# Patient Record
Sex: Male | Born: 1965 | Race: White | Hispanic: No | Marital: Married | Smoking: Never smoker
Health system: Southern US, Community
[De-identification: ages and names within clinical notes are randomized; demographics above are authoritative.]

## PROBLEM LIST (undated history)

## (undated) DIAGNOSIS — E349 Endocrine disorder, unspecified: Secondary | ICD-10-CM

## (undated) DIAGNOSIS — M542 Cervicalgia: Secondary | ICD-10-CM

## (undated) DIAGNOSIS — M940 Chondrocostal junction syndrome [Tietze]: Secondary | ICD-10-CM

## (undated) DIAGNOSIS — M2550 Pain in unspecified joint: Secondary | ICD-10-CM

## (undated) DIAGNOSIS — M25519 Pain in unspecified shoulder: Principal | ICD-10-CM

## (undated) DIAGNOSIS — E785 Hyperlipidemia, unspecified: Secondary | ICD-10-CM

## (undated) DIAGNOSIS — K760 Fatty (change of) liver, not elsewhere classified: Secondary | ICD-10-CM

## (undated) DIAGNOSIS — G709 Myoneural disorder, unspecified: Secondary | ICD-10-CM

## (undated) DIAGNOSIS — R1013 Epigastric pain: Secondary | ICD-10-CM

## (undated) DIAGNOSIS — N200 Calculus of kidney: Secondary | ICD-10-CM

## (undated) DIAGNOSIS — M5030 Other cervical disc degeneration, unspecified cervical region: Secondary | ICD-10-CM

## (undated) DIAGNOSIS — R945 Abnormal results of liver function studies: Secondary | ICD-10-CM

## (undated) DIAGNOSIS — J310 Chronic rhinitis: Secondary | ICD-10-CM

## (undated) DIAGNOSIS — R0789 Other chest pain: Secondary | ICD-10-CM

## (undated) DIAGNOSIS — F438 Other reactions to severe stress: Secondary | ICD-10-CM

## (undated) DIAGNOSIS — R739 Hyperglycemia, unspecified: Secondary | ICD-10-CM

## (undated) DIAGNOSIS — R5383 Other fatigue: Secondary | ICD-10-CM

## (undated) DIAGNOSIS — T7840XA Allergy, unspecified, initial encounter: Secondary | ICD-10-CM

## (undated) DIAGNOSIS — R030 Elevated blood-pressure reading, without diagnosis of hypertension: Secondary | ICD-10-CM

## (undated) DIAGNOSIS — Z87442 Personal history of urinary calculi: Secondary | ICD-10-CM

## (undated) DIAGNOSIS — K219 Gastro-esophageal reflux disease without esophagitis: Secondary | ICD-10-CM

## (undated) DIAGNOSIS — R209 Unspecified disturbances of skin sensation: Secondary | ICD-10-CM

## (undated) DIAGNOSIS — Z0000 Encounter for general adult medical examination without abnormal findings: Secondary | ICD-10-CM

## (undated) HISTORY — DX: Chronic rhinitis: J31.0

## (undated) HISTORY — DX: Elevated blood-pressure reading, without diagnosis of hypertension: R03.0

## (undated) HISTORY — DX: Cervicalgia: M54.2

## (undated) HISTORY — DX: Pain in unspecified shoulder: M25.519

## (undated) HISTORY — DX: Calculus of kidney: N20.0

## (undated) HISTORY — DX: Endocrine disorder, unspecified: E34.9

## (undated) HISTORY — DX: Other chest pain: R07.89

## (undated) HISTORY — DX: Unspecified disturbances of skin sensation: R20.9

## (undated) HISTORY — DX: Chondrocostal junction syndrome (tietze): M94.0

## (undated) HISTORY — DX: Encounter for general adult medical examination without abnormal findings: Z00.00

## (undated) HISTORY — DX: Abnormal results of liver function studies: R94.5

## (undated) HISTORY — DX: Hyperglycemia, unspecified: R73.9

## (undated) HISTORY — PX: UPPER GASTROINTESTINAL ENDOSCOPY: SHX188

## (undated) HISTORY — DX: Epigastric pain: R10.13

## (undated) HISTORY — DX: Myoneural disorder, unspecified: G70.9

## (undated) HISTORY — DX: Hyperlipidemia, unspecified: E78.5

## (undated) HISTORY — DX: Fatty (change of) liver, not elsewhere classified: K76.0

## (undated) HISTORY — DX: Pain in unspecified joint: M25.50

## (undated) HISTORY — DX: Other cervical disc degeneration, unspecified cervical region: M50.30

## (undated) HISTORY — DX: Other fatigue: R53.83

## (undated) HISTORY — DX: Other reactions to severe stress: F43.8

## (undated) HISTORY — DX: Gastro-esophageal reflux disease without esophagitis: K21.9

## (undated) HISTORY — DX: Allergy, unspecified, initial encounter: T78.40XA

## (undated) HISTORY — PX: CARDIAC CATHETERIZATION: SHX172

---

## 2004-08-29 ENCOUNTER — Ambulatory Visit: Payer: Self-pay | Admitting: Family Medicine

## 2005-10-15 ENCOUNTER — Ambulatory Visit: Payer: Self-pay | Admitting: Family Medicine

## 2008-05-04 ENCOUNTER — Ambulatory Visit: Payer: Self-pay | Admitting: Diagnostic Radiology

## 2008-05-04 ENCOUNTER — Emergency Department (HOSPITAL_BASED_OUTPATIENT_CLINIC_OR_DEPARTMENT_OTHER): Admission: EM | Admit: 2008-05-04 | Discharge: 2008-05-04 | Payer: Self-pay | Admitting: Emergency Medicine

## 2008-05-04 DIAGNOSIS — R0789 Other chest pain: Secondary | ICD-10-CM

## 2008-05-04 HISTORY — DX: Other chest pain: R07.89

## 2008-05-18 ENCOUNTER — Ambulatory Visit: Payer: Self-pay | Admitting: Family Medicine

## 2008-05-22 LAB — CONVERTED CEMR LAB
AST: 31 units/L (ref 0–37)
Alkaline Phosphatase: 55 units/L (ref 39–117)
Total Bilirubin: 1.3 mg/dL — ABNORMAL HIGH (ref 0.3–1.2)
Total CHOL/HDL Ratio: 5
VLDL: 44.6 mg/dL — ABNORMAL HIGH (ref 0.0–40.0)

## 2008-07-12 DIAGNOSIS — M5030 Other cervical disc degeneration, unspecified cervical region: Secondary | ICD-10-CM | POA: Insufficient documentation

## 2008-07-12 HISTORY — DX: Other cervical disc degeneration, unspecified cervical region: M50.30

## 2008-07-17 ENCOUNTER — Ambulatory Visit: Payer: Self-pay | Admitting: Internal Medicine

## 2008-07-17 DIAGNOSIS — M25519 Pain in unspecified shoulder: Secondary | ICD-10-CM

## 2008-07-17 HISTORY — DX: Pain in unspecified shoulder: M25.519

## 2008-07-24 ENCOUNTER — Ambulatory Visit: Payer: Self-pay | Admitting: Family Medicine

## 2008-07-24 ENCOUNTER — Telehealth: Payer: Self-pay | Admitting: Family Medicine

## 2008-07-28 ENCOUNTER — Ambulatory Visit: Payer: Self-pay | Admitting: Family Medicine

## 2008-08-02 ENCOUNTER — Encounter: Payer: Self-pay | Admitting: Family Medicine

## 2008-08-02 ENCOUNTER — Encounter: Admission: RE | Admit: 2008-08-02 | Discharge: 2008-08-28 | Payer: Self-pay | Admitting: Family Medicine

## 2009-02-27 ENCOUNTER — Emergency Department (HOSPITAL_BASED_OUTPATIENT_CLINIC_OR_DEPARTMENT_OTHER): Admission: EM | Admit: 2009-02-27 | Discharge: 2009-02-27 | Payer: Self-pay | Admitting: Emergency Medicine

## 2009-02-27 ENCOUNTER — Ambulatory Visit: Payer: Self-pay | Admitting: Diagnostic Radiology

## 2009-06-19 ENCOUNTER — Telehealth: Payer: Self-pay | Admitting: Family Medicine

## 2009-09-15 ENCOUNTER — Encounter: Payer: Self-pay | Admitting: Emergency Medicine

## 2009-09-15 ENCOUNTER — Ambulatory Visit: Payer: Self-pay | Admitting: Diagnostic Radiology

## 2009-09-15 ENCOUNTER — Observation Stay (HOSPITAL_COMMUNITY): Admission: EM | Admit: 2009-09-15 | Discharge: 2009-09-16 | Payer: Self-pay | Admitting: Internal Medicine

## 2009-09-17 ENCOUNTER — Ambulatory Visit: Payer: Self-pay | Admitting: Family Medicine

## 2009-09-17 DIAGNOSIS — R079 Chest pain, unspecified: Secondary | ICD-10-CM

## 2009-09-21 ENCOUNTER — Telehealth: Payer: Self-pay | Admitting: Family Medicine

## 2009-09-26 ENCOUNTER — Ambulatory Visit: Payer: Self-pay | Admitting: Family Medicine

## 2009-09-26 ENCOUNTER — Telehealth: Payer: Self-pay | Admitting: Family Medicine

## 2009-09-26 DIAGNOSIS — F4389 Other reactions to severe stress: Secondary | ICD-10-CM

## 2009-09-26 DIAGNOSIS — J310 Chronic rhinitis: Secondary | ICD-10-CM | POA: Insufficient documentation

## 2009-09-26 DIAGNOSIS — M542 Cervicalgia: Secondary | ICD-10-CM

## 2009-09-26 DIAGNOSIS — F438 Other reactions to severe stress: Secondary | ICD-10-CM

## 2009-09-26 DIAGNOSIS — R1013 Epigastric pain: Secondary | ICD-10-CM

## 2009-09-26 HISTORY — DX: Other reactions to severe stress: F43.89

## 2009-09-26 HISTORY — DX: Chronic rhinitis: J31.0

## 2009-09-26 HISTORY — DX: Cervicalgia: M54.2

## 2009-09-26 HISTORY — DX: Epigastric pain: R10.13

## 2009-09-26 HISTORY — DX: Other reactions to severe stress: F43.8

## 2009-09-27 ENCOUNTER — Telehealth: Payer: Self-pay | Admitting: Family Medicine

## 2009-10-04 ENCOUNTER — Ambulatory Visit: Payer: Self-pay

## 2009-10-04 ENCOUNTER — Telehealth: Payer: Self-pay | Admitting: Family Medicine

## 2009-10-04 ENCOUNTER — Ambulatory Visit: Payer: Self-pay | Admitting: Cardiovascular Disease

## 2009-10-05 ENCOUNTER — Encounter: Payer: Self-pay | Admitting: Family Medicine

## 2009-10-08 ENCOUNTER — Encounter (INDEPENDENT_AMBULATORY_CARE_PROVIDER_SITE_OTHER): Payer: Self-pay | Admitting: *Deleted

## 2009-10-15 ENCOUNTER — Ambulatory Visit: Payer: Self-pay | Admitting: Family Medicine

## 2009-10-16 ENCOUNTER — Telehealth: Payer: Self-pay | Admitting: Gastroenterology

## 2009-10-25 ENCOUNTER — Telehealth: Payer: Self-pay | Admitting: Family Medicine

## 2009-10-26 ENCOUNTER — Encounter (INDEPENDENT_AMBULATORY_CARE_PROVIDER_SITE_OTHER): Payer: Self-pay | Admitting: *Deleted

## 2009-10-26 ENCOUNTER — Ambulatory Visit: Payer: Self-pay | Admitting: Gastroenterology

## 2009-10-26 ENCOUNTER — Telehealth: Payer: Self-pay | Admitting: Gastroenterology

## 2009-10-31 ENCOUNTER — Ambulatory Visit: Payer: Self-pay | Admitting: Gastroenterology

## 2009-11-01 ENCOUNTER — Ambulatory Visit (HOSPITAL_COMMUNITY): Admission: RE | Admit: 2009-11-01 | Discharge: 2009-11-01 | Payer: Self-pay | Admitting: Gastroenterology

## 2009-11-02 ENCOUNTER — Encounter: Payer: Self-pay | Admitting: Gastroenterology

## 2009-11-03 ENCOUNTER — Emergency Department (HOSPITAL_BASED_OUTPATIENT_CLINIC_OR_DEPARTMENT_OTHER): Admission: EM | Admit: 2009-11-03 | Discharge: 2009-11-03 | Payer: Self-pay | Admitting: Emergency Medicine

## 2009-11-03 ENCOUNTER — Ambulatory Visit: Payer: Self-pay | Admitting: Diagnostic Radiology

## 2009-11-06 ENCOUNTER — Telehealth (INDEPENDENT_AMBULATORY_CARE_PROVIDER_SITE_OTHER): Payer: Self-pay | Admitting: *Deleted

## 2009-11-07 ENCOUNTER — Telehealth: Payer: Self-pay | Admitting: Gastroenterology

## 2009-11-07 ENCOUNTER — Telehealth (INDEPENDENT_AMBULATORY_CARE_PROVIDER_SITE_OTHER): Payer: Self-pay | Admitting: *Deleted

## 2009-11-07 ENCOUNTER — Encounter (INDEPENDENT_AMBULATORY_CARE_PROVIDER_SITE_OTHER): Payer: Self-pay | Admitting: *Deleted

## 2009-11-08 ENCOUNTER — Ambulatory Visit: Payer: Self-pay | Admitting: Family Medicine

## 2009-11-22 ENCOUNTER — Telehealth: Payer: Self-pay | Admitting: Family Medicine

## 2009-11-22 DIAGNOSIS — R209 Unspecified disturbances of skin sensation: Secondary | ICD-10-CM | POA: Insufficient documentation

## 2009-11-22 HISTORY — DX: Unspecified disturbances of skin sensation: R20.9

## 2009-11-26 ENCOUNTER — Ambulatory Visit: Payer: Self-pay | Admitting: Psychology

## 2009-12-13 ENCOUNTER — Ambulatory Visit: Payer: Self-pay | Admitting: Psychology

## 2009-12-17 ENCOUNTER — Telehealth: Payer: Self-pay | Admitting: Cardiovascular Disease

## 2010-01-01 ENCOUNTER — Ambulatory Visit: Payer: Self-pay | Admitting: Cardiovascular Disease

## 2010-01-07 ENCOUNTER — Telehealth: Payer: Self-pay | Admitting: Family Medicine

## 2010-01-28 ENCOUNTER — Ambulatory Visit: Payer: Self-pay | Admitting: Family Medicine

## 2010-01-29 ENCOUNTER — Telehealth: Payer: Self-pay | Admitting: Family Medicine

## 2010-01-29 ENCOUNTER — Encounter: Payer: Self-pay | Admitting: Cardiovascular Disease

## 2010-01-30 ENCOUNTER — Encounter: Payer: Self-pay | Admitting: Family Medicine

## 2010-01-30 ENCOUNTER — Encounter: Payer: Self-pay | Admitting: Cardiovascular Disease

## 2010-01-31 LAB — CONVERTED CEMR LAB
Chloride: 99 meq/L (ref 96–112)
Creatinine, Ser: 1.01 mg/dL (ref 0.40–1.50)
INR: 1.09 (ref <1.50)
MCHC: 33.4 g/dL (ref 30.0–36.0)
Prothrombin Time: 14.3 s (ref 11.6–15.2)
RBC: 5.03 M/uL (ref 4.22–5.81)
WBC: 4.9 10*3/uL (ref 4.0–10.5)

## 2010-02-01 ENCOUNTER — Encounter: Payer: Self-pay | Admitting: Family Medicine

## 2010-02-01 ENCOUNTER — Encounter: Payer: Self-pay | Admitting: Cardiology

## 2010-02-04 ENCOUNTER — Telehealth (INDEPENDENT_AMBULATORY_CARE_PROVIDER_SITE_OTHER): Payer: Self-pay | Admitting: *Deleted

## 2010-02-05 ENCOUNTER — Telehealth: Payer: Self-pay | Admitting: Cardiovascular Disease

## 2010-02-13 ENCOUNTER — Encounter
Admission: RE | Admit: 2010-02-13 | Discharge: 2010-02-13 | Payer: Self-pay | Source: Home / Self Care | Attending: Neurology | Admitting: Neurology

## 2010-03-05 ENCOUNTER — Ambulatory Visit
Admission: RE | Admit: 2010-03-05 | Discharge: 2010-03-05 | Payer: Self-pay | Source: Home / Self Care | Attending: Family Medicine | Admitting: Family Medicine

## 2010-03-08 ENCOUNTER — Telehealth: Payer: Self-pay | Admitting: Cardiovascular Disease

## 2010-03-08 ENCOUNTER — Encounter: Payer: Self-pay | Admitting: Cardiovascular Disease

## 2010-03-12 ENCOUNTER — Encounter: Payer: Self-pay | Admitting: Family Medicine

## 2010-03-12 ENCOUNTER — Ambulatory Visit
Admission: RE | Admit: 2010-03-12 | Payer: Self-pay | Source: Home / Self Care | Attending: Cardiovascular Disease | Admitting: Cardiovascular Disease

## 2010-03-12 LAB — CONVERTED CEMR LAB
CO2: 29 meq/L (ref 19–32)
Chloride: 103 meq/L (ref 96–112)
Creatinine, Ser: 1.3 mg/dL (ref 0.40–1.50)
Hemoglobin: 14.9 g/dL (ref 13.0–17.0)
INR: 0.96 (ref <1.50)
MCHC: 34.9 g/dL (ref 30.0–36.0)
Potassium: 4.5 meq/L (ref 3.5–5.3)
RBC: 4.66 M/uL (ref 4.22–5.81)
Sodium: 141 meq/L (ref 135–145)
WBC: 5.3 10*3/uL (ref 4.0–10.5)

## 2010-03-14 ENCOUNTER — Encounter: Payer: Self-pay | Admitting: Family Medicine

## 2010-03-18 ENCOUNTER — Telehealth: Payer: Self-pay | Admitting: Cardiovascular Disease

## 2010-03-19 ENCOUNTER — Telehealth: Payer: Self-pay | Admitting: Cardiovascular Disease

## 2010-03-31 LAB — CONVERTED CEMR LAB
Basophils Relative: 0.9 % (ref 0.0–3.0)
Eosinophils Absolute: 0 10*3/uL (ref 0.0–0.7)
Eosinophils Relative: 0.8 % (ref 0.0–5.0)
H Pylori IgG: NEGATIVE
Lymphocytes Relative: 23.9 % (ref 12.0–46.0)
Neutrophils Relative %: 69 % (ref 43.0–77.0)
RBC: 4.32 M/uL (ref 4.22–5.81)
Sed Rate: 13 mm/hr (ref 0–22)
WBC: 5.8 10*3/uL (ref 4.5–10.5)

## 2010-04-02 NOTE — Assessment & Plan Note (Signed)
Summary: jaw pain and recent chest pain/dm   Vital Signs:  Patient profile:   45 year old male Height:      68 inches (172.72 cm) Weight:      210.31 pounds (95.60 kg) BMI:     32.09 O2 Sat:      96 % on Room air Temp:     98.8 degrees F (37.11 degrees C) oral Pulse rate:   91 / minute BP sitting:   128 / 90  (left arm) Cuff size:   regular  Vitals Entered By: Josph Macho RMA (September 26, 2009 3:00 PM)  O2 Flow:  Room air CC: chest discomfort radiates to sides then back, tingling in left arm and hand, Jaw pain (on left side) coming and going since Friday/ CF Is Patient Diabetic? No   History of Present Illness: Patient in today for reevaluation of atypical CP with pain at times into left jaw and left arm. Sense of globus in his throat and some mild congestion is noted. Has been having some coughing/sneezing fits. He has been to the hospital and in here to be seen over the past few weeks with other symptoms as well. He is very anxious and aware that he is worrying obsessively about every little symptom. He came in here with c/o upper abdominal/low chest wall pain. He reports the pain in chest wall is central to left sided, started after a sneezing fit and can come and go. He does not have associated symptoms simultaneously. He denies palpitations/SOB (although at times he has trouble drawing a full breath when he is worrying). He had an episode over a week ago where he felt a heaviness in his LUQ with some assoicated nausea and light headedness. It was a hot day and he had not eaten well. He ate and rested and then felt better so he went and played softball like he normally would and did fine. 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 He had cardiac enzymes and EKG studies done at [facility] which were all completely  normal. Patient is now worried about the fact that his Dad died at age 22 of a sudden heart attack and his Paternal uncle died at 28 years of age of an MI bother were smokers.  His other set of symptoms are musculoskeletal and related to some neck pain and bone spurs in his neck that have been giving trouble off and on for the past year. He has needed to be treated several times for spasm and neck pain with radicular symptoms down his left arm and he is having some of those symptoms. No fevers/chills/GI symptoms. He acknowledges he is worrying constantly about his health, his kids and is having trouble sleeping. HE   Current Medications (verified): 1)  Nitrostat 0.4 Mg Subl (Nitroglycerin) .Marland Kitchen.. 1 Tab Sl As Needed Cp If No Resolution After 5 Minutes Repeat, After 3 Doses If Not Resolution To Er  Allergies (verified): No Known Drug Allergies  Past History:  Past medical history reviewed for relevance to current acute and chronic problems. Social history (including risk factors) reviewed for relevance to current acute and chronic problems.  Social History: Reviewed history from 09/24/2006 and no changes required. Never Smoked Alcohol use-yes Drug use-no Married Regular exercise-no  Review of Systems  See HPI  Physical Exam  General:  Well-developed,well-nourished,in no acute distress; alert,appropriate and cooperative throughout examination Head:  Normocephalic and atraumatic without obvious abnormalities. Eyes:  No corneal or conjunctival inflammation noted. EOMI.  Ears:  External ear exam shows no significant lesions or deformities.  Otoscopic examination reveals clear canals, tympanic membranes are intact bilaterally without bulging, retraction, inflammation or discharge. Hearing is grossly normal bilaterally. Nose:  External nasal examination shows no deformity or inflammation. Nasal mucosa are pink and moist without lesions or exudates. Mouth:  Oral mucosa and oropharynx without  lesions or exudates.  Teeth in good repair. Neck:  No deformities, masses, or tenderness noted. Lungs:  Normal respiratory effort, chest expands symmetrically. Lungs are clear to auscultation, no crackles or wheezes. Heart:  Normal rate and regular rhythm. S1 and S2 normal without gallop, murmur, click, rub or other extra sounds. Abdomen:  Bowel sounds positive,abdomen soft and non-tender without masses, organomegaly or hernias noted. Msk:  No deformity or scoliosis noted of thoracic or lumbar spine.  Muscle spasm noted over b/l SCM muscles in neck L>R, but FROM maintained in neck and arms Pulses:  R and L carotid,radial,femoral,dorsalis pedis and posterior tibial pulses are full and equal bilaterally Extremities:  No clubbing, cyanosis, edema, or deformity noted with normal full range of motion of all joints.   Neurologic:  No cranial nerve deficits noted. Station and gait are normal. Plantar reflexes are down-going bilaterally. DTRs are symmetrical throughout. Sensory, motor and coordinative functions appear intact. Skin:  Intact without suspicious lesions or rashes Cervical Nodes:  No lymphadenopathy noted Psych:  Oriented X3, memory intact for recent and remote, normally interactive, and good eye contact.  Anxious   Impression & Recommendations:  Problem # 1:  CHEST PAIN (ICD-786.50)  Orders: TLB-CBC Platelet - w/Differential (85025-CBCD) TLB-TSH (Thyroid Stimulating Hormone) (84443-TSH) Atypical CP likely multifactorial and related to costochondritis and possibly GI. With that strong FH is encouraged to proceed with cardiac stress testing and referred to a Cardiologist for further consultation. He is asked to take a ECASA 81mg  daily until his work up is complete. If he has chest pain/pressure that is unremitting and especially if it has any associated symptoms such as SOB/palp/nausea/diaphoresis he is given SL NTG to use as directed. If the pain persists after 3 doses he is to go to the ER  for further evaluation. He is asked to avoid any heavy lifting or strenuous activity until his work up is complete.  Problem # 2:  NECK PAIN (ICD-723.1)  The following medications were removed from the medication list:    Aspirin 81 Mg Tabs (Aspirin) ..... Once daily He has long history may use some NSAIDs as needed infrequently alternate ice and heat and is given some Diazepam as a muscle relaxant to try if the neck and arm pain worsens.  Problem # 3:  CHRONIC RHINITIS (ICD-472.0) mild symptoms but if they worsen may want to try some Claritin OTC for a few days  Problem # 4:  ABDOMINAL PAIN, EPIGASTRIC (ICD-789.06)  Orders: TLB-Sedimentation Rate (ESR) (85652-ESR) TLB-H. Pylori Abs(Helicobacter Pylori) (86677-HELICO) Venipuncture (25427) Specimen Handling (06237) Start Ranitidine 150mg  once daily, eat a bland lowfat diet and use Maalox as needed if symptoms worsen  Problem # 5:  OTHER ACUTE REACTIONS TO STRESS (ICD-308.3) Patient very anxious secondary to his vague and varied symptoms and his family history. He is offered some reassurance today that his symptoms do not present as classically cardiac but also warned not to dismiss symptoms  or to stubbornly avoid the ER because he is worried about the money, time or hassle. He is to try and use the Diazepam as needed for anxiety, insomnia or pain to see if this helps. He expresses understanding and chooses to go home and proceed with out patient work up unless his symptoms change or worsen. If this occurs he will proceed to the ER, Time spent with the patient is greater than 1 hour. Time counselling is greater than 45 minutes.  Complete Medication List: 1)  Nitrostat 0.4 Mg Subl (Nitroglycerin) .Marland Kitchen.. 1 tab sl as needed cp if no resolution after 5 minutes repeat, after 3 doses if not resolution to er 2)  Diazepam 5 Mg Tabs (Diazepam) .Marland Kitchen.. 1 tab by mouth two times a day as needed anxiety/pain/insomnia  Other Orders: Cardiology Referral  (Cardiology)  Patient Instructions: 1)  Please schedule a follow-up appointment as needed .  2)  Avoid any heavy lifting or strenuous activity until stress testing complete. 3)  Use SL NTG for CP that is unremitting especially if it is associated with SOB/nausea/sweatiness or palpitations. chew 4 baby aspirin 4)  Avoid caffeine. 5)  Use Diazepam for stress/pain/insomnia. 6)  Take Ranitidine 150mg  daily until lab results and stress test available. 7)  Take Aspirin 81mg  once daily 8)  Keep Maalox in house take a dose if burning in the chest or dyspepsia develops Prescriptions: DIAZEPAM 5 MG TABS (DIAZEPAM) 1 tab by mouth two times a day as needed anxiety/pain/insomnia  #30 x 1   Entered and Authorized by:   Danise Edge MD   Signed by:   Danise Edge MD on 09/26/2009   Method used:   Print then Give to Patient   RxID:   [id number] NITROSTAT 0.4 MG SUBL (NITROGLYCERIN) 1 tab sl as needed CP if no resolution after 5 minutes repeat, after 3 doses if not resolution to ER  #25 x 1   Entered and Authorized by:   Danise Edge MD   Signed by:   Danise Edge MD on 09/26/2009   Method used:   Electronically to        [facility]* (retail)             [place], [place]         Ph: [phone]       Fax: [phone]   RxID:   [id number]

## 2010-04-02 NOTE — Progress Notes (Signed)
Summary: PT HAS QUESTIONS ABOUT ED VISIT AND POSSIBLE CATH  Phone Note Call from Patient Call back at Work Phone [phone]   Caller: PT Reason for Call: Talk to Nurse Summary of Call: PT WAS SEEN IN ED OVER WEEKEND FOR CHEST PAIN AND HAD A CT WITH DYE DONE. WHEN HE WAS DISCHARGED HE WAS TOLD THAT HE NEEDED TO HAVE A CARDIAC CATH. Initial call taken by: Faythe Ghee,  November 06, 2009 3:54 PM  Follow-up for Phone Call        Pt was recently in ED Sept.3,11 and was ruled out for MI.  He had played softball earlier and was hit in the ankle with a line drive then his 17yr old son fell off the top of the bleachers.  His pectoral muscles per patients were aching and bp up.  He went to the ED after midnight.  He is seeing Dr. Tawanna Cooler tomorrow but is not sure if the  next step to the now "6 week  muscle discomfort" might be to have a cath.  He has had a stress test 10/04/09 with Dr. Clifton Delani Kohli reading.  He wants to know what Dr. Clifton Janavia Rottman thoughts are.  I will send this to Dr. Clifton Micheil Klaus & Leotis Shames. Mylo Red RN     Appended Document: PT HAS QUESTIONS ABOUT ED VISIT AND POSSIBLE CATH He is seeing Dr. Tawanna Cooler tomorrow. CT angiogram in ER was negative for PE. He has had chest pain. I have never seen him as a patient. He was sent in for a treadmill stress test which I supervised and it was normal. I will be glad to see him if he needs to be seen. Can we call him and let him to know to call us if he needs to be seen as a new patient or if Dr. Tawanna Cooler wants [place] to see him. thanks, chris  Appended Document: PT HAS QUESTIONS ABOUT ED VISIT AND POSSIBLE CATH Spoke with pt's wife and she will call us after his appt. with Dr. Tawanna Cooler to schedule an appt. with Dr. Clifton Ashani Pumphrey if necessary

## 2010-04-02 NOTE — Assessment & Plan Note (Signed)
Summary: 3 month fu/dt   Vital Signs:  Patient profile:   45 year old male Height:      68 inches (172.72 cm) Weight:      199.50 pounds (90.68 kg) O2 Sat:      100 % on Room air Temp:     97.8 degrees F (36.56 degrees C) oral Pulse rate:   72 / minute BP sitting:   136 / 85  (left arm) Cuff size:   regular  Vitals Entered By: Josph Macho RMA (January 28, 2010 3:52 PM)  O2 Flow:  Room air CC: 3 month follow up/ CF Is Patient Diabetic? No   History of Present Illness: Patient is 45 yo caucasian male in today for evaluation of multiple concerns. He he struggled with atypical chest pain. The pain is largely substernal and just to the left of the sternum and then will at times radiate to the left shoulder around the left can be improved and worsened by changes of position. He has tried multiple modalities to evaluate this pain is gone through gastroenterology and his upper endoscopy was on the Feldene had a normal stress test and offered a cardiac catheter for has not proceeded. Does not have the associated symptoms of palpitations, shortness of breath, nausea or diaphoresis with this chest pain. Says at times with substernal chest discomfort can radiate around bilateral lower meds he is going to massage the year he is in deep tissue massage in the anterior chest wall has been uncomfortable but also helpful. He has been some inflammation and swelling at times at the left sternal border for the patient. His acupuncture, chiropractic care and none of these have helped tremendously except for the massage therapy. A new development has been some paresthesias that are intermittent and diffuse. He says he'll get left base greater than right face tingling then at other times he'll get some arm tingling right greater than left with some leg tingling left greater than right he is in here to have it randomly they're not altered by changes in position. Is on some low-grade headaches but nothing  debilitating. He's had some episodes of his vision being blurry only temporarily may try 3. An episode of viral gastroenteritis earlier in the month but he had fever loose stool and vomiting for 24 hours which resolved. The rest of his symptoms have been ongoing for roughly 4 months. He is seeing Dr. Lesia Sago of [facility] neurology ordered an MRI to evaluate for MS another neurologic concerns. Previous neck x-ray did show some degenerative disc disease with potential serious disease on the left. His stress level is tolerable and not worse.   Current Medications (verified): 1)  Nitrostat 0.4 Mg Subl (Nitroglycerin) .Marland Kitchen.. 1 Tab Sl As Needed Cp If No Resolution After 5 Minutes Repeat, After 3 Doses If Not Resolution To Er 2)  Diazepam 5 Mg Tabs (Diazepam) .Marland Kitchen.. 1 Tab By Mouth Two Times A Day As Needed Anxiety/pain/insomnia 3)  Ultram 50 Mg Tabs (Tramadol Hcl) .... One By Mouth Two Times A Day As Needed For Pain 4)  Maalox Plus 225-200-25 Mg/58ml Susp (Alum & Mag Hydroxide-Simeth) .... 2 Teaspoons As Needed 5)  Clidinium-Chlordiazepoxide 2.5-5 Mg Caps (Clidinium-Chlordiazepoxide) .Marland Kitchen.. 1 By Mouth Three Times A Day As Needed 6)  Fish Oil   Oil (Fish Oil) .... 1000mg  2 Two Times A Day (When Remembers)  Allergies (verified): No Known Drug Allergies  Past History:  Past medical history reviewed for relevance to current acute and chronic  problems. Social history (including risk factors) reviewed for relevance to current acute and chronic problems.  Past Medical History: Reviewed history from 01/01/2010 and no changes required. Kidney Stones ? GERD Chest Wall Pain  Social History: Reviewed history from 01/01/2010 and no changes required. Never Smoked Alcohol use-yes-1-2 x per wek Drug use-no Married, 2 children Regular exercise-no Daily Caffeine Use-1  cup daily  Review of Systems      See HPI  Physical Exam  General:  Well-developed,well-nourished,in no acute distress; alert,appropriate  and cooperative throughout examination Head:  Normocephalic and atraumatic without obvious abnormalities. No apparent alopecia or balding. Eyes:  No corneal or conjunctival inflammation noted. EOMI. Perrla. Funduscopic exam benign, without hemorrhages, exudates or papilledema. Vision grossly normal. Ears:  External ear exam shows no significant lesions or deformities.  Otoscopic examination reveals clear canals, tympanic membranes are intact bilaterally without bulging, retraction, inflammation or discharge. Hearing is grossly normal bilaterally. Nose:  External nasal examination shows no deformity or inflammation. Nasal mucosa are pink and moist without lesions or exudates. Mouth:  Oral mucosa and oropharynx without lesions or exudates.  Teeth in good repair, pharyngeal erythema.   Neck:  No deformities, masses, or tenderness noted. Chest Wall:  No deformities, masses, tenderness or gynecomastia noted. Lungs:  Normal respiratory effort, chest expands symmetrically. Lungs are clear to auscultation, no crackles or wheezes. Heart:  Normal rate and regular rhythm. S1 and S2 normal without gallop, murmur, click, rub or other extra sounds. Abdomen:  Bowel sounds positive,abdomen soft and non-tender without masses, organomegaly or hernias noted. Msk:  No deformity or scoliosis noted of thoracic or lumbar spine.   Extremities:  No clubbing, cyanosis, edema, or deformity noted  Cervical Nodes:  No lymphadenopathy noted Psych:  Cognition and judgment appear intact. Alert and cooperative with normal attention span and concentration. No apparent delusions, illusions, hallucinations   Impression & Recommendations:  Problem # 1:  CHEST PAIN, ATYPICAL (ICD-786.59) So far cardiac and GI work up is unremarkable, due to the persistent substernal to left sided CP he has been offered a cardiac cath for complete evaluation but thus far has not chosen to proceed with this option.   Problem # 2:  DEGENERATIVE DISC  DISEASE, CERVICAL SPINE (ICD-722.4) Is now seeing Neurology and is dealing with some left sided CP which radiates to left shoulder and back there is some concern that with this as well as some newly developed diffuse paresthesias. They are in the process of ordering a MRI to rule out MS and other neurologic concerns and then will likely proceed with some lab work if need be we can facilitate the ordering of some labs as well  Problem # 3:  OTHER ACUTE REACTIONS TO STRESS (ICD-308.3) Has Diazepam to use as needed and is using very infrequently, he continues to struggle with work stress but feels he has good insight into this and good coping skills.  Problem # 4:  NECK PAIN (ICD-723.1)  His updated medication list for this problem includes:    Ultram 50 Mg Tabs (Tramadol hcl) ..... One by mouth two times a day as needed for pain    Cyclobenzaprine Hcl 10 Mg Tabs (Cyclobenzaprine hcl) .Marland Kitchen... 1/2 to 1 tab by mouth at bedtime May try the Cyclobenzaprine at bedtime to help with muscle spasm  Complete Medication List: 1)  Nitrostat 0.4 Mg Subl (Nitroglycerin) .Marland Kitchen.. 1 tab sl as needed cp if no resolution after 5 minutes repeat, after 3 doses if not resolution to er 2)  Diazepam 5 Mg Tabs (Diazepam) .Marland Kitchen.. 1 tab by mouth two times a day as needed anxiety/pain/insomnia 3)  Ultram 50 Mg Tabs (Tramadol hcl) .... One by mouth two times a day as needed for pain 4)  Maalox Plus 225-200-25 Mg/71ml Susp (Alum & mag hydroxide-simeth) .... 2 teaspoons as needed 5)  Clidinium-chlordiazepoxide 2.5-5 Mg Caps (Clidinium-chlordiazepoxide) .Marland Kitchen.. 1 by mouth three times a day as needed 6)  Fish Oil Oil (Fish oil) .... 1000mg  2 two times a day (when remembers) 7)  Cyclobenzaprine Hcl 10 Mg Tabs (Cyclobenzaprine hcl) .... 1/2 to 1 tab by mouth at bedtime 8)  Aspercreme/aloe 10 % Crea (Trolamine salicylate) .... Apply to chest wall as needed for pain  Patient Instructions: 1)  The main problem with gastroentereritis is  dehydration. Drink plenty of fluids and take solids as you feel better. If you are unable to keep anything down and/or you show signs of dehydration( dry cracked lips, lack of tears, not urinating, very sleepy) , call our office.  2)  Watch spicy, acidic, fatty and large meals especially in evening 3)  Try Pepcid/Famotidine in eve if need be 4)  Please schedule a follow-up appointment in 1 month.  Prescriptions: CYCLOBENZAPRINE HCL 10 MG TABS (CYCLOBENZAPRINE HCL) 1/2 to 1 tab by mouth at bedtime  #30 x 1   Entered and Authorized by:   Danise Edge MD   Signed by:   Danise Edge MD on 01/28/2010   Method used:   Electronically to        [facility] # [phone]* (retail)       [address]       [place], [place]  [address]       Ph: [phone]       Fax: [phone]   RxID:   [id number]    Orders Added: 1)  Est. Patient Level IV [60109]

## 2010-04-02 NOTE — Progress Notes (Signed)
Summary: discuss heart cath  Phone Note Call from Patient Call back at Home Phone [phone] Call back at Work Phone [phone]   Caller: Patient Reason for Call: Talk to Nurse Details for Reason: Discuss heart cath. Initial call taken by: Lorne Skeens,  December 17, 2009 11:53 AM  Follow-up for Phone Call        Pt. still having CP with activity & with rest. He is rating it around 3-4 but sometimes 7/10. He is wondering if he should have a cardiac cath. Seems very stressed right now. He has been having CP on & off since July, 2011. Had gxt in September which was negative. He was recently in the ER in September for CP & ER doctors had suggested a cath to determine source of CP. His primary does not feel that this is necessary.?? Pt. wants to know if he should have cardiac cath since he is still having CP. Will forward to [facility] to see if he wants to see him in the office. Whitney Maeola Sarah RN  December 17, 2009 12:46 PM  Appt made for next week with [facility]. Whitney Maeola Sarah RN  December 18, 2009 11:19 AM   Follow-up by: Whitney Maeola Sarah RN,  December 17, 2009 12:42 PM  Additional Follow-up for Phone Call Additional follow up Details #1::        I have not seen him as a paitent. He was seen in our office for his exercise treadmill several months ago. If he still has chest pain, I would be glad to see him as a new patient. Can we call him and see if he needs to be seen? thanks, cdm Additional Follow-up by: Verne Carrow, MD,  December 18, 2009 9:48 AM

## 2010-04-02 NOTE — Assessment & Plan Note (Signed)
Summary: Epigastric Pain/dfs   History of Present Illness Visit Type: Initial Visit Primary Provider: Kelle Darting, MD Chief Complaint: Patient c/o LUQ abdominal pain  as well as midsternal chest discomfort and increased bloating. He denies any nausaea or vomiting. He denies any severe diarrhea but does have occasional loose stools (which he attributes to prilosec) History of Present Illness:   45 year old Caucasian male who is referred by Dr. Tawanna Cooler for evaluation of atypical chest pain. 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 In early July he developed a dull discomfort in his subxiphoid area radiating into the left upper quadrant and back which has been rather persistent despite a ten-day course of Prilosec 20 mg b.i.d. His pain was so bad that he had emergency room evaluation and underwent 24-hour observation, subsequent cardiac stress testing, and a chest x-ray and labs all of which were normal.  He continues with atypical chest pain without true reflux symptoms. He specifically denies regurgitation, dysphagia. Runny hepatobiliary or lower gastrointestinal symptoms. He eats very irregularly and eats a high fat diet. He has not had previous hepatitis or pancreatitis. He does use a large amount of NSAIDs for his neck pain, but denies ethanol or cigarette abuse. He takes currently p.r.n. diazepam and Ultram. If under stress he has abdominal cramping, bloating, and watery stools but no melena or hematochezia. He has not had anemia or abnormal liver enzymes.   GI Review of Systems    Reports abdominal pain, acid reflux, belching, bloating, and  chest pain.     Location of  Abdominal pain: LUQ.    Denies dysphagia with liquids, dysphagia with solids, heartburn, loss of appetite, nausea, vomiting, vomiting blood, weight loss, and  weight gain.        Denies  anal fissure, black tarry stools, change in bowel habit, constipation, diarrhea, diverticulosis, fecal incontinence, heme positive stool, hemorrhoids, irritable bowel syndrome, jaundice, light color stool, liver problems, rectal bleeding, and  rectal pain.    Current Medications (verified): 1)  Nitrostat 0.4 Mg Subl (Nitroglycerin) .Marland Kitchen.. 1 Tab Sl As Needed Cp If No Resolution After 5 Minutes Repeat, After 3 Doses If Not Resolution To Er 2)  Diazepam 5 Mg Tabs (Diazepam) .Marland Kitchen.. 1 Tab By Mouth Two Times A Day As Needed Anxiety/pain/insomnia 3)  Ultram 50 Mg Tabs (Tramadol Hcl) .... One By Mouth Two Times A Day As Needed For Pain 4)  Maalox Plus 225-200-25 Mg/1ml Susp (Alum & Mag Hydroxide-Simeth) .... 2 Teaspoons As Needed 5)  Zantac 150 Mg Tabs (Ranitidine Hcl) .... As Needed 6)  Prilosec Otc 20 Mg Tbec (Omeprazole Magnesium) .... Take 1 Tablet By Mouth As Needed  Allergies (verified): No Known Drug Allergies  Past History:  Past medical, surgical, family and social histories (including risk factors) reviewed for relevance to current acute and chronic problems.  Past Medical History: Kidney Stones GERD Chest Wall Pain  Past Surgical History: unremarkable  Family History: Reviewed history from 09/24/2006 and no changes required. Family History Diabetes 1st degree relative: Mother, Maternal Grandmother, Paternal Grandmother, Grandfather Family History Hypertension FAm hx MI Fam hx IBS Family History of Cardiovascular disorder Family History of Esophageal Cancer: Mudlogger Family History of Stomach Cancer: Uncle No FH of Colon Cancer: Family History of Colon Polyps: Grandmother Family History of Colitis: Maternal Grandmother, Maternal Grandfather Family History of Heart Disease: Maternal  Grandmother, Maternal Grandfather  Social History: Reviewed history from 09/24/2006 and no changes required. Never Smoked Alcohol use-yes-1-2 x per wek Drug use-no Married Regular  exercise-no Daily Caffeine Use-1` cup daily  Review of Systems       The patient complains of anxiety-new, back pain, cough, fatigue, headaches-new, muscle pains/cramps, and sore throat.  The patient denies allergy/sinus, anemia, arthritis/joint pain, blood in urine, breast changes/lumps, change in vision, confusion, coughing up blood, depression-new, fainting, fever, hearing problems, heart murmur, heart rhythm changes, itching, menstrual pain, night sweats, nosebleeds, pregnancy symptoms, shortness of breath, skin rash, sleeping problems, swelling of feet/legs, swollen lymph glands, thirst - excessive , urination - excessive , urination changes/pain, urine leakage, vision changes, and voice change.    Vital Signs:  Patient profile:   45 year old male Height:      68 inches Weight:      198.13 pounds BMI:     30.23 BSA:     2.04 Pulse rate:   68 / minute Pulse rhythm:   regular BP sitting:   120 / 78  (left arm) Cuff size:   large  Vitals Entered By: Lamona Curl CMA [name]) (October 26, 2009 11:06 AM)  Physical Exam  General:  Well developed, well nourished, no acute distress.healthy appearing.   Head:  Normocephalic and atraumatic. Eyes:  PERRLA, no icterus.exam deferred to patient's ophthalmologist.   Neck:  Supple; no masses or thyromegaly. Lungs:  Clear throughout to auscultation. Heart:  Regular rate and rhythm; no murmurs, rubs,  or bruits. Abdomen:  Soft, nontender and nondistended. No masses, hepatosplenomegaly or hernias noted. Normal bowel sounds. Msk:  Symmetrical with no gross deformities. Normal posture. Extremities:  No clubbing, cyanosis, edema or deformities noted. Neurologic:  Alert and  oriented x4;  grossly normal neurologically. Cervical Nodes:  No significant cervical adenopathy. Axillary Nodes:  No significant axillary adenopathy. Inguinal Nodes:  No significant inguinal adenopathy. Psych:  Alert and cooperative. Normal mood and affect.   Impression  & Recommendations:  Problem # 1:  CHEST PAIN (ICD-786.50) Assessment Unchanged Probable atypical chest pain related to acid reflux and hiatal hernia. I have scheduled him for ultrasonography and endoscopy. I have changed him to AcipHex 20 mg twice a day with standard anti-reflex precautions and dietary restrictions. I also placed him on Librax 3 times a day before meals for his IBS complaints. He may need esophageal manometry and 24-hour pH probe testing. Our patient education video which showed to the patient and his wife today. His wife is present throughout the interview and exam.At the Time of endoscopy we'll check him for H. pylori.  Problem # 2:  NECK PAIN (ICD-723.1) Assessment: Unchanged Advised to avoid NSAIDs and use p.r.n. tramadol till workup completed.  Patient Instructions: 1)  Copy sent to : Dr. Alonza Smoker 2)  Please continue current medications.  3)  Avoid foods high in acid content ( tomatoes, citrus juices, spicy foods) . Avoid eating within 3 to 4 hours of lying down or before exercising. Do not over eat; try smaller more frequent meals. Elevate head of bed four inches when sleeping.  4)  Conscious Sedation brochure given.  5)  Upper Endoscopy brochure given.   6)  ultrasound scheduled. 7)  Change to AcipHex 20 mg twice a day and p.r.n. Librax t.i.d. before meals 8)  GI Reflux and Hiatal Hernia brochure given.   Appended Document: Epigastric Pain/dfs    Clinical Lists Changes  Medications: Added new medication of CLIDINIUM-CHLORDIAZEPOXIDE 2.5-5 MG  CAPS (CLIDINIUM-CHLORDIAZEPOXIDE) 1 by mouth three times a day as needed - Signed Added new medication of ACIPHEX 20 MG  TBEC (RABEPRAZOLE SODIUM) 1 by mouth two times a day - Signed Rx of CLIDINIUM-CHLORDIAZEPOXIDE 2.5-5 MG CAPS (CLIDINIUM-CHLORDIAZEPOXIDE) 1 by mouth three times a day as needed;  #90 x 3;  Signed;  Entered by: Ashok Cordia RN;  Authorized by: Mardella Layman MD [employer];  Method used: Electronically to  [facility] # [address] [place], [place]  [address], Ph: [phone], Fax: [phone] Rx of ACIPHEX 20 MG  TBEC (RABEPRAZOLE SODIUM) 1 by mouth two times a day;  #60 x 6;  Signed;  Entered by: Ashok Cordia RN;  Authorized by: Mardella Layman MD [facility];  Method used: Electronically to [facility] # [address] [place], [place]  [address], Ph: [phone], Fax: [phone] Orders: Added new Test order of EGD (EGD) - Signed Added new Test order of Ultrasound Abdomen (UAS) - Signed    Prescriptions: ACIPHEX 20 MG  TBEC (RABEPRAZOLE SODIUM) 1 by mouth two times a day  #60 x 6   Entered by:   Ashok Cordia RN   Authorized by:   Mardella Layman MD [facility]   Signed by:   Ashok Cordia RN on 10/26/2009   Method used:   Electronically to        [facility] [id number]* (retail)       [address]       [place], [place]  [address]       Ph: [phone]       Fax: [phone]   RxID:   [id number] CLIDINIUM-CHLORDIAZEPOXIDE 2.5-5 MG CAPS (CLIDINIUM-CHLORDIAZEPOXIDE) 1 by mouth three times a day as needed  #90 x 3   Entered by:   Ashok Cordia RN   Authorized by:   Mardella Layman MD [facility]   Signed by:   Ashok Cordia RN on 10/26/2009   Method used:   Electronically to        [facility] # [id number]* (retail)       [address]       [place], [place]  [address]       Ph: [phone]       Fax: [phone]   RxID:   [id number]

## 2010-04-02 NOTE — Miscellaneous (Signed)
  Clinical Lists Changes  Orders: Added new Test order of Mano/24 hour pH Probe (Mano/24 pH) - Signed

## 2010-04-02 NOTE — Letter (Signed)
Summary: Cardiac Catheterization Instructions- [facility]  [facility], Main Office  [address]   [place], [place] [address]   Phone: [phone]  Fax: [phone]     01/29/2010 MRN: [id number]  Casey Miles [address] [place], [place]  [address]  Dear [name],   You are scheduled for a Cardiac Catheterization on _12/8/11_ with Dr.McAlhany.  Please arrive to the 1st floor of the Heart and Vascular Center at [facility] at 6:30 am  on the day of your procedure. Please do not arrive before 6:30 a.m. Call the Heart and Vascular Center at [phone] if you are unable to make your appointmnet. The Code to get into the parking garage under the building is 0003. Take the elevators to the 1st floor. You must have someone to drive you home. Someone must be with you for the first 24 hours after you arrive home. Please wear clothes that are easy to get on and off and wear slip-on shoes. Do not eat or drink after midnight except water with your medications that morning. Bring all your medications and current insurance cards with you.  __x_ You may take ALL of your medications with water that morning.  The usual length of stay after your procedure is 2 to 3 hours. This can vary.  If you have any questions, please call the office at the number listed above.   Whitney Maeola Sarah RN

## 2010-04-02 NOTE — Progress Notes (Signed)
Summary: neurology referral  Phone Note Call from Patient Call back at Home Phone [phone] Call back at Work Phone [phone]   Caller: Patient Call For: Roderick Pee MD Summary of Call: Pt has developed a "burning externally on chest"  Feels like it is under the skin. Is requesting a referral to neurology as he is having some isolated numbness, burning, and pain thoughout his entire body.  Initial call taken by: Lynann Beaver CMA,  November 22, 2009 9:14 AM  Follow-up for Phone Call        ok  Follow-up by: Roderick Pee MD,  November 22, 2009 11:25 AM  Additional Follow-up for Phone Call Additional follow up Details #1::        Pt. notified. Additional Follow-up by: Lynann Beaver CMA,  November 22, 2009 1:17 PM  New Problems: PARESTHESIA (ICD-782.0)   New Problems: PARESTHESIA (ICD-782.0)

## 2010-04-02 NOTE — Procedures (Signed)
Summary: Upper Endoscopy  Patient: Casey Miles Note: All result statuses are Final unless otherwise noted.  Tests: (1) Upper Endoscopy (EGD)   EGD Upper Endoscopy       DONE     [facility]     [address]     [place], [place]  [address]           ENDOSCOPY PROCEDURE REPORT           PATIENT:  Casey, Miles  MR#:  [id number]     BIRTHDATE:  [birth year], 44 yrs. old  GENDER:  male           ENDOSCOPIST:  Vania Rea. Jarold Motto, MD, [facility]     Referred by:           PROCEDURE DATE:  10/31/2009     PROCEDURE:  EGD with biopsy     ASA CLASS:  Class I     INDICATIONS:  ATYPICAL CHEST PAIN.           MEDICATIONS:   Fentanyl 50 mcg IV, Versed 5 mg IV     TOPICAL ANESTHETIC:  Exactacain Spray           DESCRIPTION OF PROCEDURE:   After the risks benefits and     alternatives of the procedure were thoroughly explained, informed     consent was obtained.  The [facility] [id number] endoscope was     introduced through the mouth and advanced to the second portion of     the duodenum, without limitations.  The instrument was slowly     withdrawn as the mucosa was fully examined.     <<PROCEDUREIMAGES>>           The upper, middle, and distal third of the esophagus were     carefully inspected and no abnormalities were noted. The z-line     was well seen at the GEJ. The endoscope was pushed into the fundus     which was normal including a retroflexed view. The antrum,gastric     body, first and second part of the duodenum were unremarkable.     ESOPHAGEAL BIOPSIES DONE,CLO BX. DONE.  Abnormal appearing mucosa.     SMALL INLET POUCH OF ECTOPIC GASTRIC MUCOSA BIOPSIED #2 IN     PROXIMAL ESOPHAGITIS.    Retroflexed views revealed no     abnormalities.    The scope was then withdrawn from the patient     and the procedure completed.           COMPLICATIONS:  None           ENDOSCOPIC IMPRESSION:     1) Normal EGD     1.NO EROSIVE ESOPHAGITIS OR HIATIAL HERNIA NOTED.BIOPSIES DONE  TO R/O EOSINOPHILIC ESOPHAGITIS.     2.SMALL PROXIMAL ESOPHAGEAL FOCUS OF ECTOPIC GASTRIC MUCOSA.??     RELEVANCE.     3.CLO BX. DONE.R/O H.PYLORI.     RECOMMENDATIONS:     1) Await biopsy results     2) Rx CLO if positive     3) continue PPI     ULTRASOUND EXAM PENDING.DEPENDING ON WORKUP RESULTS,HE MAY NEED     MANOMETRY AND 24H PH PROBE TESTING.           REPEAT EXAM:  No           ______________________________     Vania Rea. Jarold Motto, MD, [facility]           CC:  Eugenio Hoes  Tawanna Cooler, MD           n.     Rosalie DoctorVania Rea. Patterson at 10/31/2009 10:05 AM           [place], [phone]  Note: An exclamation Jhonny (!) indicates a result that was not dispersed into the flowsheet. Document Creation Date: 10/31/2009 10:06 AM _______________________________________________________________________  (1) Order result status: Final Collection or observation date-time: 10/31/2009 09:53 Requested date-time:  Receipt date-time:  Reported date-time:  Referring Physician:   Ordering Physician: Sheryn Bison [id number]) Specimen Source:  Source: Launa Grill Order Number: [id number] Lab site:

## 2010-04-02 NOTE — Miscellaneous (Signed)
Summary: Orders Update   Clinical Lists Changes  Orders: Added new Referral order of Gastroenterology Referral (GI) - Signed 

## 2010-04-02 NOTE — Progress Notes (Signed)
Summary: Epigastric Pain -sooner appt  Phone Note From Other Clinic   Caller: [facility] @ Dr Tawanna Cooler [phone] Call For: Dr Jarold Motto Reason for Call: Schedule Patient Appt Summary of Call: Would like pt seen sooner than appt scheduled on 11-15-09. Dr Tawanna Cooler requesting next wk or withing 10 days for epigastric abd pain. Initial call taken by: Leanor Kail [facility],  October 16, 2009 8:57 AM  Follow-up for Phone Call        Appt sch for pt to see Dr. Jarold Motto on 10/26/09.  Terry  notified.  She will contact pt. Follow-up by: Ashok Cordia RN,  October 16, 2009 2:20 PM

## 2010-04-02 NOTE — Miscellaneous (Signed)
Summary: Orders Update  Clinical Lists Changes  Orders: Added new Test order of TLB-H Pylori Screen Gastric Biopsy (83013-CLOTEST) - Signed 

## 2010-04-02 NOTE — Assessment & Plan Note (Signed)
Summary: follow up from er/cjr   Vital Signs:  Patient profile:   45 year old male Weight:      213 pounds Temp:     98.5 degrees F oral BP sitting:   130 / 90  (left arm) Cuff size:   regular  Vitals Entered By: Kern Reap CMA [name]) (September 17, 2009 3:33 PM)  History of Present Illness: Casey Miles is a 45 year old male, nonsmoker, who comes in today following a hospital stay overnight from 716 two 717 for evaluation of atypical chest pain.  He presented to emerge room with a 3-day history of discomfort in the left side of his chest.  He points to the left side of his chest.  Low the nipple in the midclavicular line.  He states it comes and does sometimes dull, sometimes it's radiating to his back.  He describes no cardiac no pulmonary symptoms.  It typically occurs when he is sitting and is relieved by lying on his stomach.  Again, no cardiac no pulmonary symptoms, and complete cardiac evaluation.  The hospital, was negative  Allergies: No Known Drug Allergies  Past History:  Past medical, surgical, family and social histories (including risk factors) reviewed, and no changes noted (except as noted below).  Family History: Reviewed history from 09/24/2006 and no changes required. Family History Diabetes 1st degree relative Family History Hypertension FAm hx MI Fam hx IBS Family History of Cardiovascular disorder  Social History: Reviewed history from 09/24/2006 and no changes required. Never Smoked Alcohol use-yes Drug use-no Married Regular exercise-no  Review of Systems      See HPI  Physical Exam  General:  Well-developed,well-nourished,in no acute distress; alert,appropriate and cooperative throughout examination Head:  Normocephalic and atraumatic without obvious abnormalities. No apparent alopecia or balding. Eyes:  No corneal or conjunctival inflammation noted. EOMI. Perrla. Funduscopic exam benign, without hemorrhages, exudates or papilledema. Vision grossly  normal. Ears:  External ear exam shows no significant lesions or deformities.  Otoscopic examination reveals clear canals, tympanic membranes are intact bilaterally without bulging, retraction, inflammation or discharge. Hearing is grossly normal bilaterally. Nose:  External nasal examination shows no deformity or inflammation. Nasal mucosa are pink and moist without lesions or exudates. Mouth:  Oral mucosa and oropharynx without lesions or exudates.  Teeth in good repair. Neck:  No deformities, masses, or tenderness noted. Chest Wall:  No deformities, masses, tenderness or gynecomastia noted. Lungs:  Normal respiratory effort, chest expands symmetrically. Lungs are clear to auscultation, no crackles or wheezes. Heart:  Normal rate and regular rhythm. S1 and S2 normal without gallop, murmur, click, rub or other extra sounds.   Problems:  Medical Problems Added: 1)  Dx of Chest Pain  (ICD-786.50)  Impression & Recommendations:  Problem # 1:  CHEST PAIN (ICD-786.50) Assessment New  Orders: Cardiology Referral (Cardiology)  Complete Medication List: 1)  Protonix 40 Mg Tbec (Pantoprazole sodium) .... Take one tab two times a day 2)  Aspirin 81 Mg Tabs (Aspirin) .... Once daily  Patient Instructions: 1)  your symptoms are consistent with what we call "CHEST WALL PAIN".......... this is actually the most common cause for chest pain. 2)  However, because of your family history we will get she set up for an outpatient stress test

## 2010-04-02 NOTE — Progress Notes (Signed)
Summary: mucsle discomfort  Phone Note Call from Patient   Caller: chest wal pain Summary of Call: took call from pt r/t chest wall pain - still has , how long to expect discomfort?  I told him week to 10 days - ok to do aleve two times a day, warm soaks to area 2-3 x daily if neededBut any increase in pain or sob call or to er.  info to rachel for FYI. KIK Initial call taken by: Duard Brady LPN,  September 21, 2009 11:49 AM

## 2010-04-02 NOTE — Progress Notes (Signed)
  Phone Note Call from Patient   Caller: Patient Call For: Roderick Pee MD Summary of Call: Pt has Prednisone 20 mg. #30 and a refill.  Pt is having the same cervical and arm pain as last year and is asking if he can take the prednisone again and if so.......Marland Kitchenneeds new directions.  [phone] Initial call taken by: Lynann Beaver CMA,  June 19, 2009 8:28 AM  Follow-up for Phone Call        two tabs x 3 days, one x 3 days, a half x 3 days, then half a tablet Monday, Wednesday, Friday, for a two week taper Follow-up by: Roderick Pee MD,  June 19, 2009 9:56 AM  Additional Follow-up for Phone Call Additional follow up Details #1::        Left patient detailed message with Prednisone dosage. Additional Follow-up by: Lynann Beaver CMA,  June 19, 2009 11:01 AM

## 2010-04-02 NOTE — Progress Notes (Signed)
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  He is going to cancel his cath scheduled for 12/8 in the [facility]. He will call us back if his pain persists and he wants to proceed with having a cardiac catheterization. Whitney Maeola Sarah RN  February 05, 2010 10:08 AM  Follow-up by: Whitney Maeola Sarah RN,  February 05, 2010 10:08 AM

## 2010-04-02 NOTE — Progress Notes (Signed)
  Phone Note Call from Patient Call back at Work Phone [phone]   Summary of Call: Wants to speak to Dr. Tawanna Cooler about his chest pain, and if if could be a bone "issue" Initial call taken by: [facility] CMA AAMA,  January 07, 2010 4:35 PM  Follow-up for Phone Call        Pt is calling back to talk to Dr. Tawanna Cooler. Concerned about the rib and muscle related issue.   Seems to be sore in that area at times. Follow-up by: Lynann Beaver CMA AAMA,  January 08, 2010 11:20 AM  Additional Follow-up for Phone Call Additional follow up Details #1::        Casey Miles a history of chest wall pain.  They can be sharp and can be dull.  It can last for a few seconds.  They can last for a few hours, sometimes it occurs in the morning than it goes away with exercise.  He's had a cardiac evaluation.  Numerous studies.  No underlying etiology of significance have been discovered, now.  He's going to see a neurologist.  I again reinforced this, is what's called chest wall pain.  We do not find any evidence of any underlying pathology Additional Follow-up by: Roderick Pee MD,  January 09, 2010 11:59 AM

## 2010-04-02 NOTE — Progress Notes (Signed)
Summary: CX Mano/ph probe  Phone Note Outgoing Call   Summary of Call: Message left for pt. to call back  re: mano/ph probe appt. Initial call taken by: Teryl Lucy RN,  November 07, 2009 3:07 PM  Follow-up for Phone Call        wife states that patient and Dr. Tawanna Cooler decided to wait on the mano due to recent cardiac issuse. called endo at [facility] and cxed procedure.  Follow-up by: Harlow Mares CMA [name]),  November 08, 2009 11:32 AM

## 2010-04-02 NOTE — Progress Notes (Signed)
  Phone Note Outgoing Call   Summary of Call: Pt.scheduled for manometry and PH probe at [facility] on 11/13/09 at 8 a.m. Initial call taken by: Teryl Lucy RN,  November 07, 2009 2:50 PM

## 2010-04-02 NOTE — Letter (Signed)
Summary: New Patient letter  [facility]  [address] [place], [place] [address]   Phone: [phone]  Fax: [phone]       10/08/2009 MRN: [id number]  Casey Miles [address] [place], [place]  [address]  Dear Mr. FAN,  Welcome to the Gastroenterology Division at [facility].    You are scheduled to see Dr.  Jarold Motto on 11-15-09 at 8:30am on the 3rd floor at [facility], [address]  We ask that you try to arrive at our office 15 minutes prior to your appointment time to allow for check-in.  We would like you to complete the enclosed self-administered evaluation form prior to your visit and bring it with you on the day of your appointment.  We will review it with you.  Also, please bring a complete list of all your medications or, if you prefer, bring the medication bottles and we will list them.  Please bring your insurance card so that we may make a copy of it.  If your insurance requires a referral to see a specialist, please bring your referral form from your primary care physician.  Co-payments are due at the time of your visit and may be paid by cash, check or credit card.     Your office visit will consist of a consult with your physician (includes a physical exam), any laboratory testing he/she may order, scheduling of any necessary diagnostic testing (e.g. x-ray, ultrasound, CT-scan), and scheduling of a procedure (e.g. Endoscopy, Colonoscopy) if required.  Please allow enough time on your schedule to allow for any/all of these possibilities.    If you cannot keep your appointment, please call [phone] to cancel or reschedule prior to your appointment date.  This allows [place] the opportunity to schedule an appointment for another patient in need of care.  If you do not cancel or reschedule by 5 p.m. the business day prior to your appointment date, you will be charged a $50.00 late cancellation/no-show fee.    Thank you for choosing [facility] for your medical needs.  We appreciate the opportunity to care for you.  Please visit [place] at our website  to learn more about our practice.                     Sincerely,                                                             The Gastroenterology Division

## 2010-04-02 NOTE — Assessment & Plan Note (Signed)
Summary: PINCHED NERVE? - SHOULDER PAIN // RS   Vital Signs:  Patient profile:   45 year old male Weight:      206 pounds Temp:     98.6 degrees F oral BP sitting:   140 / 90  (left arm) Cuff size:   regular  Vitals Entered By: Kern Reap CMA [name]) (October 15, 2009 12:52 PM) CC: chest pain   CC:  chest pain.  History of Present Illness: Casey Miles is a 45 year old male, nonsmoker, who comes in today for evaluation of multiple symptoms.  He came in a month or so ago with atypical chest pain.  His symptoms were most likely consistent with chest wall pain.  However, he was concerned about his heart.  Therefore, we did a cardiac workup, which is negative.  He states he can and exercise.  Indeed, he played softball on Friday night, with no chest discomfort.  He then had spells of chest pain, which he describes as sharp, sometimes his right-sided sometimes left-sided sometimes it radiates into his back.  He also has symptoms of reflux esophagitis with burning and belching and indigestion particularly greasy and fatty foods.  He's taken occasional over-the-counter Zantac.  We requested a GI consult, however, they did not make an appointment until 6 to 8 weeks from now.  He would like that appointment moved up.  Is also having some discomfort in his left jaw.  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  Allergies (verified): No Known Drug Allergies  Past History:  Past medical, surgical, family and social histories (including risk factors) reviewed for relevance to current acute and chronic problems.  Family History: Reviewed history from 09/24/2006 and no changes required. Family History Diabetes 1st degree relative Family History Hypertension FAm hx MI Fam hx IBS Family History of Cardiovascular disorder  Social History: Reviewed history from 09/24/2006 and no changes required. Never Smoked Alcohol use-yes Drug use-no Married Regular exercise-no  Review of Systems      See  HPI  Physical Exam  General:  Well-developed,well-nourished,in no acute distress; alert,appropriate and cooperative throughout examination Head:  Normocephalic and atraumatic without obvious abnormalities. No apparent alopecia or balding. Eyes:  No corneal or conjunctival inflammation noted. EOMI. Perrla. Funduscopic exam benign, without hemorrhages, exudates or papilledema. Vision grossly normal. Ears:  External ear exam shows no significant lesions or deformities.  Otoscopic examination reveals clear canals, tympanic membranes are intact bilaterally without bulging, retraction, inflammation or discharge. Hearing is grossly normal bilaterally. Nose:  External nasal examination shows no deformity or inflammation. Nasal mucosa are pink and moist without lesions or exudates. Mouth:  Oral mucosa and oropharynx without lesions or exudates.  Teeth in good repair. Neck:  No deformities, masses, or tenderness noted. Chest Wall:  No deformities, masses, tenderness or gynecomastia noted. Lungs:  Normal respiratory effort, chest expands symmetrically. Lungs are clear to auscultation, no crackles or wheezes. Heart:  Normal rate and regular rhythm. S1 and S2 normal without gallop, murmur, click, rub or other extra sounds. Abdomen:  Bowel sounds positive,abdomen soft and non-tender without masses, organomegaly or hernias noted.   Impression & Recommendations:  Problem # 1:  CHEST PAIN (ICD-786.50) Assessment Unchanged  Problem # 2:  CHEST PAIN, ATYPICAL (ICD-786.59) Assessment: Unchanged  Orders: Gastroenterology Referral (GI)  Complete Medication List: 1)  Nitrostat 0.4 Mg Subl (Nitroglycerin) .Marland Kitchen.. 1 tab sl as needed cp if no resolution after 5 minutes repeat, after 3 doses if not resolution to er 2)  Diazepam 5  Mg Tabs (Diazepam) .Marland Kitchen.. 1 tab by mouth two times a day as needed anxiety/pain/insomnia 3)  Ultram 50 Mg Tabs (Tramadol hcl) .... One by mouth two times a day as needed for pain 4)  Maalox  Plus 225-200-25 Mg/71ml Susp (Alum & mag hydroxide-simeth) .... As needed 5)  Zantac 150 Mg Tabs (Ranitidine hcl) .... As needed  Patient Instructions: 1)  dietary-wise, .........Marland Kitchen begin a complete fat free diet and take 20 mg of Prilosec twice daily. 2)  We will call GI to get your appointment moved up sooner. 3)  also for the TMJ...wear  a mouth guard nightly

## 2010-04-02 NOTE — Progress Notes (Signed)
Summary: stress test and talk to Dr. Tawanna Cooler  Phone Note Call from Patient   Caller: Patient Call For: Casey Pee MD Summary of Call: Pt is calling for stress test results, and what the next steps would be re: his chest pain, and jaw pain. [phone] Initial call taken by: Lynann Beaver CMA,  October 04, 2009 4:37 PM  Follow-up for Phone Call        next step  GI evaluation, he could be having a reflux with chest and jaw pain.......... please set up GI consult Follow-up by: Casey Pee MD,  October 04, 2009 5:05 PM  Additional Follow-up for Phone Call Additional follow up Details #1::        Pt would like to give it a few days, and see how it feels, will call if he wants the GI consult. Additional Follow-up by: Lynann Beaver CMA,  October 05, 2009 8:20 AM     Appended Document: stress test and talk to Dr. Tawanna Cooler Pt wants to know what he can take for pain?  He usually takes Aleve, but will this be a prob with the questionable reflux???  Should he remain on ASA?  Appended Document: stress test and talk to Dr. Rhona Raider........ please call [name].  Care he should be getting him set up for a GI consult ASAP in the meantime tell him no aspirin or aspirin products......... which includes Aleve, Motrin, all those things........ and Ultram 50 mg b.i.d., p.r.n. for pain dispense 60 tabs no refills  Appended Document: stress test and talk to Dr. Tawanna Cooler Notified p regarding Ultram, an NO ASA or NON STEROIDALS .  He will await a call from GI.  [facility]

## 2010-04-02 NOTE — Assessment & Plan Note (Signed)
Summary: rov/wpa   Visit Type:  Follow-up Referring Provider:  n/a Primary Provider:  Kelle Darting, MD  CC:  same discomfort in chest.  History of Present Illness: 45 yo WM with no significant past medical history referred today for evaluation of chest pain. Four months ago, he had a severe coughing fit and had onset of chest pain. The chest pain at that time was localized to the left chest wall and radiated to the left shoulder and arm. He went to the ED and was admitted. He ruled out for an MI with serial enzymes. He saw Dr. Tawanna Cooler and was sent for an  exercise stress test in August which showed no ischemia. In September, he had recurrence of chest pain and went back to the ED. CTA of chest negative for PE. He has continued to have daily chest pain since then. The chest pain is described as a burning substernally with radiation to his neck and jaw. Occurs mostly at rest. There is rarely any associated SOB or palpitations. No near syncope or syncope. He has plans to see a neurologist soon for possible neuropathic etiology of the pain per plans of Dr. Tawanna Cooler.   Current Medications (verified): 1)  Nitrostat 0.4 Mg Subl (Nitroglycerin) .Marland Kitchen.. 1 Tab Sl As Needed Cp If No Resolution After 5 Minutes Repeat, After 3 Doses If Not Resolution To Er 2)  Diazepam 5 Mg Tabs (Diazepam) .Marland Kitchen.. 1 Tab By Mouth Two Times A Day As Needed Anxiety/pain/insomnia 3)  Ultram 50 Mg Tabs (Tramadol Hcl) .... One By Mouth Two Times A Day As Needed For Pain 4)  Maalox Plus 225-200-25 Mg/104ml Susp (Alum & Mag Hydroxide-Simeth) .... 2 Teaspoons As Needed 5)  Prilosec Otc 20 Mg Tbec (Omeprazole Magnesium) .... Take 1 Tablet By Mouth As Needed 6)  Clidinium-Chlordiazepoxide 2.5-5 Mg Caps (Clidinium-Chlordiazepoxide) .Marland Kitchen.. 1 By Mouth Three Times A Day As Needed 7)  Fish Oil   Oil (Fish Oil) .... 1000mg  2 Two Times A Day (When Remembers)  Allergies (verified): No Known Drug Allergies  Past History:  Past Medical History: Kidney  Stones ? GERD Chest Wall Pain  Past Surgical History: None  Family History: Reviewed history from 10/26/2009 and no changes required. Family History Diabetes 1st degree relative: Mother, Maternal Grandmother, Paternal Grandmother, Grandfather Family History Hypertension FAm hx MI Fam hx IBS Family History of Cardiovascular disorder Family History of Esophageal Cancer: Mudlogger Family History of Stomach Cancer: Uncle No FH of Colon Cancer: Family History of Colon Polyps: Grandmother Family History of Colitis: Maternal Grandmother, Maternal Grandfather Family History of Heart Disease: Maternal Grandmother, Maternal Grandfather  Mother-alive, HTN, HL, DM Father-deceased, age 55 MI, smoker Brother has chest pains, ? diagnosis  Social History: Reviewed history from 10/26/2009 and no changes required. Never Smoked Alcohol use-yes-1-2 x per wek Drug use-no Married, 2 children Regular exercise-no Daily Caffeine Use-1  cup daily  Review of Systems       The patient complains of chest pain.  The patient denies fatigue, malaise, fever, weight gain/loss, vision loss, decreased hearing, hoarseness, palpitations, shortness of breath, prolonged cough, wheezing, sleep apnea, coughing up blood, abdominal pain, blood in stool, nausea, vomiting, diarrhea, heartburn, incontinence, blood in urine, muscle weakness, joint pain, leg swelling, rash, skin lesions, headache, fainting, dizziness, depression, anxiety, enlarged lymph nodes, easy bruising or bleeding, and environmental allergies.    Vital Signs:  Patient profile:   45 year old male Height:      68 inches Weight:  202 pounds BMI:     30.83 Pulse rate:   70 / minute BP sitting:   132 / 88  (left arm) Cuff size:   regular  Vitals Entered By: Hardin Negus, RMA (January 01, 2010 3:52 PM)  Physical Exam  General:  General: Well developed, well nourished, NAD HEENT: OP clear, mucus membranes moist SKIN: warm,  dry Neuro: No focal deficits Musculoskeletal: Muscle strength 5/5 all ext Psychiatric: Mood and affect normal Neck: No JVD, no carotid bruits, no thyromegaly, no lymphadenopathy. Lungs:Clear bilaterally, no wheezes, rhonci, crackles CV: RRR no murmurs, gallops rubs Abdomen: soft, NT, ND, BS present Extremities: No edema, pulses 2+.    Exercise Stress Test  Procedure date:  10/04/2009  Findings:      Total Exercise Time       (min:sec):       11:50    Workload in METS:     13.4    ST Segment analysis:       At Rest:       normal ST segments-no evidence of significant ST depression       With Exercise:     no evidence of significant ST depression    Arrhythmia:             no    Angina during ETT:     absent (0)  Cardiovascular Risk Assessment/Plan:       The patient's hypertensive risk group is category B: At least one risk factor (excluding diabetes) with no target organ damage.  Today's blood pressure is 124/77.  His blood pressure goal is < 140/90.  Exercise Tolerance Test Assessment:    Quality of ETT:   non-diagnostic    ETT Interpretation:   normal-no evidence of ischemia by ST analysis    Comments:     Pt had good exercise tolerance with no angina. No EKG changes suggestive of ischemia.     Recommendations:   No further cardiac workup at this time.   Impression & Recommendations:  Problem # 1:  CHEST PAIN (ICD-786.50) Atypical pain in an otherwise healthy young male with a strong family history of CAD. He is very anxious and comes in today to discuss a possible cardiac cath. I met him during the treadmill stress test in August but had not formally seen him. I have discussed the possiblity of obstructive CAD. We could proceed to a stress test but he is not excited about this option as he would like a definitive diagnosis. I would offer a cardiac cath for defintiive diagnosis. He will call and let us know if he wishes to schedule this. If so, we would plan the cath in the  outpatient cath lab. Risks and benefits are reviewed. He will need pre-cath labs if scheduled for the procedure.    His updated medication list for this problem includes:    Nitrostat 0.4 Mg Subl (Nitroglycerin) .Marland Kitchen... 1 tab sl as needed cp if no resolution after 5 minutes repeat, after 3 doses if not resolution to er  Patient Instructions: 1)  Call us to schedule the catheterization if you decide you would like to have it done.

## 2010-04-02 NOTE — Progress Notes (Signed)
  Phone Note Call from Patient   Caller: Patient Call For: Roderick Pee MD Summary of Call: Pt has questions about chest wall pain.  Is asking about a bone spur in C-Spine and if it could be causing the chest wall pain?  He is having jaw and ear pain x one week.  Scheduled with Dr. Abner Greenspan to be seen today.  Has stress test scheduled.   Initial call taken by: Lynann Beaver CMA,  September 26, 2009 12:33 PM  Follow-up for Phone Call        If he is having chest and jaw pain I am not sure he shouldn't just go to the ER. If he goes there with CP he will get his stress test quickly. He should especially go if sob/diaphoretic/nausea. If he does not go he can still come in but I may just have to send him anyway Follow-up by: Danise Edge MD,  September 26, 2009 12:58 PM     Appended Document:  He does not have SOB/sweating or any pain on exertion.  Pain is just nagging and is only when he is at rest.  No chest pain while having the jaw pain.  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  He feels he is  being over sensitive about his symptoms, and is calling to try to avoid another ER visit.??  Will do whatever you feel is best.  Appended Document:  Got it I will be happy to see him to further evaluate but if he gets worse between now and 3:30 he should just go  Appended Document:  Already discussed with pt regarding ER if symptoms increase or change.

## 2010-04-02 NOTE — Assessment & Plan Note (Signed)
Summary: discuss cardiac cath/dm   Vital Signs:  Patient profile:   45 year old male Weight:      204 pounds Temp:     98.0 degrees F oral BP sitting:   110 / 90  (left arm) Cuff size:   regular  Vitals Entered By: Kathrynn Speed CMA (November 08, 2009 10:55 AM) CC: discuss cardiac cath, went to er on Friday, src Is Patient Diabetic? No   Primary Care Provider:  Kelle Darting, MD  CC:  discuss cardiac cath, went to er on Friday, and src.  History of Present Illness: Casey Miles is a 45 year old male, who comes in today for reevaluation of chest pain.  He's had a complete cardiac workup, including a stress test, most recently went to the emergency room and had a CT scan and other diagnostic tests and has had a complete GI workup, which was negative.  He has stated in the past that he did not think any of this was related to stress.  However, over the weekend, he developed some symptoms of anxiety and worry.  He called his brother, who stated that his brother has had the same problems.  He has for the past 2 1/2 years and has had a complete diagnostic workup.  All of which was negative.  The physician in the emergency room recommended a cardiac catheterization.  However, he continues to exercise and run and play softball at chest pain, and having a normal cardiac stress test.  I think at this juncture, we need to send him to [facility] to evaluate the stress-related issues.  Preventive Screening-Counseling & Management  Alcohol-Tobacco     Smoking Status: never  Current Medications (verified): 1)  Nitrostat 0.4 Mg Subl (Nitroglycerin) .Marland Kitchen.. 1 Tab Sl As Needed Cp If No Resolution After 5 Minutes Repeat, After 3 Doses If Not Resolution To Er 2)  Diazepam 5 Mg Tabs (Diazepam) .Marland Kitchen.. 1 Tab By Mouth Two Times A Day As Needed Anxiety/pain/insomnia 3)  Ultram 50 Mg Tabs (Tramadol Hcl) .... One By Mouth Two Times A Day As Needed For Pain 4)  Maalox Plus 225-200-25 Mg/53ml Susp (Alum & Mag  Hydroxide-Simeth) .... 2 Teaspoons As Needed 5)  Prilosec Otc 20 Mg Tbec (Omeprazole Magnesium) .... Take 1 Tablet By Mouth As Needed 6)  Clidinium-Chlordiazepoxide 2.5-5 Mg Caps (Clidinium-Chlordiazepoxide) .Marland Kitchen.. 1 By Mouth Three Times A Day As Needed  Allergies (verified): No Known Drug Allergies  Past History:  Past medical, surgical, family and social histories (including risk factors) reviewed, and no changes noted (except as noted below).  Past Medical History: Reviewed history from 10/26/2009 and no changes required. Kidney Stones GERD Chest Wall Pain  Past Surgical History: Reviewed history from 10/26/2009 and no changes required. unremarkable  Family History: Reviewed history from 10/26/2009 and no changes required. Family History Diabetes 1st degree relative: Mother, Maternal Grandmother, Paternal Grandmother, Grandfather Family History Hypertension FAm hx MI Fam hx IBS Family History of Cardiovascular disorder Family History of Esophageal Cancer: Mudlogger Family History of Stomach Cancer: Uncle No FH of Colon Cancer: Family History of Colon Polyps: Grandmother Family History of Colitis: Maternal Grandmother, Maternal Grandfather Family History of Heart Disease: Maternal Grandmother, Maternal Grandfather  Social History: Reviewed history from 10/26/2009 and no changes required. Never Smoked Alcohol use-yes-1-2 x per wek Drug use-no Married Regular exercise-no Daily Caffeine Use-1` cup daily  Review of Systems      See HPI  Physical Exam  General:  Well-developed,well-nourished,in no acute distress; alert,appropriate  and cooperative throughout examination   Impression & Recommendations:  Problem # 1:  CHEST PAIN (ICD-786.50) Assessment Unchanged  Complete Medication List: 1)  Nitrostat 0.4 Mg Subl (Nitroglycerin) .Marland Kitchen.. 1 tab sl as needed cp if no resolution after 5 minutes repeat, after 3 doses if not resolution to er 2)  Diazepam 5 Mg Tabs  (Diazepam) .Marland Kitchen.. 1 tab by mouth two times a day as needed anxiety/pain/insomnia 3)  Ultram 50 Mg Tabs (Tramadol hcl) .... One by mouth two times a day as needed for pain 4)  Maalox Plus 225-200-25 Mg/53ml Susp (Alum & mag hydroxide-simeth) .... 2 teaspoons as needed 5)  Prilosec Otc 20 Mg Tbec (Omeprazole magnesium) .... Take 1 tablet by mouth as needed 6)  Clidinium-chlordiazepoxide 2.5-5 Mg Caps (Clidinium-chlordiazepoxide) .Marland Kitchen.. 1 by mouth three times a day as needed  Patient Instructions: 1)  call Dr. [facility] make an appointment

## 2010-04-02 NOTE — Progress Notes (Signed)
Summary: Heart Cath  Phone Note Call from Patient Call back at Work Phone [phone] Call back at [phone]   Summary of Call: The patient's neurologist called late friday and informed him that he has a slip disc . The patient wants to know if he should have the heart cath.  Initial call taken by: Georga Bora,  February 04, 2010 9:21 AM  Follow-up for Phone Call        So the slipped disc in his neck might be causing his pain, would discuss that in detail with the neurologist. Would not be inappropriate to wait on the heart cath if the patient's pain is not any worse or different. To a degree it is up to him though. If he is uncomfortable with the uncertainty while they work on his neck then he should just proceed with the heart cath. Follow-up by: Danise Edge MD,  February 04, 2010 10:32 AM  Additional Follow-up for Phone Call Additional follow up Details #1::        Patient informed. Pt states he would like to discuss this with his wife. Additional Follow-up by: Josph Macho RMA,  February 04, 2010 11:06 AM

## 2010-04-02 NOTE — Letter (Signed)
Summary: Patient Notice-Endo Biopsy Results  [facility]  [address] [place], [place] [address]   Phone: [phone]  Fax: [phone]        November 02, 2009 MRN: [id number]    [facility] [address] [place], [place]  [address]    Dear Casey Miles,  I am pleased to inform you that the biopsies taken during your recent endoscopic examination did not show any evidence of cancer upon pathologic examination.  Additional information/recommendations:  __No further action is needed at this time.  Please follow-up with      your primary care physician for your other healthcare needs.  __ Please call [phone] to schedule a return visit to review      your condition.  x__ Continue with the treatment plan as outlined on the day of your      exam.My nurse will call you per scheduling esophageal manometry and 24-hour pH probe testing. All esophageal biopsies were normal. 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  __ You should have a repeat endoscopic examination for this problem              in _ months/years.   Please call us if you are having persistent problems or have questions about your condition that have not been fully answered at this time.  Sincerely,  Mardella Layman MD [facility]  This letter has been electronically signed by your physician.  Appended Document: Patient Notice-Endo Biopsy Results letter mailed

## 2010-04-02 NOTE — Progress Notes (Signed)
Summary: prescriptions and lab results.  Phone Note Call from Patient   Caller: Patient Call For: Dr. Rogelia Rohrer Summary of Call: Requesting lab results. [facility] does not have his prescrptions. [id number] Initial call taken by: Lynann Beaver CMA,  September 27, 2009 12:15 PM  Follow-up for Phone Call        Patient was given an rx for the Diazepam and the computer says the NTG did go to [facility], but I did resend. Labs still in process, have lab checking on them will notify him of results in am when they are available Follow-up by: Danise Edge MD,  September 27, 2009 2:40 PM    Prescriptions: NITROSTAT 0.4 MG SUBL (NITROGLYCERIN) 1 tab sl as needed CP if no resolution after 5 minutes repeat, after 3 doses if not resolution to ER  #25 x 1   Entered and Authorized by:   Danise Edge MD   Signed by:   Danise Edge MD on 09/27/2009   Method used:   Electronically to        [id number] (retail)       [address]       [place], [place]  [address]       Ph: [phone]       Fax: [phone]   RxID:   [id number]  Pt. notified. Lynann Beaver CMA  September 27, 2009 2:50 PM

## 2010-04-02 NOTE — Progress Notes (Signed)
Summary: chest pain  Phone Note Call from Patient Call back at Work Phone [phone]   Caller: Patient Call For: Roderick Pee MD Summary of Call: Pt is having chest pain again this week everyday, and feels like sore muscles.......radiates to low back.  Not taking Tramadol....Marland Kitchenonly Prilosec. Did yard work Sunday, and pain started again.  Also has some left arm pain. Initial call taken by: Lynann Beaver CMA,  October 25, 2009 11:35 AM  Follow-up for Phone Call        okay to take Tylenol or tramadol as needed for pain.  Keep your appointment tomorrow to see GI Follow-up by: Roderick Pee MD,  October 25, 2009 11:50 AM  Additional Follow-up for Phone Call Additional follow up Details #1::        Phone Call Completed Additional Follow-up by: Kern Reap CMA [name]),  October 25, 2009 12:18 PM

## 2010-04-02 NOTE — Progress Notes (Signed)
Summary: Pt was advised to call today 01/29/10  Phone Note Call from Patient Call back at Work Phone [phone]   Caller: Patient Call For: Danise Edge MD Summary of Call: Pt will be avail to talk "freely" around 3pm Initial call taken by: Lannette Donath,  January 29, 2010 12:01 PM  Follow-up for Phone Call        returned patient call to work number, he spoke with neurology today and are going to do his MRI of C spine with and without contrast, this Thursday. He is prepared to proceed with labs as we discussed at his visit and will stop by to pick up the order on his way home from work today. He did continue to struggle with intermittent CP and epigastric discomfort last night so he is agreeing to proceed with cardiac cath to r/o cardiac causes of his discomfort and he will call and arrange this. Follow-up by: Danise Edge MD,  January 29, 2010 4:06 PM

## 2010-04-02 NOTE — Progress Notes (Signed)
Summary: Triage  Phone Note Call from Patient   Caller: Patient Summary of Call: Pt calling.  Insurance will not cover Aciphex.  Pt was given pantoprazole when he was seen in the ER.  Asks if he can take this instead.  Pt instructed to use the panroprazole two times a day until he comes in for EGD next week.  Initial call taken by: Ashok Cordia RN,  October 26, 2009 3:32 PM

## 2010-04-02 NOTE — Letter (Signed)
Summary: EGD Instructions  [facility]  [address] [place], [place] [address]   Phone: [phone]  Fax: [phone]       Casey Miles    [birth year]    MRN: [id number]       Procedure Day Dorna Bloom: Wednesday, 10/31/09     Arrival Time:  8:30     Procedure Time: 9:30     Location of Procedure:                    _ X _ [facility] (4th Floor)    PREPARATION FOR ENDOSCOPY   On 10/31/09 THE DAY OF THE PROCEDURE:  1.   No solid foods, milk or milk products are allowed after midnight the night before your procedure.  2.   Do not drink anything colored red or purple.  Avoid juices with pulp.  No orange juice.  3.  You may drink clear liquids until 7:30 am, which is 2 hours before your procedure.                                                                                                CLEAR LIQUIDS INCLUDE: Water Jello Ice Popsicles Tea (sugar ok, no milk/cream) Powdered fruit flavored drinks Coffee (sugar ok, no milk/cream) Gatorade Juice: apple, white grape, white cranberry  Lemonade Clear bullion, consomm, broth Carbonated beverages (any kind) Strained chicken noodle soup Hard Candy   MEDICATION INSTRUCTIONS  Unless otherwise instructed, you should take regular prescription medications with a small sip of water as early as possible the morning of your procedure.                   OTHER INSTRUCTIONS  You will need a responsible adult at least 43 years of age to accompany you and drive you home.   This person must remain in the waiting room during your procedure.  Wear loose fitting clothing that is easily removed.  Leave jewelry and other valuables at home.  However, you may wish to bring a book to read or an iPod/MP3 player to listen to music as you wait for your procedure to start.  Remove all body piercing jewelry and leave at home.  Total time from sign-in until discharge is approximately 2-3 hours.  You should go  home directly after your procedure and rest.  You can resume normal activities the day after your procedure.  The day of your procedure you should not:   Drive   Make legal decisions   Operate machinery   Drink alcohol   Return to work  You will receive specific instructions about eating, activities and medications before you leave.    The above instructions have been reviewed and explained to me by   _______________________    I fully understand and can verbalize these instructions _____________________________ Date _________

## 2010-04-03 ENCOUNTER — Encounter: Payer: Self-pay | Admitting: Family Medicine

## 2010-04-04 NOTE — Letter (Signed)
Summary: Cardiac Catheterization Instructions- [facility]  [facility], Main Office  [address]   [place], [place] [address]   Phone: [phone]  Fax: [phone]     03/08/2010 MRN: [id number]  Casey Miles [address] [place], [place]  [address]  Dear Mr. BUR,   You are scheduled for a Cardiac Catheterization on 03/12/10 with Dr.Erendira Crabtree.  Please arrive to the 1st floor of the Heart and Vascular Center at [facility] at 12:30 pm on the day of your procedure. Please do not arrive before 6:30 a.m. Call the Heart and Vascular Center at [phone] if you are unable to make your appointmnet. The Code to get into the parking garage under the building is 0030. Take the elevators to the 1st floor. You must have someone to drive you home. Someone must be with you for the first 24 hours after you arrive home. Please wear clothes that are easy to get on and off and wear slip-on shoes. You may have a clear liquid breakfast before 7:00am and then nothing to eat or drink. Bring all your medications and current insurance cards with you.  _x__ Make sure you take your aspirin.  __x_ You may take ALL of your medications with water that morning.   The usual length of stay after your procedure is 2 to 3 hours. This can vary.  If you have any questions, please call the office at the number listed above.   Whitney Maeola Sarah RN

## 2010-04-04 NOTE — Progress Notes (Signed)
Summary: lump on the right side ***pt rtn ur call**  Phone Note Call from Patient Call back at Work Phone [phone]   Caller: Patient Reason for Call: Talk to Nurse Summary of Call: pt states he has a lump at the right side by the Catheterization site. Initial call taken by: Roe Coombs,  March 19, 2010 8:04 AM  Follow-up for Phone Call        Patient states that his groin site has a hematoma about the size of a silver dollar. It has gotten a little worse since yesterday. It isn't extremely painful but a little sore. It isn't bleeding or oozing. Since the hematoma according to Mr. Lowenthal has gotten worse, I will discuss with Dr. Clifton James and see if he wants him to come by the office tomorrow. He will call me if the hematoma gets worse or if he notices active bleeding or swelling at the site. Whitney Maeola Sarah RN  March 19, 2010 8:50 AM  Follow-up by: Whitney Maeola Sarah RN,  March 19, 2010 8:51 AM  Additional Follow-up for Phone Call Additional follow up Details #1::        Can we have him added on for tomorrow? thanks, chris  LVMTCB Whitney Maeola Sarah RN  March 20, 2010 10:21 AM  patient will come into the office today @ 3:30pm so Dr. Clifton James can check his groin site. Whitney Maeola Sarah RN  March 20, 2010 1:12 PM  Additional Follow-up by: Verne Carrow, MD,  March 19, 2010 10:11 AM    Additional Follow-up for Phone Call Additional follow up Details #2::    pt rtn your call you can reach him at [phone] Omer Jack  March 20, 2010 12:22 PM

## 2010-04-04 NOTE — Progress Notes (Signed)
Summary: ready to set up heart cath  Phone Note Call from Patient Call back at Home Phone [phone]   Caller: Spouse-laura Reason for Call: Talk to Nurse Summary of Call: ready to set up heart cath .  Initial call taken by: Lorne Skeens,  March 08, 2010 1:41 PM  Follow-up for Phone Call        Patient has been c/o chest pain on & off for at least a few weeks. He would like to proceed with having his cardiac catheterization. He has not tried taking the Nitroglycerine which I advised his wife to tell him to do. I also advised him to go to the ER if the CP persists over the weekend without relief from the Nitro. I will set him up for a cath with Dr. Clifton James.  Sch. for 03/12/10 with [place] @ 1:30pm. Ellender Hose RN  March 08, 2010 2:58 PM  Follow-up by: Whitney Maeola Sarah RN,  March 08, 2010 2:58 PM

## 2010-04-04 NOTE — Cardiovascular Report (Signed)
Summary: Pre-Cath Orders  Pre-Cath Orders   Imported By: Marylou Mccoy 03/14/2010 08:50:07  _____________________________________________________________________  External Attachment:    Type:   Image     Comment:   External Document

## 2010-04-04 NOTE — Letter (Signed)
Summary: [facility] Neuroimaging Report   [facility] Neuroimaging Report   Imported By: Roderic Ovens 03/06/2010 15:50:33  _____________________________________________________________________  External Attachment:    Type:   Image     Comment:   External Document

## 2010-04-04 NOTE — Assessment & Plan Note (Signed)
Summary: 1 month fu/dt rsch from bmp/dt   Vital Signs:  Patient profile:   45 year old male Height:      68 inches (172.72 cm) Weight:      199.25 pounds (90.57 kg) O2 Sat:      100 % on Room air Temp:     97.9 degrees F (36.61 degrees C) oral Pulse rate:   70 / minute BP sitting:   140 / 84  (right arm) Cuff size:   regular  Vitals Entered By: Josph Macho RMA (March 05, 2010 1:04 PM)  O2 Flow:  Room air CC: 1 month follow up/ CF Is Patient Diabetic? No   History of Present Illness: he is a 45 year old male in today for reevaluation of chronic pain. He continues to pursue further diagnosis and treatment of his chronic neck left shoulder left arm, chest pain. He has been seen by cardiology who has been reassuring that his symptoms are unlikely related to his heart. He has been offered a cardiac catheter for complete reassurance but thus far has not chosen to proceed. He did have an MRI of the cervical spine which did show significant pathology with a large disc protrusion to the left noted and multiple other smaller lesions as well. They did do an epidural injection in his cervical spine in brief her imaging which did resolve some of his left shoulder and left arm but did not resolve his chest pain. He does note his chest pain is worse with certain positions and worse with palpation. He he does continue to worry about his chest pain and 5 he is exercising less due to his worries. Denies shortness of breath but does sometimes note he takes an incomplete breath secondary to pain with deep inspiration. He denies palpitations. He has been monitoring his blood pressure in the morning they tend to run 110-120 over 80s pulse ranging from 6377 in the afternoons and evenings his bulk and increased to a high number and a 91 systolic blood pressures have ranged from 125 and 145 his diastolics have remained in the 80s. He denied recent illness, fevers, chills, headache, abdominal pain, GI complaints,  epigastric pain, diaphoresis, nausea  Current Medications (verified): 1)  Nitrostat 0.4 Mg Subl (Nitroglycerin) .Marland Kitchen.. 1 Tab Sl As Needed Cp If No Resolution After 5 Minutes Repeat, After 3 Doses If Not Resolution To Er 2)  Diazepam 5 Mg Tabs (Diazepam) .Marland Kitchen.. 1 Tab By Mouth Two Times A Day As Needed Anxiety/pain/insomnia 3)  Ultram 50 Mg Tabs (Tramadol Hcl) .... One By Mouth Two Times A Day As Needed For Pain 4)  Maalox Plus 225-200-25 Mg/110ml Susp (Alum & Mag Hydroxide-Simeth) .... 2 Teaspoons As Needed 5)  Clidinium-Chlordiazepoxide 2.5-5 Mg Caps (Clidinium-Chlordiazepoxide) .Marland Kitchen.. 1 By Mouth Three Times A Day As Needed 6)  Fish Oil   Oil (Fish Oil) .... 1000mg  2 Two Times A Day (When Remembers) 7)  Cyclobenzaprine Hcl 10 Mg Tabs (Cyclobenzaprine Hcl) .... 1/2 To 1 Tab By Mouth At Bedtime 8)  Aspercreme/aloe 10 % Crea (Trolamine Salicylate) .... Apply To Chest Wall As Needed For Pain  Allergies (verified): No Known Drug Allergies  Past History:  Past medical history reviewed for relevance to current acute and chronic problems. Social history (including risk factors) reviewed for relevance to current acute and chronic problems.  Past Medical History: Reviewed history from 01/01/2010 and no changes required. Kidney Stones ? GERD Chest Wall Pain  Social History: Reviewed history from 01/01/2010 and no changes  required. Never Smoked Alcohol use-yes-1-2 x per wek Drug use-no Married, 2 children Regular exercise-no Daily Caffeine Use-1  cup daily  Review of Systems      See HPI  Physical Exam  General:  Well-developed,well-nourished,in no acute distress; alert,appropriate and cooperative throughout examination Head:  Normocephalic and atraumatic without obvious abnormalities. No apparent alopecia or balding. Mouth:  Oral mucosa and oropharynx without lesions or exudates.  Teeth in good repair. Neck:  No deformities, masses, or tenderness noted. Lungs:  Normal respiratory effort,  chest expands symmetrically. Lungs are clear to auscultation, no crackles or wheezes. Heart:  Normal rate and regular rhythm. S1 and S2 normal without gallop, murmur, click, rub or other extra sounds. Abdomen:  Bowel sounds positive,abdomen soft and non-tender without masses, organomegaly or hernias noted. Extremities:  No clubbing, cyanosis, edema, or deformity noted with normal full range of motion of all joints.   Cervical Nodes:  No lymphadenopathy noted Psych:  Cognition and judgment appear intact. Alert and cooperative with normal attention span and concentration. No apparent delusions, illusions, hallucinations   Impression & Recommendations:  Problem # 1:  CHEST PAIN (ICD-786.50) Multifactorial and most likely related to pathology in cervical spine and back. Describes symptoms c/w costochondritis in his anterior CW. Hurts worse with palpation. He is encouraged to continue with chiropractic care and continue soft tissue massage as well. Due to his persistent discomfort and worries is encouraged to proceed with heart cath to r/o heart disease so he would feel more comfortable increasing his activity level again. Seek immediate care if his symptoms worsen and do not abate. Spent 30 minutes of visit in counselling of patient regarding his risks and options regarding his CP  Problem # 2:  ABDOMINAL PAIN, EPIGASTRIC (ICD-789.06) This has resolved with some dietary changes  Problem # 3:  SHOULDER PAIN, LEFT (ICD-719.41)  His updated medication list for this problem includes:    Ultram 50 Mg Tabs (Tramadol hcl) ..... One by mouth two times a day as needed for pain    Cyclobenzaprine Hcl 10 Mg Tabs (Cyclobenzaprine hcl) .Marland Kitchen... 1/2 to 1 tab by mouth at bedtime This symptom did improve somewhat with an epidural injection in his cervical spine, will consider a new referral back to [facility], ortho or neurosurg if he wants to pursue further injections.  Complete Medication List: 1)   Nitrostat 0.4 Mg Subl (Nitroglycerin) .Marland Kitchen.. 1 tab sl as needed cp if no resolution after 5 minutes repeat, after 3 doses if not resolution to er 2)  Diazepam 5 Mg Tabs (Diazepam) .Marland Kitchen.. 1 tab by mouth two times a day as needed anxiety/pain/insomnia 3)  Ultram 50 Mg Tabs (Tramadol hcl) .... One by mouth two times a day as needed for pain 4)  Maalox Plus 225-200-25 Mg/73ml Susp (Alum & mag hydroxide-simeth) .... 2 teaspoons as needed 5)  Clidinium-chlordiazepoxide 2.5-5 Mg Caps (Clidinium-chlordiazepoxide) .Marland Kitchen.. 1 by mouth three times a day as needed 6)  Fish Oil Oil (Fish oil) .... 1000mg  2 two times a day (when remembers) 7)  Cyclobenzaprine Hcl 10 Mg Tabs (Cyclobenzaprine hcl) .... 1/2 to 1 tab by mouth at bedtime 8)  Aspercreme/aloe 10 % Crea (Trolamine salicylate) .... Apply to chest wall as needed for pain  Patient Instructions: 1)  Please schedule a follow-up appointment in 1 to 2 month.  2)  Aleve 220mg  1-2 tabs by mouth two times a day with food for next month 3)  Monitor BP closely call if numbers remain over 140/90 consistently 4)  Consider proceeding withheart cath 5)  Moist heat and stretching to back and ice to anterior chest wall then Aspercreme as needed. Continue with Neurology, chiropractic and consider massage therapy. May need referral to orth if symptoms do not improve   Orders Added: 1)  Est. Patient Level IV [[address]]

## 2010-04-04 NOTE — Progress Notes (Signed)
Summary: Groin site  Phone Note Call from Patient Call back at [phone]   Caller: Patient Reason for Call: Talk to Nurse Initial call taken by: Judie Grieve,  March 18, 2010 8:58 AM  Follow-up for Phone Call        I spoke with the pt and he does have bruising at his cath site. This is getting better.  The pt said he has a small area the size of a quarter at cath site. The pt said he did rebleed and pressure had to be applied to his groin again.  The pt otherwise is doing okay and just wanted to know if he should be concerned.  I made the pt aware that he probably had some swelling due to blood under the skin. This area has not increased in size.  I instructed the pt to keep an eye on the area and contact our office if he develops any bleeding, pain or increased swelling at cath site.  Pt agreed with plan.  Follow-up by: Julieta Gutting, RN, BSN,  March 18, 2010 9:21 AM

## 2010-04-25 ENCOUNTER — Encounter: Payer: Self-pay | Admitting: Family Medicine

## 2010-04-30 ENCOUNTER — Other Ambulatory Visit: Payer: Self-pay | Admitting: Neurology

## 2010-04-30 DIAGNOSIS — M542 Cervicalgia: Secondary | ICD-10-CM

## 2010-04-30 DIAGNOSIS — M5090 Cervical disc disorder, unspecified, unspecified cervical region: Secondary | ICD-10-CM

## 2010-05-03 ENCOUNTER — Ambulatory Visit
Admission: RE | Admit: 2010-05-03 | Discharge: 2010-05-03 | Disposition: A | Discharge status: Home / Self Care | Payer: Self-pay | Source: Ambulatory Visit | Attending: Neurology | Admitting: Neurology

## 2010-05-03 DIAGNOSIS — M5090 Cervical disc disorder, unspecified, unspecified cervical region: Secondary | ICD-10-CM

## 2010-05-03 DIAGNOSIS — M542 Cervicalgia: Secondary | ICD-10-CM

## 2010-05-06 ENCOUNTER — Ambulatory Visit (INDEPENDENT_AMBULATORY_CARE_PROVIDER_SITE_OTHER): Payer: BC Managed Care – PPO | Admitting: Family Medicine

## 2010-05-06 ENCOUNTER — Encounter: Payer: Self-pay | Admitting: Family Medicine

## 2010-05-06 DIAGNOSIS — M542 Cervicalgia: Secondary | ICD-10-CM

## 2010-05-06 DIAGNOSIS — M5030 Other cervical disc degeneration, unspecified cervical region: Secondary | ICD-10-CM

## 2010-05-06 DIAGNOSIS — M940 Chondrocostal junction syndrome [Tietze]: Secondary | ICD-10-CM

## 2010-05-06 DIAGNOSIS — R209 Unspecified disturbances of skin sensation: Secondary | ICD-10-CM

## 2010-05-06 DIAGNOSIS — R1013 Epigastric pain: Secondary | ICD-10-CM

## 2010-05-06 HISTORY — DX: Chondrocostal junction syndrome (tietze): M94.0

## 2010-05-08 ENCOUNTER — Telehealth: Payer: Self-pay | Admitting: Family Medicine

## 2010-05-09 NOTE — Letter (Signed)
Summary: York Spaniel MD  York Spaniel MD   Imported By: Lester Sunray 05/03/2010 08:04:22  _____________________________________________________________________  External Attachment:    Type:   Image     Comment:   External Document

## 2010-05-10 ENCOUNTER — Encounter: Payer: Self-pay | Admitting: Family Medicine

## 2010-05-14 NOTE — Assessment & Plan Note (Signed)
Summary: 2 month follow up/ vfw   Vital Signs:  Patient profile:   45 year old male Height:      68 inches (172.72 cm) Weight:      202 pounds (91.82 kg) O2 Sat:      100 % on Room air Temp:     97.8 degrees F (36.56 degrees C) oral Pulse rate:   67 / minute BP sitting:   146 / 87  (right arm) Cuff size:   large  Vitals Entered By: Josph Macho RMA (May 06, 2010 11:15 AM)  O2 Flow:  Room air CC: 2 month follow up/ CF Is Patient Diabetic? No   History of Present Illness: Patient is a 45 yo Caucasian male in today for reevaluation of persistent, atypical CP. He completed his cardiac work up which fortunately resulted in a clean cardiac cath and an EF of 65 % was noted. Unfortunately his CP continues to recur. He is currently receiving care from a chiropractor, a massage therapist and a neurosurgeon. she has undergone 2 epidural injections into the cervical spine he believes they've helped marginally. Unfortunately he still has chronic pain in his neck or shoulder or left upper chest wall and even down into his epigastrium and reading around his lower ribs. The intensity of the pain varies tremendously at times it's a nagging pain times debilitating causing him to have difficulty with deep breathing the pain no recent traumas or injuries he acknowledges ongoing issues with poor diet he did have frequently. Denies any obvious dyspepsia. Denies palpitations, shortness of breath at rest, GI or GU concerns at today's visit. Allergies and congestion well-controlled at this time.  Current Medications (verified): 1)  Nitrostat 0.4 Mg Subl (Nitroglycerin) .Marland Kitchen.. 1 Tab Sl As Needed Cp If No Resolution After 5 Minutes Repeat, After 3 Doses If Not Resolution To Er 2)  Diazepam 5 Mg Tabs (Diazepam) .Marland Kitchen.. 1 Tab By Mouth Two Times A Day As Needed Anxiety/pain/insomnia 3)  Ultram 50 Mg Tabs (Tramadol Hcl) .... One By Mouth Two Times A Day As Needed For Pain 4)  Maalox Plus 225-200-25 Mg/60ml Susp (Alum &  Mag Hydroxide-Simeth) .... 2 Teaspoons As Needed 5)  Clidinium-Chlordiazepoxide 2.5-5 Mg Caps (Clidinium-Chlordiazepoxide) .Marland Kitchen.. 1 By Mouth Three Times A Day As Needed 6)  Fish Oil   Oil (Fish Oil) .... 1000mg  2 Two Times A Day (When Remembers) 7)  Aspercreme/aloe 10 % Crea (Trolamine Salicylate) .... Apply To Chest Wall As Needed For Pain  Allergies (verified): No Known Drug Allergies  Past History:  Past medical history reviewed for relevance to current acute and chronic problems. Social history (including risk factors) reviewed for relevance to current acute and chronic problems.  Past Medical History: Reviewed history from 01/01/2010 and no changes required. Kidney Stones ? GERD Chest Wall Pain  Social History: Reviewed history from 01/01/2010 and no changes required. Never Smoked Alcohol use-yes-1-2 x per wek Drug use-no Married, 2 children Regular exercise-no Daily Caffeine Use-1  cup daily  Review of Systems      See HPI  Physical Exam  General:  Well-developed,well-nourished,in no acute distress; alert,appropriate and cooperative throughout examination Head:  Normocephalic and atraumatic without obvious abnormalities. No apparent alopecia or balding. Mouth:  Oral mucosa and oropharynx without lesions or exudates.  Teeth in good repair. Neck:  No deformities, masses, or tenderness noted. Lungs:  Normal respiratory effort, chest expands symmetrically. Lungs are clear to auscultation, no crackles or wheezes. Heart:  Normal rate and regular rhythm.  S1 and S2 normal without gallop, murmur, click, rub or other extra sounds. Abdomen:  Bowel sounds positive,abdomen soft and non-tender without masses, organomegaly or hernias noted. Extremities:  No clubbing, cyanosis, edema, or deformity noted with normal full range of motion of all joints.   Cervical Nodes:  No lymphadenopathy noted Psych:  Cognition and judgment appear intact. Alert and cooperative with normal attention span  and concentration. 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 Referral (Misc. Ref) He is presently following with Neurosurgery and has undergone a couple of epidural injections in his cervical spine and he believes they may have helped his CP slightly. He continues to have pain that fluctuates from nagging to frightening in its intensity. THe pain can be in the neck, left shoulder, left anterior chest and even start in the epigastrium at the Xiphoid process and wrap around to his back. The other day he noted a sharp pain that radiated through from his chest to his back and lasted several hours before resolving. He recently underwent a Thoracic MRI which he reports was normal. He will continue his chiropractic and massage therapy and is considering some Acupuncture as well. Will refer to Physiatry for a consultation and he has not tried the Cyclobenzaprine yet or used the Diazepam in quite some time. He is encouraged to try these when the pain gets intense. Encouraged ongoing attempts at good posture and weight loss as well. May try heat and ice as well as Aspercreme for relief as indicated  Problem # 2:  ABDOMINAL PAIN, EPIGASTRIC (ICD-789.06) Will offer the patient an abdominal US to further evaluate and he is encouraged to try and maintain a diet low in fatty, spicy and acidic foods and to try and minimize fast and junk foods.  Problem # 3:  DEGENERATIVE DISC DISEASE, CERVICAL SPINE (ICD-722.4) Will continue his care with neurosurgery and proceed with his next epidural injection when the time comes to try and maximize his response  Complete Medication List: 1)  Nitrostat 0.4 Mg Subl (Nitroglycerin) .Marland Kitchen.. 1 tab sl as needed cp if no resolution after 5 minutes repeat, after 3 doses if not resolution to er 2)  Diazepam 5 Mg Tabs (Diazepam) .Marland Kitchen.. 1 tab by mouth two times a day as needed  anxiety/pain/insomnia 3)  Ultram 50 Mg Tabs (Tramadol hcl) .... One by mouth two times a day as needed for pain 4)  Maalox Plus 225-200-25 Mg/14ml Susp (Alum & mag hydroxide-simeth) .... 2 teaspoons as needed 5)  Clidinium-chlordiazepoxide 2.5-5 Mg Caps (Clidinium-chlordiazepoxide) .Marland Kitchen.. 1 by mouth three times a day as needed 6)  Fish Oil Oil (Fish oil) .... 1000mg  2 two times a day (when remembers) 7)  Aspercreme/aloe 10 % Crea (Trolamine salicylate) .... Apply to chest wall as needed for pain  Patient Instructions: 1)  Please schedule a follow-up appointment in 2 months or as needed 2)  Try Aspercreme as needed for point tenderness 3)  Try the Cyclobenzaprin 1/2 to 1 tab by mouth as needed muscle pain. 4)  Try moist heat and daily stretching 5)  Use Aleve or Advil but not both on the same day may use Tylenol as needed pain 6)  Take  1000 mg of tylenol every 8  hours as needed for relief of pain or comfort of fever. Avoid taking more than 3000 mg in a 24 hour period( can cause liver damage in higher doses).  7)  Take  400-800 mg of Ibuprofen (Advil, Motrin) with food every 2400mg /24 hours as needed  for relief of pain or comfort of fever.    Orders Added: 1)  Misc. Referral [Misc. Ref] 2)  Est. Patient Level IV [16109]

## 2010-05-14 NOTE — Progress Notes (Signed)
----   Converted from flag ---- ---- 05/08/2010 8:37 AM, Casey Miles RMA wrote: Kiam states he did the [place] with Dr Jarold Motto in Sept or Oct 2011? Pt states he would like to have the referral for the abdominal aortic US if this is what md suggests to do next.  ---- 05/07/2010 11:59 AM, Danise Edge MD wrote: As I was doing his note I realized his epigstric pain could be further worked up with [place]. It looks like Dr Jarold Motto ordered LUQ [place] but I cannot see where it has beeen completed. So we should do that if that is the case. Also an Abdominal Aortic US could also be done to further evaluate. Please check with him and see if he is interested in me ordering them. No radiation is involved ------------------------------

## 2010-05-16 ENCOUNTER — Encounter: Payer: Self-pay | Admitting: Family Medicine

## 2010-05-16 LAB — COMPREHENSIVE METABOLIC PANEL
CO2: 28 mEq/L (ref 19–32)
Calcium: 9.8 mg/dL (ref 8.4–10.5)
Chloride: 102 mEq/L (ref 96–112)
Creatinine, Ser: 1.3 mg/dL (ref 0.4–1.5)
GFR calc non Af Amer: >60 mL/min (ref >60)
Glucose, Bld: 122 mg/dL — ABNORMAL HIGH (ref 70–99)
Total Bilirubin: 0.8 mg/dL (ref 0.3–1.2)

## 2010-05-16 LAB — POCT CARDIAC MARKERS
CKMB, poc: <1 ng/mL — ABNORMAL LOW (ref 1.0–8.0)
Troponin i, poc: <0.05 ng/mL (ref 0.00–0.09)

## 2010-05-16 LAB — LIPASE, BLOOD: Lipase: 170 U/L (ref 23–300)

## 2010-05-16 LAB — CBC
HCT: 42.2 % (ref 39.0–52.0)
Hemoglobin: 14.6 g/dL (ref 13.0–17.0)
MCH: 32.4 pg (ref 26.0–34.0)
MCHC: 34.6 g/dL (ref 30.0–36.0)
MCV: 93.6 fL (ref 78.0–100.0)

## 2010-05-16 LAB — DIFFERENTIAL
Basophils Absolute: 0.1 10*3/uL (ref 0.0–0.1)
Eosinophils Absolute: 0.1 10*3/uL (ref 0.0–0.7)
Lymphocytes Relative: 19 % (ref 12–46)
Lymphs Abs: 1.9 10*3/uL (ref 0.7–4.0)
Neutrophils Relative %: 76 % (ref 43–77)

## 2010-05-17 ENCOUNTER — Encounter (INDEPENDENT_AMBULATORY_CARE_PROVIDER_SITE_OTHER): Payer: BC Managed Care – PPO

## 2010-05-17 DIAGNOSIS — R109 Unspecified abdominal pain: Secondary | ICD-10-CM

## 2010-05-18 LAB — CARDIAC PANEL(CRET KIN+CKTOT+MB+TROPI)
CK, MB: 1.4 ng/mL (ref 0.3–4.0)
CK, MB: 1.6 ng/mL (ref 0.3–4.0)
Relative Index: 0.9 (ref 0.0–2.5)
Relative Index: 0.9 (ref 0.0–2.5)
Total CK: 174 U/L (ref 7–232)
Troponin I: <0.01 ng/mL (ref 0.00–0.06)

## 2010-05-18 LAB — COMPREHENSIVE METABOLIC PANEL
ALT: 16 U/L (ref 0–53)
CO2: 29 mEq/L (ref 19–32)
Calcium: 9.7 mg/dL (ref 8.4–10.5)
Creatinine, Ser: 1.1 mg/dL (ref 0.4–1.5)
GFR calc non Af Amer: >60 mL/min (ref >60)
Glucose, Bld: 133 mg/dL — ABNORMAL HIGH (ref 70–99)
Sodium: 143 mEq/L (ref 135–145)
Total Protein: 8.4 g/dL — ABNORMAL HIGH (ref 6.0–8.3)

## 2010-05-18 LAB — CBC
HCT: 44.5 % (ref 39.0–52.0)
Hemoglobin: 15.3 g/dL (ref 13.0–17.0)
Hemoglobin: 14 g/dL (ref 13.0–17.0)
MCH: 31.5 pg (ref 26.0–34.0)
MCH: 32.5 pg (ref 26.0–34.0)
MCHC: 34.3 g/dL (ref 30.0–36.0)
MCV: 91.8 fL (ref 78.0–100.0)
Platelets: 194 10*3/uL (ref 150–400)
RBC: 4.31 MIL/uL (ref 4.22–5.81)
RDW: 12.3 % (ref 11.5–15.5)
WBC: 8.1 10*3/uL (ref 4.0–10.5)

## 2010-05-18 LAB — DIFFERENTIAL
Lymphocytes Relative: 13 % (ref 12–46)
Lymphs Abs: 1.6 10*3/uL (ref 0.7–4.0)
Monocytes Relative: 3 % (ref 3–12)
Neutro Abs: 9.7 10*3/uL — ABNORMAL HIGH (ref 1.7–7.7)
Neutrophils Relative %: 84 % — ABNORMAL HIGH (ref 43–77)

## 2010-05-18 LAB — PROTIME-INR
INR: 1.05 (ref 0.00–1.49)
Prothrombin Time: 13.6 seconds (ref 11.6–15.2)

## 2010-05-18 LAB — POCT CARDIAC MARKERS: Myoglobin, poc: 104 ng/mL (ref 12–200)

## 2010-05-18 LAB — APTT: aPTT: 30 seconds (ref 24–37)

## 2010-05-18 LAB — BASIC METABOLIC PANEL
CO2: 29 mEq/L (ref 19–32)
Chloride: 104 mEq/L (ref 96–112)
Creatinine, Ser: 1.12 mg/dL (ref 0.4–1.5)
GFR calc Af Amer: >60 mL/min (ref >60)
Potassium: 4.1 mEq/L (ref 3.5–5.1)
Sodium: 139 mEq/L (ref 135–145)

## 2010-05-18 LAB — CK TOTAL AND CKMB (NOT AT ARMC): Total CK: 193 U/L (ref 7–232)

## 2010-05-18 LAB — TSH: TSH: 1.27 u[IU]/mL (ref 0.350–4.500)

## 2010-05-18 LAB — D-DIMER, QUANTITATIVE: D-Dimer, Quant: <0.22 ug/mL-FEU (ref 0.00–0.48)

## 2010-05-18 LAB — LIPID PANEL
Triglycerides: 216 mg/dL — ABNORMAL HIGH (ref <150)
VLDL: 43 mg/dL — ABNORMAL HIGH (ref 0–40)

## 2010-05-18 LAB — TROPONIN I: Troponin I: <0.01 ng/mL (ref 0.00–0.06)

## 2010-05-21 NOTE — Miscellaneous (Signed)
Summary: Orders Update  Clinical Lists Changes  Orders: Added new Test order of Abdominal Aorta Duplex (Abd Aorta Duplex) - Signed 

## 2010-06-03 LAB — URINALYSIS, ROUTINE W REFLEX MICROSCOPIC
Bilirubin Urine: NEGATIVE
Glucose, UA: NEGATIVE mg/dL
Ketones, ur: 15 mg/dL — AB
Leukocytes, UA: NEGATIVE
Nitrite: NEGATIVE
Protein, ur: 30 mg/dL — AB
Specific Gravity, Urine: 1.029 (ref 1.005–1.030)
Urobilinogen, UA: 1 mg/dL (ref 0.0–1.0)
pH: 7 (ref 5.0–8.0)

## 2010-06-03 LAB — URINE MICROSCOPIC-ADD ON

## 2010-06-13 LAB — BASIC METABOLIC PANEL WITH GFR
CO2: 27 meq/L (ref 19–32)
Calcium: 9.4 mg/dL (ref 8.4–10.5)
GFR calc Af Amer: >60 mL/min (ref >60)
GFR calc non Af Amer: >60 mL/min (ref >60)
Sodium: 146 meq/L — ABNORMAL HIGH (ref 135–145)

## 2010-06-13 LAB — POCT CARDIAC MARKERS
CKMB, poc: 1.2 ng/mL (ref 1.0–8.0)
CKMB, poc: 1.2 ng/mL (ref 1.0–8.0)
CKMB, poc: 1.6 ng/mL (ref 1.0–8.0)
Myoglobin, poc: 101 ng/mL (ref 12–200)
Myoglobin, poc: 125 ng/mL (ref 12–200)
Myoglobin, poc: 74.5 ng/mL (ref 12–200)
Troponin i, poc: <0.05 ng/mL (ref 0.00–0.09)
Troponin i, poc: <0.05 ng/mL (ref 0.00–0.09)
Troponin i, poc: <0.05 ng/mL (ref 0.00–0.09)

## 2010-06-13 LAB — CBC
HCT: 48.7 % (ref 39.0–52.0)
Hemoglobin: 16.7 g/dL (ref 13.0–17.0)
MCHC: 34.3 g/dL (ref 30.0–36.0)
MCV: 92.5 fL (ref 78.0–100.0)
Platelets: 240 10*3/uL (ref 150–400)
RBC: 5.26 MIL/uL (ref 4.22–5.81)
RDW: 11.9 % (ref 11.5–15.5)
WBC: 6.7 K/uL (ref 4.0–10.5)

## 2010-06-13 LAB — DIFFERENTIAL
Basophils Absolute: 0 10*3/uL (ref 0.0–0.1)
Basophils Relative: 1 % (ref 0–1)
Eosinophils Absolute: 0.1 10*3/uL (ref 0.0–0.7)
Eosinophils Relative: 2 % (ref 0–5)
Lymphocytes Relative: 35 % (ref 12–46)
Lymphs Abs: 2.3 K/uL (ref 0.7–4.0)
Monocytes Absolute: 0.5 K/uL (ref 0.1–1.0)
Monocytes Relative: 7 % (ref 3–12)
Neutro Abs: 3.8 K/uL (ref 1.7–7.7)
Neutrophils Relative %: 56 % (ref 43–77)

## 2010-06-13 LAB — BASIC METABOLIC PANEL
BUN: 21 mg/dL (ref 6–23)
Chloride: 107 mEq/L (ref 96–112)
Creatinine, Ser: 1.1 mg/dL (ref 0.4–1.5)
Glucose, Bld: 102 mg/dL — ABNORMAL HIGH (ref 70–99)
Potassium: 4.1 mEq/L (ref 3.5–5.1)

## 2010-06-13 LAB — D-DIMER, QUANTITATIVE: D-Dimer, Quant: <0.22 ug/mL-FEU (ref 0.00–0.48)

## 2010-07-08 ENCOUNTER — Encounter: Payer: Self-pay | Admitting: Family Medicine

## 2010-07-08 ENCOUNTER — Ambulatory Visit (INDEPENDENT_AMBULATORY_CARE_PROVIDER_SITE_OTHER): Payer: BC Managed Care – PPO | Admitting: Family Medicine

## 2010-07-08 DIAGNOSIS — F438 Other reactions to severe stress: Secondary | ICD-10-CM

## 2010-07-08 DIAGNOSIS — M940 Chondrocostal junction syndrome [Tietze]: Secondary | ICD-10-CM

## 2010-07-08 DIAGNOSIS — M25519 Pain in unspecified shoulder: Secondary | ICD-10-CM

## 2010-07-08 DIAGNOSIS — R0789 Other chest pain: Secondary | ICD-10-CM

## 2010-07-08 DIAGNOSIS — R209 Unspecified disturbances of skin sensation: Secondary | ICD-10-CM

## 2010-07-08 NOTE — Patient Instructions (Signed)
Back Pain (Lumbosacral Strain) Back pain is one of the most common causes of pain. There are many causes of back pain. Most are not serious conditions.  CAUSES Your backbone (spinal column) is made up of 24 main vertebral bodies, the sacrum, and the coccyx. These are held together by muscles and tough, fibrous tissue (ligaments). Nerve roots pass through the openings between the vertebrae. A sudden move or injury to the back may cause injury to, or pressure on, these nerves. This may result in localized back pain or pain movement (radiation) into the buttocks, down the leg, and into the foot. Sharp, shooting pain from the buttock down the back of the leg (sciatica) is frequently associated with a ruptured (herniated) disc. Pain may be caused by muscle spasm alone. Your caregiver can often find the cause of your pain by the details of your symptoms and an exam. In some cases, you may need tests (such as X-rays). Your caregiver will work with you to decide if any tests are needed based on your specific exam. HOME CARE INSTRUCTIONS  Avoid an underactive lifestyle. Active exercise, as directed by your caregiver, is your greatest weapon against back pain.   Avoid hard physical activities (tennis, racquetball, water-skiing) if you are not in proper physical condition for it. This may aggravate and/or create problems.   If you have a back problem, avoid sports requiring sudden body movements. Swimming and walking are generally safer activities.   Maintain good posture.   Avoid becoming overweight (obese).   Use bed rest for only the most extreme, sudden (acute) episode. Your caregiver will help you determine how much bed rest is necessary.   For acute conditions, you may put ice on the injured area.   Put ice in a plastic bag.   Place a towel between your skin and the bag.     After you are improved and more active, it may help to apply heat for 30 minutes before activities.  See your caregiver  if you are having pain that lasts longer than expected. Your caregiver can advise appropriate exercises and/or therapy if needed. With conditioning, most back problems can be avoided. SEEK IMMEDIATE MEDICAL CARE IF:  You have numbness, tingling, weakness, or problems with the use of your arms or legs.   You experience severe back pain not relieved with medicines.   There is a change in bowel or bladder control.   You have increasing pain in any area of the body, including your belly (abdomen).   You notice shortness of breath, dizziness, or feel faint.   You feel sick to your stomach (nauseous), are throwing up (vomiting), or become sweaty.   You notice discoloration of your toes or legs, or your feet get very cold.   Your back pain is getting worse.   You have an oral temperature above 104, not controlled by medicine.  MAKE SURE YOU:   Understand these instructions.   Will watch your condition.   Will get help right away if you are not doing well or get worse.  Document Released: 11/27/2004 Document Re-Released: 05/14/2009 [facility] Patient Information 2011 [place], [place].

## 2010-07-09 ENCOUNTER — Encounter: Payer: Self-pay | Admitting: Family Medicine

## 2010-07-09 NOTE — Assessment & Plan Note (Signed)
We will monitor closely with patient, if the nature, intensity or quality of discomfort changes he will need to call or seek immediate help. For now, moist heat, gentle stretching, meds and weight maintenance with good activity level

## 2010-07-09 NOTE — Assessment & Plan Note (Signed)
Patient very tearful at this visit about his 43 yo mother's declining health but not interested in any treatments at this time

## 2010-07-09 NOTE — Assessment & Plan Note (Signed)
Patient is in today for follow up continues to have frequent trouble with left should pain with associated back, neck and cp. Worse after prolonged standing and certain activities, especially pitching at his son's little league games. He does acknowledge recently he had a steroid injection and he simultaneously took 1/2 a Tramadol and 1/2 a Cyclobenzaprine daily for a few days and he had 2-3 days pain free then he stopped the meds, got more active again and the pain returned again. He is encouraged to try a regimen of Yoga, weight maintenance, regular heat/ice, stretching and meds prn, can pursue other modalities such as Chiropractic, PT, massage or Acupuncture to try and control pain. Call if any changes or concerns

## 2010-07-09 NOTE — Assessment & Plan Note (Signed)
Cardiac work up was negative recently. Patient will manage musculoskeletal concerns and he will need to seek further care if nature or intensity of pain changes

## 2010-07-09 NOTE — Progress Notes (Signed)
Casey Miles [id number] 08/23/1965 07/09/2010      Progress Note-Follow Up  Subjective  Chief Complaint  Chief Complaint  Patient presents with  . Follow-up    HPI  Patient is a 45 consultation heation he is a full of multiple concerns. He continues to struggle with chronic left shoulder, chest pain, back pain. He does continue to struggle with paresthesias into the left hand at times. He did recently undergo a steroid injection and cervical spine followed by 2 days where he took a half a dose of tramadol and half dose of Flexeril daily and actually had 2-3 days pain free. Other than that he's had pain daily in his neck and shoulder. He has chest pain very specific around his sternum which is persistent as well his cardiac workup has been negative and he is not struggle with palpitations or shortness of breath. He continues to be very stressed about the nature of his discomfort and frustrated over his lack of exercise. He did see physiatry and was not offered any further guidance. Continues to seek chiropractic intermittently and tries to stay as active as he can tolerate. Otherwise he denies any difficulties with congestion, recent illness, chills, headache, shortness of breath, GI or GU complaints at this time is not offering any complaints of abdominal or epigastric pain at present. He does get tearful in discussing his mother in her poor state of health  Past Medical History  Diagnosis Date  . Kidney stone   . GERD (gastroesophageal reflux disease)   . Chest wall pain   . SHOULDER PAIN, LEFT 07/17/2008  . PARESTHESIA 11/22/2009  . Other acute reactions to stress 09/26/2009  . NECK PAIN 09/26/2009  . 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 COSTOCHONDRITIS, RECURRENT 05/06/2010  . Chronic rhinitis 09/26/2009  . CHEST PAIN, ATYPICAL 05/04/2008  . Abdominal pain, epigastric 09/26/2009    History reviewed. No pertinent past surgical history.  Family History  Problem Relation Age of  Onset  . Diabetes Mother   . Hypertension Mother   . Hyperlipidemia Mother   . Diabetes Maternal Grandmother   . Heart disease Maternal Grandmother   . Colitis Maternal Grandmother   . Diabetes Paternal Grandmother   . Irritable bowel syndrome    . Cancer      Esophageal- [employer]  . Stomach cancer      Uncle  . Colon polyps      Grandmother  . Colitis Maternal Grandfather   . Heart disease Maternal Grandfather   . Heart attack Father 47  . Diabetes Other     Family history of  . Hypertension Other     Family history of  . Heart attack Other     Family history of  . Irritable bowel syndrome Other     Family history of    History   Social History  . Marital Status: Married    Spouse Name: N/A    Number of Children: N/A  . Years of Education: N/A   Occupational History  . Not on file.   Social History Main Topics  . Smoking status: Never Smoker   . Smokeless tobacco: Never Used  . Alcohol Use: 0.0 oz/week    1-2 drink(s) per week     1-2 per week  . Drug Use: No  . Sexually Active: Not on file   Other Topics Concern  . Not on file   Social History Narrative  . No narrative on file  Current Outpatient Prescriptions on File Prior to Visit  Medication Sig Dispense Refill  . Alum & Mag Hydroxide-Simeth (MAALOX PLUS) 225-200-25 MG/5ML SUSP Take 10 mLs by mouth as needed.        . clidinium-chlordiazepoxide (LIBRAX) 2.5-5 MG per capsule Take 1 capsule by mouth 3 (three) times daily as needed.        . cyclobenzaprine (FLEXERIL) 10 MG tablet Take 10 mg by mouth at bedtime.        . diazepam (VALIUM) 5 MG tablet Take 5 mg by mouth 2 (two) times daily as needed.        . fish oil-omega-3 fatty acids 1000 MG capsule Take 1,000 mg by mouth 2 (two) times daily.        . nitroGLYCERIN (NITROSTAT) 0.4 MG SL tablet Place 0.4 mg under the tongue every 5 (five) minutes as needed. After 3 doses if not resolution to ER       . traMADol (ULTRAM) 50 MG tablet Take  50 mg by mouth 2 (two) times daily as needed. For pain       . trolamine salicylate (ASPERCREME) 10 % cream Apply topically as needed.          No Known Allergies  Review of Systems  Review of Systems  Constitutional: Positive for malaise/fatigue. Negative for fever.  HENT: Positive for neck pain. Negative for congestion.   Eyes: Negative for discharge.  Respiratory: Negative for shortness of breath.   Cardiovascular: Positive for chest pain. Negative for palpitations and leg swelling.  Gastrointestinal: Negative for nausea, abdominal pain and diarrhea.  Genitourinary: Negative for dysuria.  Musculoskeletal: Positive for back pain and joint pain. Negative for falls.       [Left shoulder Skin: Negative for rash.  Neurological: Negative for loss of consciousness and headaches.  Endo/Heme/Allergies: Negative for polydipsia.  Psychiatric/Behavioral: Negative for depression, suicidal ideas and hallucinations. The patient is nervous/anxious. The patient does not have insomnia.     Objective  BP 134/84  Pulse 68  Temp(Src) 97.9 F (36.6 C) (Oral)  Ht 5\' 8"  (1.727 m)  Wt 203 lb 12.8 oz (92.443 kg)  BMI 30.99 kg/m2  SpO2 100%  Physical Exam  Physical Exam  Constitutional: He is oriented to person, place, and time and well-developed, well-nourished, and in no distress. No distress.  HENT:  Head: Normocephalic and atraumatic.  Eyes: Conjunctivae are normal.  Neck: Neck supple. No thyromegaly present.  Cardiovascular: Normal rate, regular rhythm and normal heart sounds.   No murmur heard. Pulmonary/Chest: Effort normal and breath sounds normal. No respiratory distress.  Abdominal: He exhibits no distension and no mass. There is no tenderness.  Musculoskeletal: He exhibits no edema.  Neurological: He is alert and oriented to person, place, and time.  Skin: Skin is warm.  Psychiatric: Memory, affect and judgment normal.    Lab Results  Component Value Date   TSH 0.40  09/26/2009   Lab Results  Component Value Date   WBC 5.3 03/12/2010   HGB 14.9 03/12/2010   HCT 42.7 03/12/2010   MCV 91.6 03/12/2010   PLT 187 03/12/2010   Lab Results  Component Value Date   CREATININE 1.30 03/12/2010   BUN 21 03/12/2010   NA 141 03/12/2010   K 4.5 03/12/2010   CL 103 03/12/2010   CO2 29 03/12/2010   Lab Results  Component Value Date   ALT 18 11/03/2009   AST 26 11/03/2009   ALKPHOS 78 11/03/2009   BILITOT 0.8 11/03/2009  Lab Results  Component Value Date   CHOL  Value: 185        ATP III CLASSIFICATION:  <200     mg/dL   Desirable  604-540  mg/dL   Borderline High  >=981    mg/dL   High        1/91/4782   Lab Results  Component Value Date   HDL 37* 09/16/2009   Lab Results  Component Value Date   LDLCALC  Value: 105        Total Cholesterol/HDL:CHD Risk Coronary Heart Disease Risk Table                     Men   Women  1/2 Average Risk   3.4   3.3  Average Risk       5.0   4.4  2 X Average Risk   9.6   7.1  3 X Average Risk  23.4   11.0        Use the calculated Patient Ratio above and the CHD Risk Table to determine the patient's CHD Risk.        ATP III CLASSIFICATION (LDL):  <100     mg/dL   Optimal  956-213  mg/dL   Near or Above                    Optimal  130-159  mg/dL   Borderline  086-578  mg/dL   High  >469     mg/dL   Very High* 08/30/5282   Lab Results  Component Value Date   TRIG 216* 09/16/2009   Lab Results  Component Value Date   CHOLHDL 5.0 09/16/2009     Assessment & Plan  SHOULDER PAIN, LEFT Patient is in today for follow up continues to have frequent trouble with left should pain with associated back, neck and cp. Worse after prolonged standing and certain activities, especially pitching at his son's little league games. He does acknowledge recently he had a steroid injection and he simultaneously took 1/2 a Tramadol and 1/2 a Cyclobenzaprine daily for a few days and he had 2-3 days pain free then he stopped the meds, got more active again and the  pain returned again. He is encouraged to try a regimen of Yoga, weight maintenance, regular heat/ice, stretching and meds prn, can pursue other modalities such as Chiropractic, PT, massage or Acupuncture to try and control pain. Call if any changes or concerns  OTHER ACUTE REACTIONS TO STRESS  Patient very tearful at this visit about his 21 yo mother's declining health but not interested in any treatments at this time  COSTOCHONDRITIS, RECURRENT We will monitor closely with patient, if the nature, intensity or quality of discomfort changes he will need to call or seek immediate help. For now, moist heat, gentle stretching, meds and weight maintenance with good activity level  CHEST PAIN, ATYPICAL Cardiac work up was negative recently. Patient will manage musculoskeletal concerns and he will need to seek further care if nature or intensity of pain changes

## 2010-08-13 ENCOUNTER — Encounter: Payer: Self-pay | Admitting: Family Medicine

## 2010-08-13 ENCOUNTER — Ambulatory Visit (INDEPENDENT_AMBULATORY_CARE_PROVIDER_SITE_OTHER): Payer: BC Managed Care – PPO | Admitting: Family Medicine

## 2010-08-13 VITALS — BP 144/84 | HR 74 | Temp 98.1°F | Ht 68.0 in | Wt 208.4 lb

## 2010-08-13 DIAGNOSIS — M5030 Other cervical disc degeneration, unspecified cervical region: Secondary | ICD-10-CM

## 2010-08-13 DIAGNOSIS — R030 Elevated blood-pressure reading, without diagnosis of hypertension: Secondary | ICD-10-CM

## 2010-08-13 DIAGNOSIS — J029 Acute pharyngitis, unspecified: Secondary | ICD-10-CM

## 2010-08-13 DIAGNOSIS — M549 Dorsalgia, unspecified: Secondary | ICD-10-CM

## 2010-08-13 DIAGNOSIS — M2550 Pain in unspecified joint: Secondary | ICD-10-CM | POA: Insufficient documentation

## 2010-08-13 DIAGNOSIS — R079 Chest pain, unspecified: Secondary | ICD-10-CM

## 2010-08-13 DIAGNOSIS — R1013 Epigastric pain: Secondary | ICD-10-CM

## 2010-08-13 HISTORY — DX: Pain in unspecified joint: M25.50

## 2010-08-13 LAB — CBC WITH DIFFERENTIAL/PLATELET
Eosinophils Relative: 0.7 % (ref 0.0–5.0)
HCT: 41.7 % (ref 39.0–52.0)
Hemoglobin: 14.6 g/dL (ref 13.0–17.0)
Lymphs Abs: 1.8 10*3/uL (ref 0.7–4.0)
Monocytes Relative: 6.7 % (ref 3.0–12.0)
Neutro Abs: 3.1 10*3/uL (ref 1.4–7.7)
RDW: 12.7 % (ref 11.5–14.6)
WBC: 5.3 10*3/uL (ref 4.5–10.5)

## 2010-08-13 LAB — LIPID PANEL
LDL Cholesterol: 114 mg/dL — ABNORMAL HIGH (ref 0–99)
Total CHOL/HDL Ratio: 5
Triglycerides: 188 mg/dL — ABNORMAL HIGH (ref 0.0–149.0)

## 2010-08-13 LAB — RENAL FUNCTION PANEL
Albumin: 4.6 g/dL (ref 3.5–5.2)
BUN: 15 mg/dL (ref 6–23)
Calcium: 9.1 mg/dL (ref 8.4–10.5)
Glucose, Bld: 96 mg/dL (ref 70–99)
Potassium: 4.6 mEq/L (ref 3.5–5.1)

## 2010-08-13 LAB — HEPATIC FUNCTION PANEL
Albumin: 4.6 g/dL (ref 3.5–5.2)
Alkaline Phosphatase: 54 U/L (ref 39–117)

## 2010-08-13 NOTE — Assessment & Plan Note (Signed)
Symptoms began 3 days ago and are not worsening lesion on oropharynx is on left side and viral in appearance, Encouraged increased fluids, OTC pain meds and gargling with mouthwash bid, monitor lesion for resolution if the lesion does not resolve, may need further evaluation, patient instructed to call.

## 2010-08-13 NOTE — Patient Instructions (Signed)
Pharyngitis (Viral and Bacterial) Pharyngitis is soreness (inflammation) or infection of the pharynx. It is also called a sore throat. CAUSES Most sore throats are caused by viruses and are part of a cold. However, some sore throats are caused by strep and other bacteria. Sore throats can also be caused by post nasal drip from draining sinuses, allergies and sometimes from sleeping with an open mouth. Infectious sore throats can be spread from person to person by coughing, sneezing and sharing cups or eating utensils. TREATMENT Sore throats that are viral usually last 3-4 days. Viral illness will get better without medications (antibiotics). Strep throat and other bacterial infections will usually begin to get better about 24-48 hours after you begin to take antibiotics. HOME CARE INSTRUCTIONS  If the caregiver feels there is a bacterial infection or if there is a positive strep test, they will prescribe an antibiotic. The full course of antibiotics must be taken!! If the full course of antibiotic is not taken, you or your child may become ill again. If you or your child has strep throat and do not finish all of the medication, serious heart or kidney diseases may develop.   Drink enough water and fluids to keep your urine clear or pale yellow.   Only take over-the-counter or prescription medicines for pain, discomfort or fever as directed by your caregiver.   Get lots of rest.   Gargle with salt water ( tsp. of salt in a glass of water) as often as every 1-2 hours as you need for comfort.   Hard candies may soothe the throat if individual is not at risk for choking. Throat sprays or lozenges may also be used.  SEEK MEDICAL CARE IF:  Large, tender lumps in the neck develop.   A rash develops.   Green, yellow-brown or bloody sputum is coughed up.   You or your child has an oral temperature above 102 F (38.9 C).   Your baby is older than 3 months with a rectal temperature of 100.5 F  (38.1 C) or higher for more than 1 day.  SEEK IMMEDIATE MEDICAL CARE IF:  A stiff neck develops.   You or your child are drooling or unable to swallow liquids.   You or your child are vomiting, unable to keep medications or liquids down.   You or your child has severe pain, unrelieved with recommended medications.   You or your child are having difficulty breathing (not due to stuffy nose).   You or your child are unable to fully open your mouth.   You or your child develop redness, swelling, or severe pain anywhere on the neck.   You or your child has an oral temperature above 102 F (38.9 C), not controlled by medicine.   Your baby is older than 3 months with a rectal temperature of 102 F (38.9 C) or higher.   Your baby is 3 months old or younger with a rectal temperature of 100.4 F (38 C) or higher.  MAKE SURE YOU:   Understand these instructions.   Will watch your condition.   Will get help right away if you are not doing well or get worse.  Document Released: 02/17/2005 Document Re-Released: 08/07/2009 [employer] Patient Information 2011 [employer]. 

## 2010-08-13 NOTE — Assessment & Plan Note (Signed)
Patient has had increased diffuse back pain with migratory chest wall pain, was seen by his chiropractor yesterday without any significant relief

## 2010-08-13 NOTE — Assessment & Plan Note (Signed)
No c/o today, no heartburn, reflux, anorexia

## 2010-08-13 NOTE — Assessment & Plan Note (Signed)
Patient with diffuse c/o pain over many years. He is noting b/l elbow pain for at least the past 7 years, intermittent b/l hip and knee pain. We have been trying to manage diffuse back pain and chest pain as well. We will run some inflammatory blood work and a few rheumatologic markers today and refer to rheumatology for further evaluation.

## 2010-08-13 NOTE — Progress Notes (Signed)
Casey Miles [id number] Jul 20, 1965 08/13/2010      Progress Note-Follow Up  Subjective  Chief Complaint  Chief Complaint  Patient presents with  . Sore Throat    spot on throat X 3 days    HPI  Patient is a 45 year old Caucasian male who is in today with a three-day history of throat irritation. He describes it as painful. Initially the discomfort on the left but this morning he does note some mild irritation on the right as well. No obvious high-grade fevers chills low he has been getting excessive sun and feeling flushed at times. Does have some mild headaches none at the present time. He denies dysphagia or heartburn no reflux symptoms. Minor congestion with some postnasal drip has been present for months now and is unchanged. Does have some clear sputum production at times. No perfect cough just some mild irritation which causes a mild cough at times. No ear pain. No palpitations no shortness of breath no GI or GU complaints. He denies reflux, epigastric pain, anorexia, nausea vomiting, diarrhea. He does have ongoing back pain and saw his chiropractor yesterday. His back pain in his neck his thoracic spine he is still present he has pain all the way down to the lumbar spine at this point. Also complains of posterior bilateral hip pain. Complains of bilateral elbow pain over the medial epicondyle release for 7 years now. No associated erythema or swelling. Also complains of intermittent pain on the left. Is considering a rheumatology evaluation due to our lack of answers in managing his other pains. He continues to have chest wall pain with discomfort to palpation over on his lower ribs bilaterally. Today it is worse on the left than the right but he has had trouble recently on the right as well. Chiropractic adjustment did not significantly improve his pain at this time but he will continue with care here  Past Medical History  Diagnosis Date  . Kidney stone   . GERD (gastroesophageal reflux  disease)   . Chest wall pain   . SHOULDER PAIN, LEFT 07/17/2008  . PARESTHESIA 11/22/2009  . Other acute reactions to stress 09/26/2009  . NECK PAIN 09/26/2009  . 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 COSTOCHONDRITIS, RECURRENT 05/06/2010  . Chronic rhinitis 09/26/2009  . CHEST PAIN, ATYPICAL 05/04/2008  . Abdominal pain, epigastric 09/26/2009  . Arthralgia of multiple sites 08/13/2010    History reviewed. No pertinent past surgical history.  Family History  Problem Relation Age of Onset  . Diabetes Mother   . Hypertension Mother   . Hyperlipidemia Mother   . Diabetes Maternal Grandmother   . Heart disease Maternal Grandmother   . Colitis Maternal Grandmother   . Diabetes Paternal Grandmother   . Irritable bowel syndrome    . Cancer      Esophageal- [employer]  . Stomach cancer      Uncle  . Colon polyps      Grandmother  . Colitis Maternal Grandfather   . Heart disease Maternal Grandfather   . Heart attack Father 75  . Diabetes Other     Family history of  . Hypertension Other     Family history of  . Heart attack Other     Family history of  . Irritable bowel syndrome Other     Family history of    History   Social History  . Marital Status: Married    Spouse Name: N/A    Number of Children: N/A  .  Years of Education: N/A   Occupational History  . Not on file.   Social History Main Topics  . Smoking status: Never Smoker   . Smokeless tobacco: Never Used  . Alcohol Use: 0.0 oz/week    1-2 drink(s) per week     1-2 per week  . Drug Use: No  . Sexually Active: Not on file   Other Topics Concern  . Not on file   Social History Narrative  . No narrative on file    Current Outpatient Prescriptions on File Prior to Visit  Medication Sig Dispense Refill  . Alum & Mag Hydroxide-Simeth (MAALOX PLUS) 225-200-25 MG/5ML SUSP Take 10 mLs by mouth as needed.        . clidinium-chlordiazepoxide (LIBRAX) 2.5-5 MG per capsule Take 1 capsule by  mouth 3 (three) times daily as needed.        . cyclobenzaprine (FLEXERIL) 10 MG tablet Take 10 mg by mouth at bedtime.        . diazepam (VALIUM) 5 MG tablet Take 5 mg by mouth 2 (two) times daily as needed.        . fish oil-omega-3 fatty acids 1000 MG capsule Take 1,000 mg by mouth 2 (two) times daily.        . nitroGLYCERIN (NITROSTAT) 0.4 MG SL tablet Place 0.4 mg under the tongue every 5 (five) minutes as needed. After 3 doses if not resolution to ER       . traMADol (ULTRAM) 50 MG tablet Take 50 mg by mouth 2 (two) times daily as needed. For pain       . trolamine salicylate (ASPERCREME) 10 % cream Apply topically as needed.          No Known Allergies  Review of Systems  Review of Systems  Constitutional: Positive for malaise/fatigue. Negative for fever.       Does question if he has had some low grade fevers due to excess sun exposure over the weekend with some flushing. He also notes an occasional mild HA but blames that on dehydration from working in the yard  HENT: Positive for congestion, sore throat and neck pain. Negative for ear discharge.   Eyes: Negative for discharge.  Respiratory: Positive for sputum production. Negative for shortness of breath.        Clear sputum production noted over past couple of months. No fevers, chills, chest tightness, does have persistent chest wall discomfort.  Cardiovascular: Positive for chest pain. Negative for palpitations, leg swelling and PND.  Gastrointestinal: Negative for nausea, abdominal pain and diarrhea.  Genitourinary: Negative for dysuria.  Musculoskeletal: Positive for myalgias, back pain and joint pain. Negative for falls.  Skin: Negative for rash.  Neurological: Negative for loss of consciousness and headaches.  Endo/Heme/Allergies: Negative for polydipsia.  Psychiatric/Behavioral: Negative for depression and suicidal ideas. The patient is not nervous/anxious and does not have insomnia.     Objective  BP 144/84  Pulse  74  Temp(Src) 98.1 F (36.7 C) (Oral)  Ht 5\' 8"  (1.727 m)  Wt 208 lb 6.4 oz (94.53 kg)  BMI 31.69 kg/m2  SpO2 99%  Physical Exam  Physical Exam  Constitutional: He is oriented to person, place, and time and well-developed, well-nourished, and in no distress. No distress.  HENT:  Head: Normocephalic and atraumatic.  Right Ear: External ear normal.  Left Ear: External ear normal.  Nose: Nose normal.  Mouth/Throat: No oropharyngeal exudate.       1 cm flat apthous type  ulcer in oropharynx on left, no surrounding exudate, swelling or erythema  Eyes: Conjunctivae are normal.  Neck: Neck supple. No thyromegaly present.  Cardiovascular: Normal rate, regular rhythm and normal heart sounds.   No murmur heard. Pulmonary/Chest: Effort normal and breath sounds normal. No respiratory distress. He has no wheezes. He exhibits tenderness.       Lower chest wall tender with palp on left side.  Abdominal: He exhibits no distension and no mass. There is no tenderness.  Musculoskeletal: He exhibits no edema.  Lymphadenopathy:    He has no cervical adenopathy.  Neurological: He is alert and oriented to person, place, and time.  Skin: Skin is warm. No erythema.  Psychiatric: Memory and judgment normal.    Lab Results  Component Value Date   TSH 0.40 09/26/2009   Lab Results  Component Value Date   WBC 5.3 03/12/2010   HGB 14.9 03/12/2010   HCT 42.7 03/12/2010   MCV 91.6 03/12/2010   PLT 187 03/12/2010   Lab Results  Component Value Date   CREATININE 1.30 03/12/2010   BUN 21 03/12/2010   NA 141 03/12/2010   K 4.5 03/12/2010   CL 103 03/12/2010   CO2 29 03/12/2010   Lab Results  Component Value Date   ALT 18 11/03/2009   AST 26 11/03/2009   ALKPHOS 78 11/03/2009   BILITOT 0.8 11/03/2009   Lab Results  Component Value Date   CHOL  Value: 185        ATP III CLASSIFICATION:  <200     mg/dL   Desirable  811-914  mg/dL   Borderline High  >=782    mg/dL   High        9/56/2130   Lab Results    Component Value Date   HDL 37* 09/16/2009   Lab Results  Component Value Date   LDLCALC  Value: 105        Total Cholesterol/HDL:CHD Risk Coronary Heart Disease Risk Table                     Men   Women  1/2 Average Risk   3.4   3.3  Average Risk       5.0   4.4  2 X Average Risk   9.6   7.1  3 X Average Risk  23.4   11.0        Use the calculated Patient Ratio above and the CHD Risk Table to determine the patient's CHD Risk.        ATP III CLASSIFICATION (LDL):  <100     mg/dL   Optimal  865-784  mg/dL   Near or Above                    Optimal  130-159  mg/dL   Borderline  696-295  mg/dL   High  >284     mg/dL   Very High* 1/32/4401   Lab Results  Component Value Date   TRIG 216* 09/16/2009   Lab Results  Component Value Date   CHOLHDL 5.0 09/16/2009     Assessment & Plan  Pharyngitis Symptoms began 3 days ago and are not worsening lesion on oropharynx is on left side and viral in appearance, Encouraged increased fluids, OTC pain meds and gargling with mouthwash bid, monitor lesion for resolution if the lesion does not resolve, may need further evaluation, patient instructed to call.  Arthralgia of multiple sites Patient with diffuse  c/o pain over many years. He is noting b/l elbow pain for at least the past 7 years, intermittent b/l hip and knee pain. We have been trying to manage diffuse back pain and chest pain as well. We will run some inflammatory blood work and a few rheumatologic markers today and refer to rheumatology for further evaluation.  ABDOMINAL PAIN, EPIGASTRIC No c/o today, no heartburn, reflux, anorexia  DEGENERATIVE DISC DISEASE, CERVICAL SPINE Patient has had increased diffuse back pain with migratory chest wall pain, was seen by his chiropractor yesterday without any significant relief

## 2010-08-14 LAB — ANTI-NUCLEAR AB-TITER (ANA TITER): ANA Titer 1: 1:40 {titer} — ABNORMAL HIGH

## 2010-08-14 LAB — RHEUMATOID FACTOR: Rhuematoid fact SerPl-aCnc: <10 IU/mL (ref <=14)

## 2010-08-14 LAB — C-REACTIVE PROTEIN: CRP: 0.1 mg/dL (ref <0.6)

## 2010-10-08 ENCOUNTER — Ambulatory Visit (INDEPENDENT_AMBULATORY_CARE_PROVIDER_SITE_OTHER): Payer: BC Managed Care – PPO | Admitting: Family Medicine

## 2010-10-08 ENCOUNTER — Encounter: Payer: Self-pay | Admitting: Family Medicine

## 2010-10-08 DIAGNOSIS — E785 Hyperlipidemia, unspecified: Secondary | ICD-10-CM

## 2010-10-08 DIAGNOSIS — M549 Dorsalgia, unspecified: Secondary | ICD-10-CM

## 2010-10-08 DIAGNOSIS — M2550 Pain in unspecified joint: Secondary | ICD-10-CM

## 2010-10-08 DIAGNOSIS — M940 Chondrocostal junction syndrome [Tietze]: Secondary | ICD-10-CM

## 2010-10-08 HISTORY — DX: Hyperlipidemia, unspecified: E78.5

## 2010-10-08 MED ORDER — BACLOFEN 10 MG PO TABS: ORAL_TABLET | ORAL | Status: DC

## 2010-10-08 NOTE — Assessment & Plan Note (Signed)
Seen by rheumatology recently consider the possibility of myofascial pain syndrome, no new meds, has used some Cyclobenzaprine

## 2010-10-08 NOTE — Assessment & Plan Note (Signed)
Continues to follow with neurosurgery, has seen ortho and now rheumatology secondary to an elevated ANA and his diagnosis is still unclear. Consideration is given to myofascial pain syndrome. Flexeril caused sedation, we will try Baclofen prn and see him in follow up in a few weeks, he will report if his symptoms worsen or change for furhter consideration

## 2010-10-08 NOTE — Patient Instructions (Signed)
Myofascial Pain Syndrome You have a condition commonly called myofascial pain syndrome. This condition causes a wide variety of symptoms. The problems may include: aching, cramping, burning, numbness, tingling, and other uncomfortable sensations in muscular areas. It most commonly affects the neck, upper back, and shoulder areas. Pain often radiates into the arms and hands. Myofascial pain may be caused by injuries, especially auto accidents, or by overuse of certain muscles. Typically the pain is long lasting. It is made worse by overuse of the involved muscles, by emotional distress, and by cold, damp weather.  Treatment includes resting the affected muscular area and applying ice packs or moist heat to reduce spasm and pain. Stretching exercises to loosen up the muscles are also useful. Anti-inflammatory pain medicine can be very helpful. Trigger points are often present at the source of pain and injections at these points can also be used. Symptoms will gradually improve over a period of weeks to months with proper treatment. Call your caregiver for follow-up care as recommended.  SEEK MEDICAL CARE IF:   Your pain is severe and not helped with medications.  Document Released: 03/27/2004 Document Re-Released: 05/16/2008 [facility] Patient Information 2011 [place], [place].  Try adding a Multivitamin with minerals a couple times a week tof the muscle irritability

## 2010-10-08 NOTE — Assessment & Plan Note (Signed)
Mild elevation in LDL cholesterol and TGs, avoid trans fats, encouraged more consistent fish oil and encouraged increased exercise levels

## 2010-10-08 NOTE — Progress Notes (Signed)
Casey Miles [id number] 12-07-1965 10/08/2010      Progress Note-Follow Up  Subjective  Chief Complaint  Chief Complaint  Patient presents with  . Follow-up    3 month follow up    HPI  Patient is a 45 yo caucasian male in today for follow up on chronic musculoskeletal pain. Some days are better than others but he struggle with daily diffuse pain. He has CP/shoulder pain/arm pain/abdominal pain/back pain etc. He notes some paresthesias occur randomly as well. His oddest c/o is that 80% of the time when he urinates he gets a tingling sensation in his face. No recent acute illness, fevers, chills, palp, sob, gi or gu c/o.  Past Medical History  Diagnosis Date  . Kidney stone   . GERD (gastroesophageal reflux disease)   . Chest wall pain   . SHOULDER PAIN, LEFT 07/17/2008  . PARESTHESIA 11/22/2009  . Other acute reactions to stress 09/26/2009  . NECK PAIN 09/26/2009  . 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 COSTOCHONDRITIS, RECURRENT 05/06/2010  . Chronic rhinitis 09/26/2009  . CHEST PAIN, ATYPICAL 05/04/2008  . Abdominal pain, epigastric 09/26/2009  . Arthralgia of multiple sites 08/13/2010  . Hyperlipidemia 10/08/2010    History reviewed. No pertinent past surgical history.  Family History  Problem Relation Age of Onset  . Diabetes Mother   . Hypertension Mother   . Hyperlipidemia Mother   . Diabetes Maternal Grandmother   . Heart disease Maternal Grandmother   . Colitis Maternal Grandmother   . Diabetes Paternal Grandmother   . Irritable bowel syndrome    . Cancer      Esophageal- [employer]  . Stomach cancer      Uncle  . Colon polyps      Grandmother  . Colitis Maternal Grandfather   . Heart disease Maternal Grandfather   . Heart attack Father 5  . Diabetes Other     Family history of  . Hypertension Other     Family history of  . Heart attack Other     Family history of  . Irritable bowel syndrome Other     Family history of    History     Social History  . Marital Status: Married    Spouse Name: N/A    Number of Children: N/A  . Years of Education: N/A   Occupational History  . Not on file.   Social History Main Topics  . Smoking status: Never Smoker   . Smokeless tobacco: Never Used  . Alcohol Use: 0.0 oz/week    1-2 drink(s) per week     1-2 per week  . Drug Use: No  . Sexually Active: Not on file   Other Topics Concern  . Not on file   Social History Narrative  . No narrative on file    Current Outpatient Prescriptions on File Prior to Visit  Medication Sig Dispense Refill  . Alum & Mag Hydroxide-Simeth (MAALOX PLUS) 225-200-25 MG/5ML SUSP Take 10 mLs by mouth as needed.        . clidinium-chlordiazepoxide (LIBRAX) 2.5-5 MG per capsule Take 1 capsule by mouth 3 (three) times daily as needed.        . diazepam (VALIUM) 5 MG tablet Take 5 mg by mouth 2 (two) times daily as needed.        . fish oil-omega-3 fatty acids 1000 MG capsule Take 1,000 mg by mouth 2 (two) times daily.        Marland Kitchen  nitroGLYCERIN (NITROSTAT) 0.4 MG SL tablet Place 0.4 mg under the tongue every 5 (five) minutes as needed. After 3 doses if not resolution to ER       . traMADol (ULTRAM) 50 MG tablet Take 50 mg by mouth 2 (two) times daily as needed. For pain       . trolamine salicylate (ASPERCREME) 10 % cream Apply topically as needed.          No Known Allergies  Review of Systems  Review of Systems  Constitutional: Negative for fever and malaise/fatigue.  HENT: Positive for neck pain. Negative for congestion.   Eyes: Negative for discharge.  Respiratory: Negative for shortness of breath.   Cardiovascular: Negative for chest pain, palpitations and leg swelling.  Gastrointestinal: Negative for nausea, abdominal pain and diarrhea.  Genitourinary: Negative for dysuria.  Musculoskeletal: Positive for myalgias and joint pain. Negative for falls.  Skin: Negative for rash.  Neurological: Negative for loss of consciousness and  headaches.  Endo/Heme/Allergies: Negative for polydipsia.  Psychiatric/Behavioral: Negative for depression and suicidal ideas. The patient is not nervous/anxious and does not have insomnia.     Objective  BP 130/84  Pulse 88  Temp(Src) 98.2 F (36.8 C) (Oral)  Ht 5\' 8"  (1.727 m)  Wt 204 lb 6.4 oz (92.715 kg)  BMI 31.08 kg/m2  SpO2 99%  Physical Exam  Physical Exam  Constitutional: He is oriented to person, place, and time and well-developed, well-nourished, and in no distress. No distress.  HENT:  Head: Normocephalic and atraumatic.  Eyes: Conjunctivae are normal.  Neck: Neck supple. No thyromegaly present.  Cardiovascular: Normal rate, regular rhythm and normal heart sounds.   No murmur heard. Pulmonary/Chest: Effort normal and breath sounds normal. No respiratory distress.  Abdominal: He exhibits no distension and no mass. There is no tenderness.  Musculoskeletal: He exhibits no edema.  Neurological: He is alert and oriented to person, place, and time.  Skin: Skin is warm.  Psychiatric: Memory, affect and judgment normal.    Lab Results  Component Value Date   TSH 0.65 08/13/2010   Lab Results  Component Value Date   WBC 5.3 08/13/2010   HGB 14.6 08/13/2010   HCT 41.7 08/13/2010   MCV 93.3 08/13/2010   PLT 191.0 08/13/2010   Lab Results  Component Value Date   CREATININE 1.1 08/13/2010   BUN 15 08/13/2010   NA 139 08/13/2010   K 4.6 08/13/2010   CL 104 08/13/2010   CO2 29 08/13/2010   Lab Results  Component Value Date   ALT 21 08/13/2010   AST 26 08/13/2010   ALKPHOS 54 08/13/2010   BILITOT 0.9 08/13/2010   Lab Results  Component Value Date   CHOL 190 08/13/2010   Lab Results  Component Value Date   HDL 38.70* 08/13/2010   Lab Results  Component Value Date   LDLCALC 114* 08/13/2010   Lab Results  Component Value Date   TRIG 188.0* 08/13/2010   Lab Results  Component Value Date   CHOLHDL 5 08/13/2010     Assessment & Plan  Arthralgia of multiple  sites Seen by rheumatology recently consider the possibility of myofascial pain syndrome, no new meds, has used some Cyclobenzaprine   COSTOCHONDRITIS, RECURRENT Continues to follow with neurosurgery, has seen ortho and now rheumatology secondary to an elevated ANA and his diagnosis is still unclear. Consideration is given to myofascial pain syndrome. Flexeril caused sedation, we will try Baclofen prn and see him in follow up in a  few weeks, he will report if his symptoms worsen or change for furhter consideration  Hyperlipidemia Mild elevation in LDL cholesterol and TGs, avoid trans fats, encouraged more consistent fish oil and encouraged increased exercise levels

## 2011-01-08 ENCOUNTER — Ambulatory Visit (INDEPENDENT_AMBULATORY_CARE_PROVIDER_SITE_OTHER): Payer: BC Managed Care – PPO | Admitting: Family Medicine

## 2011-01-08 ENCOUNTER — Encounter: Payer: Self-pay | Admitting: Family Medicine

## 2011-01-08 VITALS — BP 136/86 | HR 71 | Temp 98.2°F | Ht 68.0 in | Wt 203.0 lb

## 2011-01-08 DIAGNOSIS — Z23 Encounter for immunization: Secondary | ICD-10-CM

## 2011-01-08 DIAGNOSIS — R5381 Other malaise: Secondary | ICD-10-CM

## 2011-01-08 DIAGNOSIS — R202 Paresthesia of skin: Secondary | ICD-10-CM

## 2011-01-08 DIAGNOSIS — E785 Hyperlipidemia, unspecified: Secondary | ICD-10-CM

## 2011-01-08 DIAGNOSIS — R209 Unspecified disturbances of skin sensation: Secondary | ICD-10-CM

## 2011-01-08 DIAGNOSIS — R5383 Other fatigue: Secondary | ICD-10-CM

## 2011-01-08 DIAGNOSIS — R1013 Epigastric pain: Secondary | ICD-10-CM

## 2011-01-08 HISTORY — DX: Other fatigue: R53.83

## 2011-01-08 NOTE — Progress Notes (Signed)
Casey Miles [id number] 1965/06/12 01/08/2011      Progress Note-Follow Up  Subjective  Chief Complaint  Chief Complaint  Patient presents with  . Follow-up    3 month follow up    HPI  Patient is a 45 year old Caucasian male who is in today for routine followup. He continues to show with the paresthesias and discomfort down his left upper extremity and into his back. Has this intermittent chest pain that he says appears randomly. Does not seem to be affected by a specific movement but he does note he tends to be worse on days when she is off his feet longer and more active he notes now he's developing some intermittent low back pain when he saw his feet for long time and some radicular symptoms occasionally down his left leg. He also notes some tingling in the left side of his face when he urinates at times but not every time. He is started with a new neurologist Dr. Mary Sella neurology. They have scheduled an EMG for his left upper extremity tomorrow and from there we'll decide what they'll do a workup he needs. The chest discomfort is somewhat better. No palpitations or shortness of breath. No recent febrile illness. He does occasionally still have some sour taste and tightness in his throat and heartburn as well as epigastric pain. He uses a dose of Mylanta or Maalox and gets relief most of the time. Has not used any doses of Librax for pain stop in time. He used Valium periodically in the past but none for the last 2 months and he has not used tramadol for roughly 5 months. Overall he manages his pain well. No recent fevers chills, congestion or acute complaints.  Past Medical History  Diagnosis Date  . Kidney stone   . GERD (gastroesophageal reflux disease)   . Chest wall pain   . SHOULDER PAIN, LEFT 07/17/2008  . PARESTHESIA 11/22/2009  . Other acute reactions to stress 09/26/2009  . NECK PAIN 09/26/2009  . 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 COSTOCHONDRITIS,  RECURRENT 05/06/2010  . Chronic rhinitis 09/26/2009  . CHEST PAIN, ATYPICAL 05/04/2008  . Abdominal pain, epigastric 09/26/2009  . Arthralgia of multiple sites 08/13/2010  . Hyperlipidemia 10/08/2010  . Fatigue 01/08/2011    History reviewed. No pertinent past surgical history.  Family History  Problem Relation Age of Onset  . Diabetes Mother   . Hypertension Mother   . Hyperlipidemia Mother   . Diabetes Maternal Grandmother   . Heart disease Maternal Grandmother   . Colitis Maternal Grandmother   . Diabetes Paternal Grandmother   . Irritable bowel syndrome    . Cancer      Esophageal- [employer]  . Stomach cancer      Uncle  . Colon polyps      Grandmother  . Colitis Maternal Grandfather   . Heart disease Maternal Grandfather   . Heart attack Father 75  . Diabetes Other     Family history of  . Hypertension Other     Family history of  . Heart attack Other     Family history of  . Irritable bowel syndrome Other     Family history of    History   Social History  . Marital Status: Married    Spouse Name: N/A    Number of Children: N/A  . Years of Education: N/A   Occupational History  . Not on file.   Social History Main Topics  .  Smoking status: Never Smoker   . Smokeless tobacco: Never Used  . Alcohol Use: 0.0 oz/week    1-2 drink(s) per week     1-2 per week  . Drug Use: No  . Sexually Active: Not on file   Other Topics Concern  . Not on file   Social History Narrative  . No narrative on file    Current Outpatient Prescriptions on File Prior to Visit  Medication Sig Dispense Refill  . Alum & Mag Hydroxide-Simeth (MAALOX PLUS) 225-200-25 MG/5ML SUSP Take 10 mLs by mouth as needed.        . baclofen (LIORESAL) 10 MG tablet 1-2 tabs po bid prn pain  40 tablet  1  . clidinium-chlordiazepoxide (LIBRAX) 2.5-5 MG per capsule Take 1 capsule by mouth 3 (three) times daily as needed.        . diazepam (VALIUM) 5 MG tablet Take 5 mg by mouth 2 (two) times  daily as needed.        . fish oil-omega-3 fatty acids 1000 MG capsule Take 1,000 mg by mouth 2 (two) times daily.        . nitroGLYCERIN (NITROSTAT) 0.4 MG SL tablet Place 0.4 mg under the tongue every 5 (five) minutes as needed. After 3 doses if not resolution to ER       . traMADol (ULTRAM) 50 MG tablet Take 50 mg by mouth 2 (two) times daily as needed. For pain       . trolamine salicylate (ASPERCREME) 10 % cream Apply topically as needed.          No Known Allergies  Review of Systems  Review of Systems  Constitutional: Positive for malaise/fatigue. Negative for fever.  HENT: Positive for neck pain. Negative for congestion.   Eyes: Negative for discharge.  Respiratory: Negative for shortness of breath.   Cardiovascular: Positive for chest pain. Negative for palpitations and leg swelling.  Gastrointestinal: Negative for nausea, abdominal pain and diarrhea.  Genitourinary: Negative for dysuria.  Musculoskeletal: Positive for myalgias. Negative for falls.  Skin: Negative for rash.  Neurological: Positive for tingling and sensory change. Negative for seizures, loss of consciousness and headaches.  Endo/Heme/Allergies: Negative for polydipsia.  Psychiatric/Behavioral: Negative for depression and suicidal ideas. The patient is not nervous/anxious and does not have insomnia.     Objective  BP 136/86  Pulse 71  Temp(Src) 98.2 F (36.8 C) (Oral)  Ht 5\' 8"  (1.727 m)  Wt 203 lb (92.08 kg)  BMI 30.87 kg/m2  SpO2 100%  Physical Exam  Physical Exam  Constitutional: He is oriented to person, place, and time and well-developed, well-nourished, and in no distress. No distress.  HENT:  Head: Normocephalic and atraumatic.  Eyes: Conjunctivae are normal.  Neck: Neck supple. No thyromegaly present.  Cardiovascular: Normal rate, regular rhythm and normal heart sounds.   No murmur heard. Pulmonary/Chest: Effort normal and breath sounds normal. No respiratory distress.  Abdominal: He  exhibits no distension and no mass. There is no tenderness.  Musculoskeletal: He exhibits no edema.  Neurological: He is alert and oriented to person, place, and time.  Skin: Skin is warm.  Psychiatric: Memory, affect and judgment normal.    Lab Results  Component Value Date   TSH 0.65 08/13/2010   Lab Results  Component Value Date   WBC 5.3 08/13/2010   HGB 14.6 08/13/2010   HCT 41.7 08/13/2010   MCV 93.3 08/13/2010   PLT 191.0 08/13/2010   Lab Results  Component Value Date  CREATININE 1.1 08/13/2010   BUN 15 08/13/2010   NA 139 08/13/2010   K 4.6 08/13/2010   CL 104 08/13/2010   CO2 29 08/13/2010   Lab Results  Component Value Date   ALT 21 08/13/2010   AST 26 08/13/2010   ALKPHOS 54 08/13/2010   BILITOT 0.9 08/13/2010   Lab Results  Component Value Date   CHOL 190 08/13/2010   Lab Results  Component Value Date   HDL 38.70* 08/13/2010   Lab Results  Component Value Date   LDLCALC 114* 08/13/2010   Lab Results  Component Value Date   TRIG 188.0* 08/13/2010   Lab Results  Component Value Date   CHOLHDL 5 08/13/2010     Assessment & Plan  Fatigue Long history of fatigue is present routinely, will check a testosterone and vitamin d level. Encouraged regular exercise and 8 hours of sleep  ABDOMINAL PAIN, EPIGASTRIC Improved with dietary changes may continue Librax prn, has used very infrequently with some results.   PARESTHESIA Now having some symptoms on right as well Has started following with Dr Mercer Pod of [facility] and they are performing an EMG tomorrow to further investigate. Has some paresthesias in LLE now as well. If no conclusive results are found we could try an empiric course of neurontin to see if he benefits. He has developed a strange relatively new symptom. Intermittently when he urinates he gets tingling in the left side of his face.   Hyperlipidemia Mild, needs to still avoid trans fats and we will recheck with annual visit.

## 2011-01-08 NOTE — Patient Instructions (Signed)

## 2011-01-08 NOTE — Assessment & Plan Note (Signed)
Improved with dietary changes may continue Librax prn, has used very infrequently with some results.

## 2011-01-08 NOTE — Assessment & Plan Note (Signed)
Mild, needs to still avoid trans fats and we will recheck with annual visit.

## 2011-01-08 NOTE — Assessment & Plan Note (Signed)
Long history of fatigue is present routinely, will check a testosterone and vitamin d level. Encouraged regular exercise and 8 hours of sleep

## 2011-01-08 NOTE — Assessment & Plan Note (Signed)
Now having some symptoms on right as well Has started following with Dr Mercer Pod of [facility] and they are performing an EMG tomorrow to further investigate. Has some paresthesias in LLE now as well. If no conclusive results are found we could try an empiric course of neurontin to see if he benefits. He has developed a strange relatively new symptom. Intermittently when he urinates he gets tingling in the left side of his face.

## 2011-01-10 ENCOUNTER — Ambulatory Visit: Payer: BC Managed Care – PPO

## 2011-01-10 ENCOUNTER — Telehealth: Payer: Self-pay

## 2011-01-10 DIAGNOSIS — E538 Deficiency of other specified B group vitamins: Secondary | ICD-10-CM

## 2011-01-10 MED ORDER — CYANOCOBALAMIN 1000 MCG/ML IJ SOLN
1000.0000 ug | Freq: Once | INTRAMUSCULAR | Status: AC
  Administered 2011-01-10: 1000 ug via INTRAMUSCULAR

## 2011-01-10 NOTE — Telephone Encounter (Signed)
Pts neurologist would like pt to take B12 injections pt was given an RX for injections. Pt is going to come by today with spouse to be shown how to do injections

## 2011-01-11 LAB — VITAMIN D 1,25 DIHYDROXY: Vitamin D 1, 25 (OH)2 Total: 36 pg/mL (ref 18–72)

## 2011-07-01 ENCOUNTER — Other Ambulatory Visit (INDEPENDENT_AMBULATORY_CARE_PROVIDER_SITE_OTHER): Payer: BC Managed Care – PPO

## 2011-07-01 DIAGNOSIS — R030 Elevated blood-pressure reading, without diagnosis of hypertension: Secondary | ICD-10-CM

## 2011-07-01 DIAGNOSIS — R209 Unspecified disturbances of skin sensation: Secondary | ICD-10-CM

## 2011-07-01 DIAGNOSIS — R5381 Other malaise: Secondary | ICD-10-CM

## 2011-07-01 LAB — RENAL FUNCTION PANEL
Albumin: 4.2 g/dL (ref 3.5–5.2)
BUN: 19 mg/dL (ref 6–23)
CO2: 27 mEq/L (ref 19–32)
Chloride: 109 mEq/L (ref 96–112)
GFR: 77.32 mL/min (ref >60.00)

## 2011-07-01 LAB — CBC
HCT: 41.9 % (ref 39.0–52.0)
RBC: 4.48 Mil/uL (ref 4.22–5.81)
WBC: 5.6 10*3/uL (ref 4.5–10.5)

## 2011-07-01 LAB — HEPATIC FUNCTION PANEL
ALT: 21 U/L (ref 0–53)
AST: 23 U/L (ref 0–37)
Albumin: 4.2 g/dL (ref 3.5–5.2)

## 2011-07-01 LAB — LIPID PANEL: Cholesterol: 167 mg/dL (ref 0–200)

## 2011-07-02 LAB — LDL CHOLESTEROL, DIRECT: Direct LDL: 88.3 mg/dL

## 2011-07-03 LAB — VITAMIN D 1,25 DIHYDROXY
Vitamin D 1, 25 (OH)2 Total: 39 pg/mL (ref 18–72)
Vitamin D3 1, 25 (OH)2: 22 pg/mL

## 2011-07-08 ENCOUNTER — Encounter: Payer: Self-pay | Admitting: Family Medicine

## 2011-07-08 ENCOUNTER — Ambulatory Visit (INDEPENDENT_AMBULATORY_CARE_PROVIDER_SITE_OTHER): Payer: BC Managed Care – PPO | Admitting: Family Medicine

## 2011-07-08 VITALS — BP 142/98 | HR 72 | Temp 98.0°F | Ht 68.0 in | Wt 212.0 lb

## 2011-07-08 DIAGNOSIS — E349 Endocrine disorder, unspecified: Secondary | ICD-10-CM

## 2011-07-08 DIAGNOSIS — R7989 Other specified abnormal findings of blood chemistry: Secondary | ICD-10-CM

## 2011-07-08 DIAGNOSIS — R52 Pain, unspecified: Secondary | ICD-10-CM

## 2011-07-08 DIAGNOSIS — M2550 Pain in unspecified joint: Secondary | ICD-10-CM

## 2011-07-08 DIAGNOSIS — E559 Vitamin D deficiency, unspecified: Secondary | ICD-10-CM | POA: Insufficient documentation

## 2011-07-08 DIAGNOSIS — E291 Testicular hypofunction: Secondary | ICD-10-CM

## 2011-07-08 MED ORDER — MELOXICAM 15 MG PO TABS: 15.0000 mg | ORAL_TABLET | Freq: Every day | ORAL | Status: DC | PRN

## 2011-07-08 MED ORDER — TESTOSTERONE CYPIONATE 200 MG/ML IM SOLN: 100.0000 mg | INTRAMUSCULAR | Status: DC

## 2011-07-08 NOTE — Progress Notes (Signed)
Patient ID: Casey Miles, male   DOB: 1966/02/13, 46 y.o.   MRN: [id number] Casey Miles [id number] [birth year] 07/08/2011      Progress Note-Follow Up  Subjective  Chief Complaint  Chief Complaint  Patient presents with  . Follow-up    6 month    HPI  Patient is a 46 year old male in today for follow up on multiple concerns. He continues to struggle with atypical chest discomfort and left arm pain at times. Has other random myalgias as well but these are the most consistent. He is following with [facility] and they have been great at forwarding [place] notes and keeping [place] abreast of their thinking. Unable to pinpoint the cause of his pain. They have recently given him a trial of Amantadine to possibly treat an unidentified previous illness. No response per patient. He had also been placed on vitamin D 50,000 international units once a week for 4 weeks and make out his levels to be low. He feels as if his myalgias that she got worse on vitamin D but we will repeat vitamin D levels today. He continues to work and stay very active but also continues to have daily pain. No shortness of breath, recent illness, fevers, congestion, GI or GU complaints otherwise noted today.  Past Medical History  Diagnosis Date  . Kidney stone   . GERD (gastroesophageal reflux disease)   . Chest wall pain   . SHOULDER PAIN, LEFT 07/17/2008  . PARESTHESIA 11/22/2009  . Other acute reactions to stress 09/26/2009  . NECK PAIN 09/26/2009  . 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 COSTOCHONDRITIS, RECURRENT 05/06/2010  . Chronic rhinitis 09/26/2009  . CHEST PAIN, ATYPICAL 05/04/2008  . Abdominal pain, epigastric 09/26/2009  . Arthralgia of multiple sites 08/13/2010  . Hyperlipidemia 10/08/2010  . Fatigue 01/08/2011  . Vitamin d deficiency 07/08/2011    History reviewed. No pertinent past surgical history.  Family History  Problem Relation Age of Onset  . Diabetes Mother   . Hypertension Mother   .  Hyperlipidemia Mother   . Diabetes Maternal Grandmother   . Heart disease Maternal Grandmother   . Colitis Maternal Grandmother   . Diabetes Paternal Grandmother   . Irritable bowel syndrome    . Cancer      Esophageal- [employer]  . Stomach cancer      Uncle  . Colon polyps      Grandmother  . Colitis Maternal Grandfather   . Heart disease Maternal Grandfather   . Heart attack Father 15  . Diabetes Other     Family history of  . Hypertension Other     Family history of  . Heart attack Other     Family history of  . Irritable bowel syndrome Other     Family history of    History   Social History  . Marital Status: Married    Spouse Name: N/A    Number of Children: N/A  . Years of Education: N/A   Occupational History  . Not on file.   Social History Main Topics  . Smoking status: Never Smoker   . Smokeless tobacco: Never Used  . Alcohol Use: 0.0 oz/week    1-2 drink(s) per week     1-2 per week  . Drug Use: No  . Sexually Active: Not on file   Other Topics Concern  . Not on file   Social History Narrative  . No narrative on file  Current Outpatient Prescriptions on File Prior to Visit  Medication Sig Dispense Refill  . Alum & Mag Hydroxide-Simeth (MAALOX PLUS) 225-200-25 MG/5ML SUSP Take 10 mLs by mouth as needed.        . fish oil-omega-3 fatty acids 1000 MG capsule Take 1,000 mg by mouth 2 (two) times daily.        Marland Kitchen gabapentin (NEURONTIN) 300 MG capsule Take 300 mg by mouth 2 (two) times daily.      . nitroGLYCERIN (NITROSTAT) 0.4 MG SL tablet Place 0.4 mg under the tongue every 5 (five) minutes as needed. After 3 doses if not resolution to ER       . testosterone cypionate (DEPO-TESTOSTERONE) 200 MG/ML injection Inject 0.5 mLs (100 mg total) into the muscle every 14 (fourteen) days.  10 mL  0    No Known Allergies  Review of Systems  Review of Systems  Constitutional: Positive for malaise/fatigue. Negative for fever.  HENT: Negative for  congestion.   Eyes: Negative for discharge.  Respiratory: Negative for shortness of breath.   Cardiovascular: Negative for chest pain, palpitations and leg swelling.  Gastrointestinal: Negative for nausea, abdominal pain and diarrhea.  Genitourinary: Negative for dysuria.  Musculoskeletal: Positive for myalgias. Negative for falls.  Skin: Negative for rash.  Neurological: Negative for loss of consciousness and headaches.  Endo/Heme/Allergies: Negative for polydipsia.  Psychiatric/Behavioral: Negative for depression and suicidal ideas. The patient is not nervous/anxious and does not have insomnia.     Objective  BP 142/98  Pulse 72  Temp(Src) 98 F (36.7 C) (Temporal)  Ht 5\' 8"  (1.727 m)  Wt 212 lb (96.163 kg)  BMI 32.23 kg/m2  SpO2 99%  Physical Exam  Physical Exam  Constitutional: He is oriented to person, place, and time and well-developed, well-nourished, and in no distress. No distress.  HENT:  Head: Normocephalic and atraumatic.  Eyes: Conjunctivae are normal.  Neck: Neck supple. No thyromegaly present.  Cardiovascular: Normal rate, regular rhythm and normal heart sounds.   No murmur heard. Pulmonary/Chest: Effort normal and breath sounds normal. No respiratory distress.  Abdominal: He exhibits no distension and no mass. There is no tenderness.  Musculoskeletal: He exhibits no edema.  Neurological: He is alert and oriented to person, place, and time.  Skin: Skin is warm.  Psychiatric: Memory, affect and judgment normal.    Lab Results  Component Value Date   TSH 0.49 07/01/2011   Lab Results  Component Value Date   WBC 5.6 07/01/2011   HGB 14.4 07/01/2011   HCT 41.9 07/01/2011   MCV 93.6 07/01/2011   PLT 190.0 07/01/2011   Lab Results  Component Value Date   CREATININE 1.1 07/01/2011   BUN 19 07/01/2011   NA 143 07/01/2011   K 4.3 07/01/2011   CL 109 07/01/2011   CO2 27 07/01/2011   Lab Results  Component Value Date   ALT 21 07/01/2011   AST 23 07/01/2011    ALKPHOS 51 07/01/2011   BILITOT 0.7 07/01/2011   Lab Results  Component Value Date   CHOL 167 07/01/2011   Lab Results  Component Value Date   HDL 36.80* 07/01/2011   Lab Results  Component Value Date   LDLCALC 114* 08/13/2010   Lab Results  Component Value Date   TRIG 376.0* 07/01/2011   Lab Results  Component Value Date   CHOLHDL 5 07/01/2011     Assessment & Plan  Arthralgia of multiple sites Has seen rheumatology and have not found any obvious  cause in this arena has been ruled out. May use Meloxicam prn  Vitamin d deficiency Repeat Vitamin D level before treating further  Testosterone deficiency Will start injections every 2 weeks. Recheck level in 3 months. Warned regarding possible side effects

## 2011-07-08 NOTE — Assessment & Plan Note (Addendum)
Has seen rheumatology and have not found any obvious cause in this arena has been ruled out. May use Meloxicam prn

## 2011-07-08 NOTE — Patient Instructions (Addendum)
Triglycerides, TG, TRIG This is a test to check your risk of developing heart disease. It is often done as part of a lipid profile during a regular medical exam or if you are being treated for high triglycerides. This test measures the amount of triglycerides in your blood. Triglycerides are the body's storage form for fat. Most triglycerides are found in fat tissue. Some triglycerides circulate in the blood to provide fuel for muscles to work. Extra triglycerides are found in the blood after eating a meal when fat is being sent from the gut to fat tissue for storage. The test for triglycerides should be done when you are fasting and no extra triglycerides from a recent meal are present.  SAMPLE COLLECTION The test for triglycerides uses a blood sample. Most often, the blood sample is collected using a needle to collect blood from a vein. Sometimes triglycerides are measured using a drop of blood collected by puncturing the skin on a finger. Testing should be done when you are fasting. For 12 to 14 hours before the test, only water is permitted. In addition, alcohol should not be consumed for the 24 hours just before the test. If you are diabetic and your blood sugar is out of control, triglycerides will be very high. NORMAL FINDINGS  Adult/elderly   Male: 40-160 mg/dL or 1.61-0.96 mmol/L (SI units)   Male: 35-135 mg/dL or 0.45-4.09 mmol/L (SI units)   0-44 years   Male: 30-86 mg/dL   Male: 81-19 mg/dL   1-44 years   Male: 82-956 mg/dL   Male: 21-308 mg/dL   65-44 years   Male: 46-962 mg/dL   Male: 95-284 mg/dL   13-44 years   Male: 40-102 mg/dL   Male: 72-536 mg/dL  Ranges for normal findings may vary among different laboratories and hospitals. You should always check with your doctor after having lab work or other tests done to discuss the meaning of your test results and whether your values are considered within normal limits. MEANING OF TEST  Your caregiver will go  over the test results with you and discuss the importance and meaning of your results, as well as treatment options and the need for additional tests if necessary. OBTAINING THE TEST RESULTS It is your responsibility to obtain your test results. Ask the lab or department performing the test when and how you will get your results. Document Released: 03/22/2004 Document Revised: 02/06/2011 Document Reviewed: 01/30/2008 [facility] Patient Information 2012 [place], [place]. Minimize simple carbs  Add Benefiber 2 tsp daily  Increase exercise.   Add a calcium, magnesium, zinc supplement Testosterone This test is used to determine if your testosterone level is abnormal. This could be used to explain difficulty getting an erection (erectile dysfunction), inability of your partner to get pregnant (infertility), premature or delayed puberty if you are male, or the appearance of masculine physical features if you are male. PREPARATION FOR TEST A blood sample is obtained by inserting a needle into a vein in the arm. NORMAL FINDINGS  Free Testosterone: 0.3-2 pg/mL   % Free Testosterone: 0.1%-0.3%  Total Testosterone:  7 mos-9 yrs (Tanner Stage I)   Male: Less than 30 ng/dL   Male: Less than 30 ng/dL   64-40 yrs (Tanner Stage II)   Male: Less than 300 ng/dL   Male: Less than 40 ng/dL   34-74 yrs (Tanner Stage III)   Male: 170-540 ng/dL   Male: Less than 60 ng/dL   25-95 yrs (Tanner Stage IV, V)  Male: 250-910 ng/dL   Male: Less than 70 ng/dL   20 yrs and over   Male: 301-604-4455 ng/dL   Male: Less than 70 ng/dL  Ranges for normal findings may vary among different laboratories and hospitals. You should always check with your doctor after having lab work or other tests done to discuss the meaning of your test results and whether your values are considered within normal limits. MEANING OF TEST  Your caregiver will go over the test results with you and discuss the  importance and meaning of your results, as well as treatment options and the need for additional tests if necessary. OBTAINING THE TEST RESULTS It is your responsibility to obtain your test results. Ask the lab or department performing the test when and how you will get your results. Document Released: 03/06/2004 Document Revised: 02/06/2011 Document Reviewed: 01/30/2008 [facility] Patient Information 2012 [place], [place].

## 2011-07-09 ENCOUNTER — Ambulatory Visit: Payer: BC Managed Care – PPO | Admitting: Family Medicine

## 2011-07-09 ENCOUNTER — Encounter: Payer: Self-pay | Admitting: Family Medicine

## 2011-07-09 DIAGNOSIS — E349 Endocrine disorder, unspecified: Secondary | ICD-10-CM

## 2011-07-09 HISTORY — DX: Endocrine disorder, unspecified: E34.9

## 2011-07-09 NOTE — Assessment & Plan Note (Signed)
Will start injections every 2 weeks. Recheck level in 3 months. Warned regarding possible side effects

## 2011-07-09 NOTE — Assessment & Plan Note (Signed)
Repeat Vitamin D level before treating further

## 2011-07-15 ENCOUNTER — Telehealth: Payer: Self-pay | Admitting: Family Medicine

## 2011-07-15 NOTE — Telephone Encounter (Signed)
Patient informed that we are waiting on the approval from the insurance. Patient is okay with this states he was just checking on this.

## 2011-07-15 NOTE — Telephone Encounter (Signed)
Patient called in to see what the prior approval status is of his testosterone Rx is

## 2011-07-17 ENCOUNTER — Ambulatory Visit (INDEPENDENT_AMBULATORY_CARE_PROVIDER_SITE_OTHER): Payer: BC Managed Care – PPO

## 2011-07-17 DIAGNOSIS — E349 Endocrine disorder, unspecified: Secondary | ICD-10-CM

## 2011-07-17 DIAGNOSIS — E291 Testicular hypofunction: Secondary | ICD-10-CM

## 2011-07-17 MED ORDER — TESTOSTERONE CYPIONATE 200 MG/ML IM SOLN
100.0000 mg | INTRAMUSCULAR | Status: DC
  Administered 2011-07-17: 100 mg via INTRAMUSCULAR

## 2011-07-17 NOTE — Telephone Encounter (Signed)
Patient has picked up Rx, closing phone note

## 2011-07-17 NOTE — Progress Notes (Signed)
  Subjective:    Patient ID: Casey Miles, male    DOB: 05-30-1965, 46 y.o.   MRN: [id number]  HPI    Review of Systems     Objective:   Physical Exam        Assessment & Plan:  Patient and spouse came in for first testosterone injection and for spouse to give shot. Patient tolerated testosterone shot.

## 2011-10-02 ENCOUNTER — Other Ambulatory Visit (INDEPENDENT_AMBULATORY_CARE_PROVIDER_SITE_OTHER): Payer: BC Managed Care – PPO

## 2011-10-02 DIAGNOSIS — E538 Deficiency of other specified B group vitamins: Secondary | ICD-10-CM

## 2011-10-02 DIAGNOSIS — E785 Hyperlipidemia, unspecified: Secondary | ICD-10-CM

## 2011-10-02 DIAGNOSIS — R5383 Other fatigue: Secondary | ICD-10-CM

## 2011-10-02 DIAGNOSIS — E291 Testicular hypofunction: Secondary | ICD-10-CM

## 2011-10-02 DIAGNOSIS — R5381 Other malaise: Secondary | ICD-10-CM

## 2011-10-02 LAB — CBC
MCHC: 34.2 g/dL (ref 30.0–36.0)
RDW: 12.7 % (ref 11.5–14.6)
WBC: 6.2 10*3/uL (ref 4.5–10.5)

## 2011-10-02 LAB — LIPID PANEL
HDL: 35.5 mg/dL — ABNORMAL LOW (ref >39.00)
Total CHOL/HDL Ratio: 4
Triglycerides: 391 mg/dL — ABNORMAL HIGH (ref 0.0–149.0)
VLDL: 78.2 mg/dL — ABNORMAL HIGH (ref 0.0–40.0)

## 2011-10-02 LAB — TESTOSTERONE: Testosterone: 391.25 ng/dL (ref 350.00–890.00)

## 2011-10-03 LAB — RENAL FUNCTION PANEL
BUN: 13 mg/dL (ref 6–23)
Creatinine, Ser: 1.2 mg/dL (ref 0.4–1.5)
GFR: 69.85 mL/min (ref >60.00)
Glucose, Bld: 87 mg/dL (ref 70–99)
Sodium: 139 mEq/L (ref 135–145)

## 2011-10-03 LAB — VITAMIN B12: Vitamin B-12: 388 pg/mL (ref 211–911)

## 2011-10-03 LAB — TSH: TSH: 1.01 u[IU]/mL (ref 0.35–5.50)

## 2011-10-03 LAB — T4, FREE: Free T4: 0.79 ng/dL (ref 0.60–1.60)

## 2011-10-06 LAB — INTRINSIC FACTOR ANTIBODIES: Intrinsic Factor: NEGATIVE

## 2011-10-08 ENCOUNTER — Encounter: Payer: Self-pay | Admitting: Family Medicine

## 2011-10-08 ENCOUNTER — Ambulatory Visit (INDEPENDENT_AMBULATORY_CARE_PROVIDER_SITE_OTHER): Payer: BC Managed Care – PPO | Admitting: Family Medicine

## 2011-10-08 VITALS — BP 132/92 | HR 77 | Temp 97.6°F | Ht 68.0 in | Wt 216.8 lb

## 2011-10-08 DIAGNOSIS — E349 Endocrine disorder, unspecified: Secondary | ICD-10-CM

## 2011-10-08 DIAGNOSIS — E291 Testicular hypofunction: Secondary | ICD-10-CM

## 2011-10-08 DIAGNOSIS — R202 Paresthesia of skin: Secondary | ICD-10-CM

## 2011-10-08 DIAGNOSIS — E559 Vitamin D deficiency, unspecified: Secondary | ICD-10-CM

## 2011-10-08 DIAGNOSIS — R209 Unspecified disturbances of skin sensation: Secondary | ICD-10-CM

## 2011-10-08 DIAGNOSIS — M542 Cervicalgia: Secondary | ICD-10-CM

## 2011-10-08 DIAGNOSIS — R5383 Other fatigue: Secondary | ICD-10-CM

## 2011-10-08 DIAGNOSIS — E785 Hyperlipidemia, unspecified: Secondary | ICD-10-CM

## 2011-10-08 DIAGNOSIS — R5381 Other malaise: Secondary | ICD-10-CM

## 2011-10-08 DIAGNOSIS — R030 Elevated blood-pressure reading, without diagnosis of hypertension: Secondary | ICD-10-CM

## 2011-10-08 DIAGNOSIS — IMO0001 Reserved for inherently not codable concepts without codable children: Secondary | ICD-10-CM | POA: Insufficient documentation

## 2011-10-08 HISTORY — DX: Reserved for inherently not codable concepts without codable children: IMO0001

## 2011-10-08 MED ORDER — VITAMIN B-12 1000 MCG SL SUBL: 1.0000 | SUBLINGUAL_TABLET | Freq: Every morning | SUBLINGUAL | Status: DC

## 2011-10-08 MED ORDER — ASPIRIN EC 81 MG PO TBEC: 81.0000 mg | DELAYED_RELEASE_TABLET | Freq: Every day | ORAL | Status: AC

## 2011-10-08 MED ORDER — NIACIN ER 500 MG PO CPCR: 500.0000 mg | ORAL_CAPSULE | Freq: Every day | ORAL | Status: DC

## 2011-10-08 NOTE — Assessment & Plan Note (Signed)
Recheck level with next blood draw

## 2011-10-08 NOTE — Assessment & Plan Note (Signed)
Improving with treatment.  ?

## 2011-10-08 NOTE — Progress Notes (Signed)
Patient ID: Casey Miles, male   DOB: [birth year], 46 y.o.   MRN: [id number] Casey Miles [id number] 10/14/1965 10/08/2011      Progress Note-Follow Up  Subjective  Chief Complaint  Chief Complaint  Patient presents with  . Follow-up    3 month    HPI  Patient is a 46 year old Caucasian male who is in today for followup of multiple medical problems. He is now seen Dr. Roderic Ovens at [facility] in [place]. His neurologist referred him they were unable to figure out the cause of his neck pain and chest pain. They did a flex MRI on July 5 which showed that he did have bulging discs with certain positions in the C5 region. They believe this is responsible for a lot of the paresthesias he feels in his fingertips as well as the atypical chest pain shoulder pain he sometimes struggles with. He reports when he sneezes he has a 3-5 second window of paresthesias and pins and needles in his fingertips bilaterally and he also notes a when he lies on his back he often gets that feeling of tingling in his fingertips. Otherwise she reports she's doing relatively well. He reports typical blood pressure at home is 116/80. He denies any significant headaches, recent illness, chest pain, palpitations, GI or GU complaints. He is noting he started having more intense reaction of both eyes without any obvious infection. He is encouraged to try witch hazel and steroid creams to see if that helps. He feels the testosterone dosing has helped his fatigue somewhat is willing to stay at this dose for the next  Past Medical History  Diagnosis Date  . Kidney stone   . GERD (gastroesophageal reflux disease)   . Chest wall pain   . SHOULDER PAIN, LEFT 07/17/2008  . PARESTHESIA 11/22/2009  . Other acute reactions to stress 09/26/2009  . NECK PAIN 09/26/2009  . 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 COSTOCHONDRITIS, RECURRENT 05/06/2010  . Chronic rhinitis 09/26/2009  . CHEST PAIN, ATYPICAL 05/04/2008  .  Abdominal pain, epigastric 09/26/2009  . Arthralgia of multiple sites 08/13/2010  . Hyperlipidemia 10/08/2010  . Fatigue 01/08/2011  . Vitamin d deficiency 07/08/2011  . Testosterone deficiency 07/09/2011  . Elevated BP 10/08/2011    History reviewed. No pertinent past surgical history.  Family History  Problem Relation Age of Onset  . Diabetes Mother   . Hypertension Mother   . Hyperlipidemia Mother   . Diabetes Maternal Grandmother   . Heart disease Maternal Grandmother   . Colitis Maternal Grandmother   . Diabetes Paternal Grandmother   . Irritable bowel syndrome    . Cancer      Esophageal- [employer]  . Stomach cancer      Uncle  . Colon polyps      Grandmother  . Colitis Maternal Grandfather   . Heart disease Maternal Grandfather   . Heart attack Father 29  . Diabetes Other     Family history of  . Hypertension Other     Family history of  . Heart attack Other     Family history of  . Irritable bowel syndrome Other     Family history of    History   Social History  . Marital Status: Married    Spouse Name: N/A    Number of Children: N/A  . Years of Education: N/A   Occupational History  . Not on file.   Social History Main Topics  . Smoking status:  Never Smoker   . Smokeless tobacco: Never Used  . Alcohol Use: 0.0 oz/week    1-2 drink(s) per week     1-2 per week  . Drug Use: No  . Sexually Active: Not on file   Other Topics Concern  . Not on file   Social History Narrative  . No narrative on file    Current Outpatient Prescriptions on File Prior to Visit  Medication Sig Dispense Refill  . fish oil-omega-3 fatty acids 1000 MG capsule Take 1,000 mg by mouth 2 (two) times daily.        Marland Kitchen testosterone cypionate (DEPO-TESTOSTERONE) 200 MG/ML injection Inject 0.5 mLs (100 mg total) into the muscle every 14 (fourteen) days.  10 mL  0  . cyclobenzaprine (FLEXERIL) 10 MG tablet Take 5 mg by mouth as needed.      . nitroGLYCERIN (NITROSTAT) 0.4 MG SL  tablet Place 0.4 mg under the tongue every 5 (five) minutes as needed. After 3 doses if not resolution to ER       . Transdermal Base CREA Apply topically. Ketamine 10%,  Baclofen 2%, Diclofenac 3%, Gabapentin 6%, Orphenadrine 5%, Bupivacaine 2%        No Known Allergies  Review of Systems  Review of Systems  Constitutional: Positive for malaise/fatigue. Negative for fever.  HENT: Positive for neck pain. Negative for congestion.   Eyes: Negative for discharge.  Respiratory: Negative for shortness of breath.   Cardiovascular: Positive for chest pain. Negative for palpitations and leg swelling.  Gastrointestinal: Negative for nausea, abdominal pain and diarrhea.  Genitourinary: Negative for dysuria.  Musculoskeletal: Negative for falls.  Skin: Negative for rash.  Neurological: Positive for tingling. Negative for loss of consciousness and headaches.       Finger tips intermittent with lying flat  Endo/Heme/Allergies: Negative for polydipsia.  Psychiatric/Behavioral: Negative for depression and suicidal ideas. The patient is not nervous/anxious and does not have insomnia.     Objective  BP 132/92  Pulse 77  Temp 97.6 F (36.4 C) (Temporal)  Ht 5\' 8"  (1.727 m)  Wt 216 lb 12.8 oz (98.34 kg)  BMI 32.96 kg/m2  SpO2 98%  Physical Exam  Physical Exam  Constitutional: He is oriented to person, place, and time and well-developed, well-nourished, and in no distress. No distress.  HENT:  Head: Normocephalic and atraumatic.  Eyes: Conjunctivae are normal.  Neck: Neck supple. No thyromegaly present.  Cardiovascular: Normal rate, regular rhythm and normal heart sounds.   No murmur heard. Pulmonary/Chest: Effort normal and breath sounds normal. No respiratory distress.  Abdominal: He exhibits no distension and no mass. There is no tenderness.  Musculoskeletal: He exhibits no edema.  Neurological: He is alert and oriented to person, place, and time.  Skin: Skin is warm.  Psychiatric:  Memory, affect and judgment normal.    Lab Results  Component Value Date   TSH 1.01 10/02/2011   Lab Results  Component Value Date   WBC 6.2 10/02/2011   HGB 14.4 10/02/2011   HCT 42.3 10/02/2011   MCV 94.0 10/02/2011   PLT 189.0 10/02/2011   Lab Results  Component Value Date   CREATININE 1.2 10/02/2011   BUN 13 10/02/2011   NA 139 10/02/2011   K 3.9 10/02/2011   CL 103 10/02/2011   CO2 29 10/02/2011   Lab Results  Component Value Date   ALT 21 07/01/2011   AST 23 07/01/2011   ALKPHOS 51 07/01/2011   BILITOT 0.7 07/01/2011   Lab  Results  Component Value Date   CHOL 159 10/02/2011   Lab Results  Component Value Date   HDL 35.50* 10/02/2011   Lab Results  Component Value Date   LDLCALC 114* 08/13/2010   Lab Results  Component Value Date   TRIG 391.0* 10/02/2011   Lab Results  Component Value Date   CHOLHDL 4 10/02/2011     Assessment & Plan  Elevated BP Improved on recheck and patient is reporting good numbers such as 116/80 at home. Will continue to hav ehim monitor it at home and let us know if it trends up  Testosterone deficiency Levels normal, but low normal will not change dosing for now and will continue to monitor with labs  Fatigue Improving with treatment  Hyperlipidemia Triglycerides continue to trend up. He is encouraged to avoid simple carbs, transected saturated fats. His encouraged increased exercise. We'll start Omega red 1 cap daily and is started on niacin extended release 500 mg tabs at bedtime. He will take a lowfat snack and an enteric-coated aspirin 81 mg half hour before that and report any concerns. Recheck FLP with labs in 3-4 months.  Vitamin d deficiency Recheck level with next blood draw  PARESTHESIA Was started on vitamin B12 injections by his neurologist has not noted any great difference. We did check intrinsic factor was negative. We will switch to vitamin B12 thousand micrograms sublingual daily and recheck his level again at his next blood  draw.  NECK PAIN Flex MRI performed by [facility] in [place] confirm bulging discs in C5. He is having some pain and symptoms appear seizures in bilateral hands intermittently as well as persistent neck pain and upper left chest wall pain. He is going to go for a series of steroid injections and then discuss his options further including neurosurgery.

## 2011-10-08 NOTE — Assessment & Plan Note (Signed)
Improved on recheck and patient is reporting good numbers such as 116/80 at home. Will continue to hav ehim monitor it at home and let us know if it trends up

## 2011-10-08 NOTE — Assessment & Plan Note (Signed)
Was started on vitamin B12 injections by his neurologist has not noted any great difference. We did check intrinsic factor was negative. We will switch to vitamin B12 thousand micrograms sublingual daily and recheck his level again at his next blood draw.

## 2011-10-08 NOTE — Assessment & Plan Note (Signed)
Triglycerides continue to trend up. He is encouraged to avoid simple carbs, transected saturated fats. His encouraged increased exercise. We'll start Omega red 1 cap daily and is started on niacin extended release 500 mg tabs at bedtime. He will take a lowfat snack and an enteric-coated aspirin 81 mg half hour before that and report any concerns. Recheck FLP with labs in 3-4 months.

## 2011-10-08 NOTE — Assessment & Plan Note (Signed)
Levels normal, but low normal will not change dosing for now and will continue to monitor with labs

## 2011-10-08 NOTE — Assessment & Plan Note (Signed)
Flex MRI performed by [facility] in [place] confirm bulging discs in C5. He is having some pain and symptoms appear seizures in bilateral hands intermittently as well as persistent neck pain and upper left chest wall pain. He is going to go for a series of steroid injections and then discuss his options further including neurosurgery.

## 2011-10-08 NOTE — Patient Instructions (Addendum)

## 2011-11-12 ENCOUNTER — Encounter: Payer: Self-pay | Admitting: Family Medicine

## 2011-11-12 NOTE — Telephone Encounter (Signed)
Please advise mychart question? 

## 2012-01-15 ENCOUNTER — Other Ambulatory Visit (INDEPENDENT_AMBULATORY_CARE_PROVIDER_SITE_OTHER): Payer: BC Managed Care – PPO

## 2012-01-15 DIAGNOSIS — R5381 Other malaise: Secondary | ICD-10-CM

## 2012-01-15 DIAGNOSIS — E291 Testicular hypofunction: Secondary | ICD-10-CM

## 2012-01-15 DIAGNOSIS — R030 Elevated blood-pressure reading, without diagnosis of hypertension: Secondary | ICD-10-CM

## 2012-01-15 DIAGNOSIS — E785 Hyperlipidemia, unspecified: Secondary | ICD-10-CM

## 2012-01-15 DIAGNOSIS — R5383 Other fatigue: Secondary | ICD-10-CM

## 2012-01-15 LAB — HEPATIC FUNCTION PANEL
Alkaline Phosphatase: 39 U/L (ref 39–117)
Bilirubin, Direct: 0 mg/dL (ref 0.0–0.3)
Total Bilirubin: 1.2 mg/dL (ref 0.3–1.2)
Total Protein: 7.7 g/dL (ref 6.0–8.3)

## 2012-01-15 LAB — LIPID PANEL
HDL: 41.5 mg/dL (ref >39.00)
Triglycerides: 189 mg/dL — ABNORMAL HIGH (ref 0.0–149.0)
VLDL: 37.8 mg/dL (ref 0.0–40.0)

## 2012-01-15 LAB — CBC
HCT: 45.4 % (ref 39.0–52.0)
RBC: 4.77 Mil/uL (ref 4.22–5.81)
WBC: 9.8 10*3/uL (ref 4.5–10.5)

## 2012-01-15 LAB — RENAL FUNCTION PANEL
Calcium: 9.2 mg/dL (ref 8.4–10.5)
Creatinine, Ser: 1.3 mg/dL (ref 0.4–1.5)
Glucose, Bld: 87 mg/dL (ref 70–99)
Phosphorus: 3.7 mg/dL (ref 2.3–4.6)
Potassium: 4.4 mEq/L (ref 3.5–5.1)
Sodium: 138 mEq/L (ref 135–145)

## 2012-01-15 LAB — TESTOSTERONE: Testosterone: 210.29 ng/dL — ABNORMAL LOW (ref 350.00–890.00)

## 2012-01-15 LAB — VITAMIN B12: Vitamin B-12: 766 pg/mL (ref 211–911)

## 2012-01-21 ENCOUNTER — Ambulatory Visit (INDEPENDENT_AMBULATORY_CARE_PROVIDER_SITE_OTHER): Payer: BC Managed Care – PPO | Admitting: Family Medicine

## 2012-01-21 ENCOUNTER — Encounter: Payer: Self-pay | Admitting: Family Medicine

## 2012-01-21 VITALS — BP 142/88 | HR 68 | Temp 98.0°F | Ht 68.0 in | Wt 211.1 lb

## 2012-01-21 DIAGNOSIS — M940 Chondrocostal junction syndrome [Tietze]: Secondary | ICD-10-CM

## 2012-01-21 DIAGNOSIS — E785 Hyperlipidemia, unspecified: Secondary | ICD-10-CM

## 2012-01-21 DIAGNOSIS — E349 Endocrine disorder, unspecified: Secondary | ICD-10-CM

## 2012-01-21 DIAGNOSIS — E559 Vitamin D deficiency, unspecified: Secondary | ICD-10-CM

## 2012-01-21 DIAGNOSIS — R030 Elevated blood-pressure reading, without diagnosis of hypertension: Secondary | ICD-10-CM

## 2012-01-21 DIAGNOSIS — E291 Testicular hypofunction: Secondary | ICD-10-CM

## 2012-01-21 MED ORDER — NIACIN ER 500 MG PO CPCR: 1000.0000 mg | ORAL_CAPSULE | Freq: Every day | ORAL | Status: DC

## 2012-01-21 NOTE — Patient Instructions (Addendum)

## 2012-01-21 NOTE — Assessment & Plan Note (Signed)
Improved on repeat check, encouraged DASH diet daily

## 2012-01-21 NOTE — Assessment & Plan Note (Signed)
wnl on last check will recheck with annual labs

## 2012-01-21 NOTE — Assessment & Plan Note (Signed)
Doing somewhat better until he had a recent epidural injection which restarted his chest spasm. Is better now again, using Meloxicam prn.

## 2012-01-21 NOTE — Assessment & Plan Note (Signed)
Had his labs checked the day before his shot. Will not change dosing for now. Recheck in 3 months

## 2012-01-21 NOTE — Progress Notes (Signed)
Patient ID: Casey Miles, male   DOB: 1965-06-13, 46 y.o.   MRN: [id number] Casey Miles [id number] Apr 24, 1965 01/21/2012      Progress Note-Follow Up  Subjective  Chief Complaint  Chief Complaint  Patient presents with  . Follow-up    3 month    HPI  Patient is a 46 year old Caucasian male who is in today for a followup visit. Overall he is doing well. Back and chest pain had been improved somewhat but after recent epidural injection it flared up again. He has had some spasm in the shoulder and neck as a result. It is somewhat better now. No other new complaints. He's recently started Nopal as a supplement for inflammation. No other recent illness, fevers, chills, chest pain, palpitations, shortness of breath, GI or GU complaints noted today. Took his flu shot back in October.  Past Medical History  Diagnosis Date  . Kidney stone   . GERD (gastroesophageal reflux disease)   . Chest wall pain   . SHOULDER PAIN, LEFT 07/17/2008  . PARESTHESIA 11/22/2009  . Other acute reactions to stress 09/26/2009  . NECK PAIN 09/26/2009  . 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 COSTOCHONDRITIS, RECURRENT 05/06/2010  . Chronic rhinitis 09/26/2009  . CHEST PAIN, ATYPICAL 05/04/2008  . Abdominal pain, epigastric 09/26/2009  . Arthralgia of multiple sites 08/13/2010  . Hyperlipidemia 10/08/2010  . Fatigue 01/08/2011  . Vitamin D deficiency 07/08/2011  . Testosterone deficiency 07/09/2011  . Elevated BP 10/08/2011    History reviewed. No pertinent past surgical history.  Family History  Problem Relation Age of Onset  . Diabetes Mother   . Hypertension Mother   . Hyperlipidemia Mother   . Diabetes Maternal Grandmother   . Heart disease Maternal Grandmother   . Colitis Maternal Grandmother   . Diabetes Paternal Grandmother   . Irritable bowel syndrome    . Cancer      Esophageal- [employer]  . Stomach cancer      Uncle  . Colon polyps      Grandmother  . Colitis Maternal  Grandfather   . Heart disease Maternal Grandfather   . Heart attack Father 39  . Diabetes Other     Family history of  . Hypertension Other     Family history of  . Heart attack Other     Family history of  . Irritable bowel syndrome Other     Family history of    History   Social History  . Marital Status: Married    Spouse Name: N/A    Number of Children: N/A  . Years of Education: N/A   Occupational History  . Not on file.   Social History Main Topics  . Smoking status: Never Smoker   . Smokeless tobacco: Never Used  . Alcohol Use: 0.0 oz/week    1-2 drink(s) per week     Comment: 1-2 per week  . Drug Use: No  . Sexually Active: Not on file   Other Topics Concern  . Not on file   Social History Narrative  . No narrative on file    Current Outpatient Prescriptions on File Prior to Visit  Medication Sig Dispense Refill  . aspirin EC 81 MG tablet Take 1 tablet (81 mg total) by mouth at bedtime.      Marland Kitchen CALCIUM-MAGNESIUM-ZINC PO Take 3 tablets by mouth daily.      . Cyanocobalamin (VITAMIN B-12) 1000 MCG SUBL Place 1 tablet (1,000 mcg total) under  the tongue every morning.      . fish oil-omega-3 fatty acids 1000 MG capsule Take 1,000 mg by mouth 2 (two) times daily.        Marland Kitchen testosterone cypionate (DEPO-TESTOSTERONE) 200 MG/ML injection Inject 0.5 mLs (100 mg total) into the muscle every 14 (fourteen) days.  10 mL  0  . Transdermal Base CREA Apply topically. Ketamine 10%,  Baclofen 2%, Diclofenac 3%, Gabapentin 6%, Orphenadrine 5%, Bupivacaine 2%        No Known Allergies  Review of Systems  Review of Systems  Constitutional: Positive for malaise/fatigue. Negative for fever.  HENT: Positive for neck pain. Negative for congestion.   Eyes: Negative for discharge.  Respiratory: Negative for shortness of breath.   Cardiovascular: Positive for chest pain. Negative for palpitations and leg swelling.  Gastrointestinal: Negative for nausea, abdominal pain and  diarrhea.  Genitourinary: Negative for dysuria.  Musculoskeletal: Positive for back pain. Negative for falls.  Skin: Negative for rash.  Neurological: Negative for loss of consciousness and headaches.  Endo/Heme/Allergies: Negative for polydipsia.  Psychiatric/Behavioral: Negative for depression and suicidal ideas. The patient is not nervous/anxious and does not have insomnia.     Objective  BP 142/88  Pulse 68  Temp 98 F (36.7 C) (Temporal)  Ht 5\' 8"  (1.727 m)  Wt 211 lb 1.9 oz (95.763 kg)  BMI 32.10 kg/m2  SpO2 99%  Physical Exam  Physical Exam  Constitutional: He is oriented to person, place, and time and well-developed, well-nourished, and in no distress. No distress.  HENT:  Head: Normocephalic and atraumatic.  Eyes: Conjunctivae normal are normal.  Neck: Neck supple. No thyromegaly present.  Cardiovascular: Normal rate, regular rhythm and normal heart sounds.   No murmur heard. Pulmonary/Chest: Effort normal and breath sounds normal. No respiratory distress.  Abdominal: He exhibits no distension and no mass. There is no tenderness.  Musculoskeletal: He exhibits no edema.  Neurological: He is alert and oriented to person, place, and time.  Skin: Skin is warm.  Psychiatric: Memory, affect and judgment normal.    Lab Results  Component Value Date   TSH 0.42 01/15/2012   Lab Results  Component Value Date   WBC 9.8 01/15/2012   HGB 15.3 01/15/2012   HCT 45.4 01/15/2012   MCV 95.1 01/15/2012   PLT 212.0 01/15/2012   Lab Results  Component Value Date   CREATININE 1.3 01/15/2012   BUN 23 01/15/2012   NA 138 01/15/2012   K 4.4 01/15/2012   CL 101 01/15/2012   CO2 29 01/15/2012   Lab Results  Component Value Date   ALT 21 01/15/2012   AST 21 01/15/2012   ALKPHOS 39 01/15/2012   BILITOT 1.2 01/15/2012   Lab Results  Component Value Date   CHOL 225* 01/15/2012   Lab Results  Component Value Date   HDL 41.50 01/15/2012   Lab Results  Component  Value Date   LDLCALC 114* 08/13/2010   Lab Results  Component Value Date   TRIG 189.0* 01/15/2012   Lab Results  Component Value Date   CHOLHDL 5 01/15/2012     Assessment & Plan  COSTOCHONDRITIS, RECURRENT Doing somewhat better until he had a recent epidural injection which restarted his chest spasm. Is better now again, using Meloxicam prn.   Elevated BP Improved on repeat check, encouraged DASH diet daily  Hyperlipidemia Encouraged increased Niacin ER 1000mg  daily. And avoid trans fats, increase exercise and continue MegaRed daily. Recheck in 3 months.  Testosterone deficiency  Had his labs checked the day before his shot. Will not change dosing for now. Recheck in 3 months  Vitamin D deficiency wnl on last check will recheck with annual labs   2

## 2012-01-21 NOTE — Assessment & Plan Note (Signed)
Encouraged increased Niacin ER 1000mg  daily. And avoid trans fats, increase exercise and continue MegaRed daily. Recheck in 3 months.

## 2012-01-23 ENCOUNTER — Ambulatory Visit: Payer: BC Managed Care – PPO | Admitting: Family Medicine

## 2012-01-26 ENCOUNTER — Other Ambulatory Visit: Payer: Self-pay

## 2012-01-26 DIAGNOSIS — R7989 Other specified abnormal findings of blood chemistry: Secondary | ICD-10-CM

## 2012-01-26 MED ORDER — TESTOSTERONE CYPIONATE 200 MG/ML IM SOLN: 100.0000 mg | INTRAMUSCULAR | Status: DC

## 2012-01-26 NOTE — Telephone Encounter (Signed)
RX faxed

## 2012-01-26 NOTE — Telephone Encounter (Signed)
Please advise refill? Last RX wrote on 07-08-11 quantity 10 ml with 0 refills.  If ok fax to [phone]

## 2012-02-19 ENCOUNTER — Other Ambulatory Visit: Payer: Self-pay | Admitting: Family Medicine

## 2012-02-19 DIAGNOSIS — R7989 Other specified abnormal findings of blood chemistry: Secondary | ICD-10-CM

## 2012-02-20 ENCOUNTER — Telehealth: Payer: Self-pay

## 2012-02-20 MED ORDER — TESTOSTERONE CYPIONATE 200 MG/ML IM SOLN: 100.0000 mg | INTRAMUSCULAR | Status: DC

## 2012-02-20 NOTE — Telephone Encounter (Signed)
Patient informed that he will need to come by and pick up Testosterone RX. The fax or phone calls won't go through

## 2012-04-19 ENCOUNTER — Other Ambulatory Visit (INDEPENDENT_AMBULATORY_CARE_PROVIDER_SITE_OTHER): Payer: BC Managed Care – PPO

## 2012-04-19 DIAGNOSIS — E291 Testicular hypofunction: Secondary | ICD-10-CM

## 2012-04-19 DIAGNOSIS — E785 Hyperlipidemia, unspecified: Secondary | ICD-10-CM

## 2012-04-19 DIAGNOSIS — E538 Deficiency of other specified B group vitamins: Secondary | ICD-10-CM

## 2012-04-19 DIAGNOSIS — R030 Elevated blood-pressure reading, without diagnosis of hypertension: Secondary | ICD-10-CM

## 2012-04-19 LAB — LIPID PANEL
Cholesterol: 195 mg/dL (ref 0–200)
HDL: 28.4 mg/dL — ABNORMAL LOW (ref >39.00)
Total CHOL/HDL Ratio: 7
Triglycerides: 737 mg/dL — ABNORMAL HIGH (ref 0.0–149.0)
VLDL: 147.4 mg/dL — ABNORMAL HIGH (ref 0.0–40.0)

## 2012-04-19 LAB — RENAL FUNCTION PANEL
Albumin: 4.1 g/dL (ref 3.5–5.2)
BUN: 18 mg/dL (ref 6–23)
Calcium: 8.9 mg/dL (ref 8.4–10.5)
Chloride: 104 mEq/L (ref 96–112)
Phosphorus: 2.4 mg/dL (ref 2.3–4.6)
Potassium: 4.2 mEq/L (ref 3.5–5.1)

## 2012-04-19 LAB — LDL CHOLESTEROL, DIRECT: Direct LDL: 56.1 mg/dL

## 2012-04-19 LAB — VITAMIN B12: Vitamin B-12: 756 pg/mL (ref 211–911)

## 2012-04-19 LAB — HEPATIC FUNCTION PANEL: Total Bilirubin: 0.9 mg/dL (ref 0.3–1.2)

## 2012-04-19 LAB — CBC
MCHC: 34 g/dL (ref 30.0–36.0)
MCV: 93.8 fl (ref 78.0–100.0)

## 2012-04-19 NOTE — Progress Notes (Signed)
Labs only

## 2012-04-21 ENCOUNTER — Ambulatory Visit: Payer: BC Managed Care – PPO | Admitting: Family Medicine

## 2012-04-23 ENCOUNTER — Ambulatory Visit (INDEPENDENT_AMBULATORY_CARE_PROVIDER_SITE_OTHER): Payer: BC Managed Care – PPO | Admitting: Family Medicine

## 2012-04-23 ENCOUNTER — Encounter: Payer: Self-pay | Admitting: Family Medicine

## 2012-04-23 VITALS — BP 136/94 | HR 77 | Temp 97.4°F | Ht 69.0 in | Wt 218.0 lb

## 2012-04-23 DIAGNOSIS — M549 Dorsalgia, unspecified: Secondary | ICD-10-CM

## 2012-04-23 DIAGNOSIS — R030 Elevated blood-pressure reading, without diagnosis of hypertension: Secondary | ICD-10-CM

## 2012-04-23 DIAGNOSIS — M5030 Other cervical disc degeneration, unspecified cervical region: Secondary | ICD-10-CM

## 2012-04-23 DIAGNOSIS — E349 Endocrine disorder, unspecified: Secondary | ICD-10-CM

## 2012-04-23 DIAGNOSIS — E785 Hyperlipidemia, unspecified: Secondary | ICD-10-CM

## 2012-04-23 MED ORDER — KRILL OIL PO CAPS: ORAL_CAPSULE | ORAL | Status: DC

## 2012-04-23 MED ORDER — ALPRAZOLAM 0.25 MG PO TABS: 0.2500 mg | ORAL_TABLET | Freq: Two times a day (BID) | ORAL | Status: DC | PRN

## 2012-04-23 MED ORDER — NAPROXEN SODIUM 220 MG PO TABS: 220.0000 mg | ORAL_TABLET | Freq: Two times a day (BID) | ORAL | Status: DC

## 2012-04-23 MED ORDER — METHOCARBAMOL 500 MG PO TABS: 500.0000 mg | ORAL_TABLET | Freq: Three times a day (TID) | ORAL | Status: DC | PRN

## 2012-04-23 NOTE — Patient Instructions (Addendum)
Hypertriglyceridemia  Diet for High blood levels of Triglycerides Most fats in food are triglycerides. Triglycerides in your blood are stored as fat in your body. High levels of triglycerides in your blood may put you at a greater risk for heart disease and stroke.  Normal triglyceride levels are less than 150 mg/dL. Borderline high levels are 150-199 mg/dl. High levels are 200 - 499 mg/dL, and very high triglyceride levels are greater than 500 mg/dL. The decision to treat high triglycerides is generally based on the level. For people with borderline or high triglyceride levels, treatment includes weight loss and exercise. Drugs are recommended for people with very high triglyceride levels. Many people who need treatment for high triglyceride levels have metabolic syndrome. This syndrome is a collection of disorders that often include: insulin resistance, high blood pressure, blood clotting problems, high cholesterol and triglycerides. TESTING PROCEDURE FOR TRIGLYCERIDES  You should not eat 4 hours before getting your triglycerides measured. The normal range of triglycerides is between 10 and 250 milligrams per deciliter (mg/dl). Some people may have extreme levels (1000 or above), but your triglyceride level may be too high if it is above 150 mg/dl, depending on what other risk factors you have for heart disease.  People with high blood triglycerides may also have high blood cholesterol levels. If you have high blood cholesterol as well as high blood triglycerides, your risk for heart disease is probably greater than if you only had high triglycerides. High blood cholesterol is one of the main risk factors for heart disease. CHANGING YOUR DIET  Your weight can affect your blood triglyceride level. If you are more than 20% above your ideal body weight, you may be able to lower your blood triglycerides by losing weight. Eating less and exercising regularly is the best way to combat this. Fat provides more  calories than any other food. The best way to lose weight is to eat less fat. Only 30% of your total calories should come from fat. Less than 7% of your diet should come from saturated fat. A diet low in fat and saturated fat is the same as a diet to decrease blood cholesterol. By eating a diet lower in fat, you may lose weight, lower your blood cholesterol, and lower your blood triglyceride level.  Eating a diet low in fat, especially saturated fat, may also help you lower your blood triglyceride level. Ask your dietitian to help you figure how much fat you can eat based on the number of calories your caregiver has prescribed for you.  Exercise, in addition to helping with weight loss may also help lower triglyceride levels.   Alcohol can increase blood triglycerides. You may need to stop drinking alcoholic beverages.  Too much carbohydrate in your diet may also increase your blood triglycerides. Some complex carbohydrates are necessary in your diet. These may include bread, rice, potatoes, other starchy vegetables and cereals.  Reduce "simple" carbohydrates. These may include pure sugars, candy, honey, and jelly without losing other nutrients. If you have the kind of high blood triglycerides that is affected by the amount of carbohydrates in your diet, you will need to eat less sugar and less high-sugar foods. Your caregiver can help you with this.  Adding 2-4 grams of fish oil (EPA+ DHA) may also help lower triglycerides. Speak with your caregiver before adding any supplements to your regimen. Following the Diet  Maintain your ideal weight. Your caregivers can help you with a diet. Generally, eating less food and getting more   exercise will help you lose weight. Joining a weight control group may also help. Ask your caregivers for a good weight control group in your area.  Eat low-fat foods instead of high-fat foods. This can help you lose weight too.  These foods are lower in fat. Eat MORE of these:    Dried beans, peas, and lentils.  Egg whites.  Low-fat cottage cheese.  Fish.  Lean cuts of meat, such as round, sirloin, rump, and flank (cut extra fat off meat you fix).  Whole grain breads, cereals and pasta.  Skim and nonfat dry milk.  Low-fat yogurt.  Poultry without the skin.  Cheese made with skim or part-skim milk, such as mozzarella, parmesan, farmers', ricotta, or pot cheese. These are higher fat foods. Eat LESS of these:   Whole milk and foods made from whole milk, such as American, blue, cheddar, monterey jack, and swiss cheese  High-fat meats, such as luncheon meats, sausages, knockwurst, bratwurst, hot dogs, ribs, corned beef, ground pork, and regular ground beef.  Fried foods. Limit saturated fats in your diet. Substituting unsaturated fat for saturated fat may decrease your blood triglyceride level. You will need to read package labels to know which products contain saturated fats.  These foods are high in saturated fat. Eat LESS of these:   Fried pork skins.  Whole milk.  Skin and fat from poultry.  Palm oil.  Butter.  Shortening.  Cream cheese.  Bacon.  Margarines and baked goods made from listed oils.  Vegetable shortenings.  Chitterlings.  Fat from meats.  Coconut oil.  Palm kernel oil.  Lard.  Cream.  Sour cream.  Fatback.  Coffee whiteners and non-dairy creamers made with these oils.  Cheese made from whole milk. Use unsaturated fats (both polyunsaturated and monounsaturated) moderately. Remember, even though unsaturated fats are better than saturated fats; you still want a diet low in total fat.  These foods are high in unsaturated fat:   Canola oil.  Sunflower oil.  Mayonnaise.  Almonds.  Peanuts.  Pine nuts.  Margarines made with these oils.  Safflower oil.  Olive oil.  Avocados.  Cashews.  Peanut butter.  Sunflower seeds.  Soybean oil.  Peanut  oil.  Olives.  Pecans.  Walnuts.  Pumpkin seeds. Avoid sugar and other high-sugar foods. This will decrease carbohydrates without decreasing other nutrients. Sugar in your food goes rapidly to your blood. When there is excess sugar in your blood, your liver may use it to make more triglycerides. Sugar also contains calories without other important nutrients.  Eat LESS of these:   Sugar, brown sugar, powdered sugar, jam, jelly, preserves, honey, syrup, molasses, pies, candy, cakes, cookies, frosting, pastries, colas, soft drinks, punches, fruit drinks, and regular gelatin.  Avoid alcohol. Alcohol, even more than sugar, may increase blood triglycerides. In addition, alcohol is high in calories and low in nutrients. Ask for sparkling water, or a diet soft drink instead of an alcoholic beverage. Suggestions for planning and preparing meals   Bake, broil, grill or roast meats instead of frying.  Remove fat from meats and skin from poultry before cooking.  Add spices, herbs, lemon juice or vinegar to vegetables instead of salt, rich sauces or gravies.  Use a non-stick skillet without fat or use no-stick sprays.  Cool and refrigerate stews and broth. Then remove the hardened fat floating on the surface before serving.  Refrigerate meat drippings and skim off fat to make low-fat gravies.  Serve more fish.  Use less butter,   margarine and other high-fat spreads on bread or vegetables.  Use skim or reconstituted non-fat dry milk for cooking.  Cook with low-fat cheeses.  Substitute low-fat yogurt or cottage cheese for all or part of the sour cream in recipes for sauces, dips or congealed salads.  Use half yogurt/half mayonnaise in salad recipes.  Substitute evaporated skim milk for cream. Evaporated skim milk or reconstituted non-fat dry milk can be whipped and substituted for whipped cream in certain recipes.  Choose fresh fruits for dessert instead of high-fat foods such as pies or  cakes. Fruits are naturally low in fat. When Dining Out   Order low-fat appetizers such as fruit or vegetable juice, pasta with vegetables or tomato sauce.  Select clear, rather than cream soups.  Ask that dressings and gravies be served on the side. Then use less of them.  Order foods that are baked, broiled, poached, steamed, stir-fried, or roasted.  Ask for margarine instead of butter, and use only a small amount.  Drink sparkling water, unsweetened tea or coffee, or diet soft drinks instead of alcohol or other sweet beverages. QUESTIONS AND ANSWERS ABOUT OTHER FATS IN THE BLOOD: SATURATED FAT, TRANS FAT, AND CHOLESTEROL What is trans fat? Trans fat is a type of fat that is formed when vegetable oil is hardened through a process called hydrogenation. This process helps makes foods more solid, gives them shape, and prolongs their shelf life. Trans fats are also called hydrogenated or partially hydrogenated oils.  What do saturated fat, trans fat, and cholesterol in foods have to do with heart disease? Saturated fat, trans fat, and cholesterol in the diet all raise the level of LDL "bad" cholesterol in the blood. The higher the LDL cholesterol, the greater the risk for coronary heart disease (CHD). Saturated fat and trans fat raise LDL similarly.  What foods contain saturated fat, trans fat, and cholesterol? High amounts of saturated fat are found in animal products, such as fatty cuts of meat, chicken skin, and full-fat dairy products like butter, whole milk, cream, and cheese, and in tropical vegetable oils such as palm, palm kernel, and coconut oil. Trans fat is found in some of the same foods as saturated fat, such as vegetable shortening, some margarines (especially hard or stick margarine), crackers, cookies, baked goods, fried foods, salad dressings, and other processed foods made with partially hydrogenated vegetable oils. Small amounts of trans fat also occur naturally in some animal  products, such as milk products, beef, and lamb. Foods high in cholesterol include liver, other organ meats, egg yolks, shrimp, and full-fat dairy products. How can I use the new food label to make heart-healthy food choices? Check the Nutrition Facts panel of the food label. Choose foods lower in saturated fat, trans fat, and cholesterol. For saturated fat and cholesterol, you can also use the Percent Daily Value (%DV): 5% DV or less is low, and 20% DV or more is high. (There is no %DV for trans fat.) Use the Nutrition Facts panel to choose foods low in saturated fat and cholesterol, and if the trans fat is not listed, read the ingredients and limit products that list shortening or hydrogenated or partially hydrogenated vegetable oil, which tend to be high in trans fat. POINTS TO REMEMBER:   Discuss your risk for heart disease with your caregivers, and take steps to reduce risk factors.  Change your diet. Choose foods that are low in saturated fat, trans fat, and cholesterol.  Add exercise to your daily routine if   it is not already being done. Participate in physical activity of moderate intensity, like brisk walking, for at least 30 minutes on most, and preferably all days of the week. No time? Break the 30 minutes into three, 10-minute segments during the day.  Stop smoking. If you do smoke, contact your caregiver to discuss ways in which they can help you quit.  Do not use street drugs.  Maintain a normal weight.  Maintain a healthy blood pressure.  Keep up with your blood work for checking the fats in your blood as directed by your caregiver. Document Released: 12/06/2003 Document Revised: 08/19/2011 Document Reviewed: 07/03/2008 [employer] Patient Information 2013 [employer].  

## 2012-04-25 NOTE — Assessment & Plan Note (Signed)
Well controlled, no changes to meds 

## 2012-04-25 NOTE — Assessment & Plan Note (Signed)
Still low but improved. Continue current dosing for now.

## 2012-04-25 NOTE — Assessment & Plan Note (Signed)
Has been seen by Dr Venetia Maxon and they have concerned herniations and spurs at c4-6 and some disease at level 3 as well. Is attempting to manage his symptoms without surgery, is given a trial of Robaxin and encouraged to start Naproxen 220 mg once to twice daily

## 2012-04-25 NOTE — Progress Notes (Signed)
Patient ID: Casey Miles, male   DOB: [birth year], 47 y.o.   MRN: [id number] Casey Miles [id number] Jul 09, 1965 04/25/2012      Progress Note-Follow Up  Subjective  Chief Complaint  Chief Complaint  Patient presents with  . Follow-up    labs    HPI  Patient is a 47 year old Caucasian male who is in today for followup. He acknowledges she's been under a great deal of stress. He has not been exercising and he is eating poorly. Eating a significant amount of simple carbs. No acute illness. No headache, fevers, palpitations, shortness of breath, GI or GU complaints. He is having some neck pain and back pain as well as left shoulder and left chest wall pain. Has been seen by neurosurgery and they agree that his symptoms may be consistent with DDD and arthritis/bone spurring  Past Medical History  Diagnosis Date  . Kidney stone   . GERD (gastroesophageal reflux disease)   . Chest wall pain   . SHOULDER PAIN, LEFT 07/17/2008  . PARESTHESIA 11/22/2009  . Other acute reactions to stress 09/26/2009  . NECK PAIN 09/26/2009  . 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 COSTOCHONDRITIS, RECURRENT 05/06/2010  . Chronic rhinitis 09/26/2009  . CHEST PAIN, ATYPICAL 05/04/2008  . Abdominal pain, epigastric 09/26/2009  . Arthralgia of multiple sites 08/13/2010  . Hyperlipidemia 10/08/2010  . Fatigue 01/08/2011  . Vitamin D deficiency 07/08/2011  . Testosterone deficiency 07/09/2011  . Elevated BP 10/08/2011    History reviewed. No pertinent past surgical history.  Family History  Problem Relation Age of Onset  . Diabetes Mother   . Hypertension Mother   . Hyperlipidemia Mother   . Diabetes Maternal Grandmother   . Heart disease Maternal Grandmother   . Colitis Maternal Grandmother   . Diabetes Paternal Grandmother   . Irritable bowel syndrome    . Cancer      Esophageal- [employer]  . Stomach cancer      Uncle  . Colon polyps      Grandmother  . Colitis Maternal Grandfather   .  Heart disease Maternal Grandfather   . Heart attack Father 11  . Diabetes Other     Family history of  . Hypertension Other     Family history of  . Heart attack Other     Family history of  . Irritable bowel syndrome Other     Family history of    History   Social History  . Marital Status: Married    Spouse Name: N/A    Number of Children: N/A  . Years of Education: N/A   Occupational History  . Not on file.   Social History Main Topics  . Smoking status: Never Smoker   . Smokeless tobacco: Never Used  . Alcohol Use: 0.0 oz/week    1-2 drink(s) per week     Comment: 1-2 per week  . Drug Use: No  . Sexually Active: Not on file   Other Topics Concern  . Not on file   Social History Narrative  . No narrative on file    Current Outpatient Prescriptions on File Prior to Visit  Medication Sig Dispense Refill  . aspirin EC 81 MG tablet Take 1 tablet (81 mg total) by mouth at bedtime.      Marland Kitchen CALCIUM-MAGNESIUM-ZINC PO Take 3 tablets by mouth daily.      . Cyanocobalamin (VITAMIN B-12) 1000 MCG SUBL Place 1 tablet (1,000 mcg total) under the tongue  every morning.      . niacin 500 MG CR capsule Take 2 capsules (1,000 mg total) by mouth at bedtime. Eat a lowfat snack and take a 81 mg Aspirin EC 1/2 before  30 capsule  3  . OVER THE COUNTER MEDICATION Prickly pear daily      . testosterone cypionate (DEPO-TESTOSTERONE) 200 MG/ML injection Inject 0.5 mLs (100 mg total) into the muscle every 14 (fourteen) days.  10 mL  3  . Transdermal Base CREA Apply topically. Ketamine 10%,  Baclofen 2%, Diclofenac 3%, Gabapentin 6%, Orphenadrine 5%, Bupivacaine 2%       No current facility-administered medications on file prior to visit.    No Known Allergies  Review of Systems  Review of Systems  Constitutional: Negative for fever and malaise/fatigue.  HENT: Positive for neck pain. Negative for congestion.   Eyes: Negative for discharge.  Respiratory: Negative for shortness of  breath.   Cardiovascular: Negative for chest pain, palpitations and leg swelling.  Gastrointestinal: Negative for nausea, abdominal pain and diarrhea.  Genitourinary: Negative for dysuria.  Musculoskeletal: Positive for back pain and joint pain. Negative for falls.       Left neck, shoulder and chest wall  Skin: Negative for rash.  Neurological: Negative for loss of consciousness and headaches.  Endo/Heme/Allergies: Negative for polydipsia.  Psychiatric/Behavioral: Negative for depression and suicidal ideas. The patient is not nervous/anxious and does not have insomnia.     Objective  BP 136/94  Pulse 77  Temp(Src) 97.4 F (36.3 C) (Oral)  Ht 5\' 9"  (1.753 m)  Wt 218 lb (98.884 kg)  BMI 32.18 kg/m2  SpO2 97%  Physical Exam  Physical Exam  Constitutional: He is oriented to person, place, and time and well-developed, well-nourished, and in no distress. No distress.  HENT:  Head: Normocephalic and atraumatic.  Eyes: Conjunctivae are normal.  Neck: Neck supple. No thyromegaly present.  Cardiovascular: Normal rate, regular rhythm and normal heart sounds.   No murmur heard. Pulmonary/Chest: Effort normal and breath sounds normal. No respiratory distress.  Abdominal: He exhibits no distension and no mass. There is no tenderness.  Musculoskeletal: He exhibits no edema.  Neurological: He is alert and oriented to person, place, and time.  Skin: Skin is warm.  Psychiatric: Memory, affect and judgment normal.    Lab Results  Component Value Date   TSH 0.79 04/19/2012   Lab Results  Component Value Date   WBC 5.0 04/19/2012   HGB 14.7 04/19/2012   HCT 43.4 04/19/2012   MCV 93.8 04/19/2012   PLT 189.0 04/19/2012   Lab Results  Component Value Date   CREATININE 1.2 04/19/2012   BUN 18 04/19/2012   NA 139 04/19/2012   K 4.2 04/19/2012   CL 104 04/19/2012   CO2 28 04/19/2012   Lab Results  Component Value Date   ALT 29 04/19/2012   AST 25 04/19/2012   ALKPHOS 40 04/19/2012   BILITOT  0.9 04/19/2012   Lab Results  Component Value Date   CHOL 195 04/19/2012   Lab Results  Component Value Date   HDL 28.40* 04/19/2012   Lab Results  Component Value Date   LDLCALC 114* 08/13/2010   Lab Results  Component Value Date   TRIG 737.0* 04/19/2012   Lab Results  Component Value Date   CHOLHDL 7 04/19/2012     Assessment & Plan  DEGENERATIVE DISC DISEASE, CERVICAL SPINE Has been seen by Dr Venetia Maxon and they have concerned herniations and spurs at  c4-6 and some disease at level 3 as well. Is attempting to manage his symptoms without surgery, is given a trial of Robaxin and encouraged to start Naproxen 220 mg once to twice daily  Elevated BP Well controlled, no changes to meds  Testosterone deficiency Still low but improved. Continue current dosing for now.   Hyperlipidemia Triglycerides increased greatly, avoid simple carbs, which he has been eating more of. Switch to krill oil and increase fiber. Increase Niacin to 2000 mg and recheck in 3 months

## 2012-04-25 NOTE — Assessment & Plan Note (Signed)
Triglycerides increased greatly, avoid simple carbs, which he has been eating more of. Switch to krill oil and increase fiber. Increase Niacin to 2000 mg and recheck in 3 months

## 2012-07-23 ENCOUNTER — Ambulatory Visit: Payer: 59

## 2012-07-23 ENCOUNTER — Other Ambulatory Visit (INDEPENDENT_AMBULATORY_CARE_PROVIDER_SITE_OTHER): Payer: 59

## 2012-07-23 DIAGNOSIS — E291 Testicular hypofunction: Secondary | ICD-10-CM

## 2012-07-23 DIAGNOSIS — Z0000 Encounter for general adult medical examination without abnormal findings: Secondary | ICD-10-CM

## 2012-07-23 LAB — LIPID PANEL
Cholesterol: 183 mg/dL (ref 0–200)
Total CHOL/HDL Ratio: 5
Triglycerides: 204 mg/dL — ABNORMAL HIGH (ref 0.0–149.0)

## 2012-07-23 LAB — RENAL FUNCTION PANEL
CO2: 30 mEq/L (ref 19–32)
Calcium: 9.4 mg/dL (ref 8.4–10.5)
Glucose, Bld: 86 mg/dL (ref 70–99)
Potassium: 4.6 mEq/L (ref 3.5–5.1)
Sodium: 139 mEq/L (ref 135–145)

## 2012-07-23 LAB — CBC WITH DIFFERENTIAL/PLATELET
Basophils Relative: 0 % (ref 0–1)
Eosinophils Absolute: 0.1 10*3/uL (ref 0.0–0.7)
Hemoglobin: 15.3 g/dL (ref 13.0–17.0)
MCH: 31.7 pg (ref 26.0–34.0)
MCHC: 36 g/dL (ref 30.0–36.0)
Monocytes Relative: 7 % (ref 3–12)
Neutro Abs: 3 10*3/uL (ref 1.7–7.7)
Neutrophils Relative %: 56 % (ref 43–77)
Platelets: 204 10*3/uL (ref 150–400)
RBC: 4.82 MIL/uL (ref 4.22–5.81)

## 2012-07-23 LAB — LDL CHOLESTEROL, DIRECT: Direct LDL: 98 mg/dL

## 2012-07-23 LAB — TESTOSTERONE: Testosterone: 341.46 ng/dL — ABNORMAL LOW (ref 350.00–890.00)

## 2012-07-23 LAB — HEPATIC FUNCTION PANEL: Total Bilirubin: 1.4 mg/dL — ABNORMAL HIGH (ref 0.3–1.2)

## 2012-07-23 NOTE — Progress Notes (Signed)
Labs only

## 2012-07-28 ENCOUNTER — Telehealth: Payer: Self-pay | Admitting: Family Medicine

## 2012-07-28 ENCOUNTER — Encounter: Payer: BC Managed Care – PPO | Admitting: Family Medicine

## 2012-07-28 ENCOUNTER — Ambulatory Visit (INDEPENDENT_AMBULATORY_CARE_PROVIDER_SITE_OTHER): Payer: 59 | Admitting: Family Medicine

## 2012-07-28 ENCOUNTER — Encounter: Payer: Self-pay | Admitting: Family Medicine

## 2012-07-28 VITALS — BP 148/86 | HR 70 | Temp 98.0°F | Ht 68.0 in | Wt 217.1 lb

## 2012-07-28 DIAGNOSIS — L309 Dermatitis, unspecified: Secondary | ICD-10-CM

## 2012-07-28 DIAGNOSIS — R0789 Other chest pain: Secondary | ICD-10-CM

## 2012-07-28 DIAGNOSIS — E291 Testicular hypofunction: Secondary | ICD-10-CM

## 2012-07-28 DIAGNOSIS — E785 Hyperlipidemia, unspecified: Secondary | ICD-10-CM

## 2012-07-28 DIAGNOSIS — E349 Endocrine disorder, unspecified: Secondary | ICD-10-CM

## 2012-07-28 DIAGNOSIS — E559 Vitamin D deficiency, unspecified: Secondary | ICD-10-CM

## 2012-07-28 DIAGNOSIS — Z0000 Encounter for general adult medical examination without abnormal findings: Secondary | ICD-10-CM

## 2012-07-28 DIAGNOSIS — L259 Unspecified contact dermatitis, unspecified cause: Secondary | ICD-10-CM

## 2012-07-28 DIAGNOSIS — R030 Elevated blood-pressure reading, without diagnosis of hypertension: Secondary | ICD-10-CM

## 2012-07-28 MED ORDER — TRIAMCINOLONE ACETONIDE 0.1 % EX CREA: TOPICAL_CREAM | Freq: Every evening | CUTANEOUS | Status: DC | PRN

## 2012-07-28 NOTE — Telephone Encounter (Signed)
Labs ordered and pts spouse informed to have pt come (no appt needed) to office to a week prior to office visit to get fasting labs

## 2012-07-28 NOTE — Telephone Encounter (Signed)
Labs prior to next visit lipid, renal, cbc, tsh, testosterone, hepatic

## 2012-07-28 NOTE — Telephone Encounter (Signed)
PATIENT UNDERSTOOD THAT HE WAS TO HAVE LABS PRIOR TO NEXT VISIT.  THERE WAS NOTHING NOTED ON THE CHECK OUT SHEET.  PLEASE [facility].  I TOLD THE PATIENT I WOULD E MAIL HIM TO ADVISE

## 2012-07-28 NOTE — Patient Instructions (Addendum)
Digestive Advantage probiotic daily Consider Salon pas ointment or patches Hyland's rest leg/cramp meds  DASH Diet The DASH diet stands for "Dietary Approaches to Stop Hypertension." It is a healthy eating plan that has been shown to reduce high blood pressure (hypertension) in as little as 14 days, while also possibly providing other significant health benefits. These other health benefits include reducing the risk of breast cancer after menopause and reducing the risk of type 2 diabetes, heart disease, colon cancer, and stroke. Health benefits also include weight loss and slowing kidney failure in patients with chronic kidney disease.  DIET GUIDELINES  Limit salt (sodium). Your diet should contain less than 1500 mg of sodium daily.  Limit refined or processed carbohydrates. Your diet should include mostly whole grains. Desserts and added sugars should be used sparingly.  Include small amounts of heart-healthy fats. These types of fats include nuts, oils, and tub margarine. Limit saturated and trans fats. These fats have been shown to be harmful in the body. CHOOSING FOODS  The following food groups are based on a 2000 calorie diet. See your Registered Dietitian for individual calorie needs. Grains and Grain Products (6 to 8 servings daily)  Eat More Often: Whole-wheat bread, brown rice, whole-grain or wheat pasta, quinoa, popcorn without added fat or salt (air popped).  Eat Less Often: White bread, white pasta, white rice, cornbread. Vegetables (4 to 5 servings daily)  Eat More Often: Fresh, frozen, and canned vegetables. Vegetables may be raw, steamed, roasted, or grilled with a minimal amount of fat.  Eat Less Often/Avoid: Creamed or fried vegetables. Vegetables in a cheese sauce. Fruit (4 to 5 servings daily)  Eat More Often: All fresh, canned (in natural juice), or frozen fruits. Dried fruits without added sugar. One hundred percent fruit juice ( cup [237 mL] daily).  Eat Less  Often: Dried fruits with added sugar. Canned fruit in light or heavy syrup. [employer], Fish, and Poultry (2 servings or less daily. One serving is 3 to 4 oz [85-114 g]).  Eat More Often: Ninety percent or leaner ground beef, tenderloin, sirloin. Round cuts of beef, chicken breast, [place] breast. All fish. Grill, bake, or broil your meat. Nothing should be fried.  Eat Less Often/Avoid: Fatty cuts of meat, [place], or chicken leg, thigh, or wing. Fried cuts of meat or fish. Dairy (2 to 3 servings)  Eat More Often: Low-fat or fat-free milk, low-fat plain or light yogurt, reduced-fat or part-skim cheese.  Eat Less Often/Avoid: Milk (whole, 2%).Whole milk yogurt. Full-fat cheeses. Nuts, Seeds, and Legumes (4 to 5 servings per week)  Eat More Often: All without added salt.  Eat Less Often/Avoid: Salted nuts and seeds, canned beans with added salt. Fats and Sweets (limited)  Eat More Often: Vegetable oils, tub margarines without trans fats, sugar-free gelatin. Mayonnaise and salad dressings.  Eat Less Often/Avoid: Coconut oils, palm oils, butter, stick margarine, cream, half and half, cookies, candy, pie. FOR MORE INFORMATION The Dash Diet Eating Plan: [email or web address] Document Released: 02/06/2011 Document Revised: 05/12/2011 Document Reviewed: 02/06/2011 [facility] Patient Information 2014 [place], [place].

## 2012-07-28 NOTE — Progress Notes (Signed)
Patient ID: Casey Miles, male   DOB: Feb 18, 1966, 47 y.o.   MRN: [id number] Casey Miles [id number] 25-Jun-1965 07/28/2012      Progress Note-Follow Up  Subjective  Chief Complaint  Chief Complaint  Patient presents with  . Annual Exam    physical    HPI  Patient is a 47 year old Caucasian male in today for annual exam. Overall he is doing fairly well. 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 Continues to have back and chest wall pain on the left. Ongoing for years and staples. No palpitations or shortness of breath. No GI or take his medications as prescribed and has been taking his Krill and niacin. Continues to struggle with fatigue but not out of portion to his scheduled  Past Medical History  Diagnosis Date  . Kidney stone   . GERD (gastroesophageal reflux disease)   . Chest wall pain   . SHOULDER PAIN, LEFT 07/17/2008  . PARESTHESIA 11/22/2009  . Other acute reactions to stress 09/26/2009  . NECK PAIN 09/26/2009  . 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 COSTOCHONDRITIS, RECURRENT 05/06/2010  . Chronic rhinitis 09/26/2009  . CHEST PAIN, ATYPICAL 05/04/2008  . Abdominal pain, epigastric 09/26/2009  . Arthralgia of multiple sites 08/13/2010  . Hyperlipidemia 10/08/2010  . Fatigue 01/08/2011  . Vitamin D deficiency 07/08/2011  . Testosterone deficiency 07/09/2011  . Elevated BP 10/08/2011    History reviewed. No pertinent past surgical history.  Family History  Problem Relation Age of Onset  . Diabetes Mother   . Hypertension Mother   . Hyperlipidemia Mother   . Diabetes Maternal Grandmother   . Heart disease Maternal Grandmother   . Colitis Maternal Grandmother   . Diabetes Paternal Grandmother   . Irritable bowel syndrome    . Cancer      Esophageal- [employer]  . Stomach cancer      Uncle  . Colon polyps      Grandmother  . Colitis Maternal Grandfather   .  Heart disease Maternal Grandfather   . Heart attack Father 58  . Diabetes Other     Family history of  . Hypertension Other     Family history of  . Heart attack Other     Family history of  . Irritable bowel syndrome Other     Family history of    History   Social History  . Marital Status: Married    Spouse Name: N/A    Number of Children: N/A  . Years of Education: N/A   Occupational History  . Not on file.   Social History Main Topics  . Smoking status: Never Smoker   . Smokeless tobacco: Never Used  . Alcohol Use: 0.0 oz/week    1-2 drink(s) per week     Comment: 1-2 per week  . Drug Use: No  . Sexually Active: Not on file   Other Topics Concern  . Not on file   Social History Narrative  . No narrative on file    Current Outpatient Prescriptions on File Prior to Visit  Medication Sig Dispense Refill  . aspirin EC 81 MG tablet Take 1 tablet (81 mg total) by mouth at bedtime.      [facility] CALCIUM-MAGNESIUM-ZINC PO Take 3 tablets by mouth daily.      . Cyanocobalamin (VITAMIN B-12) 1000 MCG SUBL Place 1 tablet (1,000 mcg  total) under the tongue every morning.      [facility] CAPS 1 cap po daily      . methocarbamol (ROBAXIN) 500 MG tablet Take 1 tablet (500 mg total) by mouth 3 (three) times daily as needed. Back pain  60 tablet  2  . niacin 500 MG CR capsule Take 2 capsules (1,000 mg total) by mouth at bedtime. Eat a lowfat snack and take a 81 mg Aspirin EC 1/2 before  30 capsule  3  . testosterone cypionate (DEPO-TESTOSTERONE) 200 MG/ML injection Inject 0.5 mLs (100 mg total) into the muscle every 14 (fourteen) days.  10 mL  3  . ALPRAZolam (XANAX) 0.25 MG tablet Take 1 tablet (0.25 mg total) by mouth 2 (two) times daily as needed for sleep or anxiety.  40 tablet  1   No current facility-administered medications on file prior to visit.    No Known Allergies  Review of Systems  Review of Systems  Constitutional: Negative for fever and malaise/fatigue.  HENT:  Negative for congestion.   Eyes: Negative for discharge.  Respiratory: Negative for shortness of breath.   Cardiovascular: Negative for chest pain, palpitations and leg swelling.  Gastrointestinal: Negative for nausea, abdominal pain and diarrhea.  Genitourinary: Negative for dysuria.  Musculoskeletal: Negative for falls.  Skin: Negative for rash.  Neurological: Negative for loss of consciousness and headaches.  Endo/Heme/Allergies: Negative for polydipsia.  Psychiatric/Behavioral: Negative for depression and suicidal ideas. The patient is not nervous/anxious and does not have insomnia.     Objective  BP 132/96  Pulse 70  Temp(Src) 98 F (36.7 C) (Oral)  Ht 5\' 8"  (1.727 m)  Wt 217 lb 1.3 oz (98.467 kg)  BMI 33.01 kg/m2  SpO2 97%  Physical Exam  Physical Exam  Constitutional: He is oriented to person, place, and time and well-developed, well-nourished, and in no distress. No distress.  HENT:  Head: Normocephalic and atraumatic.  Eyes: Conjunctivae are normal.  Neck: Neck supple. No thyromegaly present.  Cardiovascular: Normal rate, regular rhythm and normal heart sounds.   No murmur heard. Pulmonary/Chest: Effort normal and breath sounds normal. No respiratory distress.  Abdominal: He exhibits no distension and no mass. There is no tenderness.  Musculoskeletal: He exhibits no edema.  Neurological: He is alert and oriented to person, place, and time.  Skin: Skin is warm.  Psychiatric: Memory, affect and judgment normal.    Lab Results  Component Value Date   TSH 1.15 07/23/2012   Lab Results  Component Value Date   WBC 5.3 07/23/2012   HGB 15.3 07/23/2012   HCT 42.5 07/23/2012   MCV 88.2 07/23/2012   PLT 204 07/23/2012   Lab Results  Component Value Date   CREATININE 1.3 07/23/2012   BUN 15 07/23/2012   NA 139 07/23/2012   K 4.6 07/23/2012   CL 102 07/23/2012   CO2 30 07/23/2012   Lab Results  Component Value Date   ALT 24 07/23/2012   AST 23 07/23/2012   ALKPHOS 40  07/23/2012   BILITOT 1.4* 07/23/2012   Lab Results  Component Value Date   CHOL 183 07/23/2012   Lab Results  Component Value Date   HDL 36.60* 07/23/2012   Lab Results  Component Value Date   LDLCALC 114* 08/13/2010   Lab Results  Component Value Date   TRIG 204.0* 07/23/2012   Lab Results  Component Value Date   CHOLHDL 5 07/23/2012     Assessment & Plan  Testosterone deficiency  Improved nearly to normal. No changes to dosing today  Elevated BP Mild elevation, encouraged DASH diet and increased exercise, 8 hours of sleep nightly.   CHEST PAIN, ATYPICAL Persistent unchanged, confirmed with neurosurgery that this is likely to be related to his back disease. It has been present for years and always on left anterior chest. Tolerable.  Hyperlipidemia Has improved with niacin, krill oil and dietary changes.   Preventative health care Encouraged DASH diet, regular exercise, regular 8 hours of sleep. Attempt to keep stress down. Avoid trans fats.

## 2012-08-01 ENCOUNTER — Encounter: Payer: Self-pay | Admitting: Family Medicine

## 2012-08-01 DIAGNOSIS — Z0000 Encounter for general adult medical examination without abnormal findings: Secondary | ICD-10-CM | POA: Insufficient documentation

## 2012-08-01 HISTORY — DX: Encounter for general adult medical examination without abnormal findings: Z00.00

## 2012-08-01 NOTE — Assessment & Plan Note (Signed)
Persistent unchanged, confirmed with neurosurgery that this is likely to be related to his back disease. It has been present for years and always on left anterior chest. Tolerable.

## 2012-08-01 NOTE — Assessment & Plan Note (Signed)
Mild elevation, encouraged DASH diet and increased exercise, 8 hours of sleep nightly.

## 2012-08-01 NOTE — Assessment & Plan Note (Signed)
Has improved with niacin, krill oil and dietary changes.

## 2012-08-01 NOTE — Assessment & Plan Note (Signed)
Improved nearly to normal. No changes to dosing today

## 2012-08-01 NOTE — Assessment & Plan Note (Signed)
Encouraged DASH diet, regular exercise, regular 8 hours of sleep. Attempt to keep stress down. Avoid trans fats.

## 2012-11-24 ENCOUNTER — Telehealth: Payer: Self-pay | Admitting: *Deleted

## 2012-11-24 DIAGNOSIS — R7989 Other specified abnormal findings of blood chemistry: Secondary | ICD-10-CM

## 2012-11-24 MED ORDER — TESTOSTERONE CYPIONATE 200 MG/ML IM SOLN: 100.0000 mg | INTRAMUSCULAR | Status: DC

## 2012-11-24 NOTE — Telephone Encounter (Signed)
OK to refill Testosterone with same sig, 10 ml til seen in November

## 2012-11-24 NOTE — Telephone Encounter (Signed)
Faxed refill request received from pharmacy for Testosterone Cypionate Last filled by MD on 12.20.13, #10 mL x 3 Last AEX - 05.28.14 Next AEX - 6 Months Please Advise/SLS

## 2012-11-24 NOTE — Telephone Encounter (Signed)
Will fax in the morning after MD's signature

## 2012-12-03 ENCOUNTER — Telehealth: Payer: Self-pay

## 2012-12-03 NOTE — Telephone Encounter (Signed)
PA sent for testosterone cypionate

## 2012-12-10 NOTE — Telephone Encounter (Signed)
Faxed over more PA request for testosterone

## 2012-12-13 NOTE — Telephone Encounter (Signed)
Approved 11-03-12 through 12-10-13

## 2013-01-06 ENCOUNTER — Other Ambulatory Visit: Payer: Self-pay

## 2013-01-11 ENCOUNTER — Encounter: Payer: Self-pay | Admitting: Family Medicine

## 2013-01-14 ENCOUNTER — Encounter: Payer: Self-pay | Admitting: Family Medicine

## 2013-01-14 ENCOUNTER — Other Ambulatory Visit: Payer: Self-pay | Admitting: Family Medicine

## 2013-01-14 DIAGNOSIS — E785 Hyperlipidemia, unspecified: Secondary | ICD-10-CM

## 2013-01-14 DIAGNOSIS — IMO0001 Reserved for inherently not codable concepts without codable children: Secondary | ICD-10-CM

## 2013-01-14 DIAGNOSIS — E559 Vitamin D deficiency, unspecified: Secondary | ICD-10-CM

## 2013-01-14 DIAGNOSIS — R7989 Other specified abnormal findings of blood chemistry: Secondary | ICD-10-CM

## 2013-01-14 DIAGNOSIS — E349 Endocrine disorder, unspecified: Secondary | ICD-10-CM

## 2013-01-17 LAB — RENAL FUNCTION PANEL
Albumin: 4.4 g/dL (ref 3.5–5.2)
BUN: 20 mg/dL (ref 6–23)
Creat: 1.22 mg/dL (ref 0.50–1.35)
Phosphorus: 2.5 mg/dL (ref 2.3–4.6)

## 2013-01-17 LAB — LIPID PANEL
Cholesterol: 173 mg/dL (ref 0–200)
HDL: 37 mg/dL — ABNORMAL LOW (ref >39)
LDL Cholesterol: 93 mg/dL (ref 0–99)
Total CHOL/HDL Ratio: 4.7 Ratio
Triglycerides: 217 mg/dL — ABNORMAL HIGH (ref <150)
VLDL: 43 mg/dL — ABNORMAL HIGH (ref 0–40)

## 2013-01-17 LAB — TSH: TSH: 0.693 u[IU]/mL (ref 0.350–4.500)

## 2013-01-17 LAB — HEPATIC FUNCTION PANEL
Albumin: 4.4 g/dL (ref 3.5–5.2)
Alkaline Phosphatase: 43 U/L (ref 39–117)
Total Bilirubin: 0.6 mg/dL (ref 0.3–1.2)
Total Protein: 7.1 g/dL (ref 6.0–8.3)

## 2013-01-17 LAB — CBC
Hemoglobin: 15 g/dL (ref 13.0–17.0)
MCH: 31.6 pg (ref 26.0–34.0)
MCHC: 36.3 g/dL — ABNORMAL HIGH (ref 30.0–36.0)
Platelets: 218 10*3/uL (ref 150–400)
RBC: 4.74 MIL/uL (ref 4.22–5.81)

## 2013-01-17 LAB — TESTOSTERONE: Testosterone: 209 ng/dL — ABNORMAL LOW (ref 300–890)

## 2013-01-18 ENCOUNTER — Ambulatory Visit (INDEPENDENT_AMBULATORY_CARE_PROVIDER_SITE_OTHER): Payer: 59 | Admitting: Family Medicine

## 2013-01-18 ENCOUNTER — Encounter: Payer: Self-pay | Admitting: Family Medicine

## 2013-01-18 ENCOUNTER — Telehealth: Payer: Self-pay | Admitting: Family Medicine

## 2013-01-18 VITALS — BP 142/92 | HR 72 | Temp 98.2°F | Ht 68.0 in | Wt 218.0 lb

## 2013-01-18 DIAGNOSIS — E349 Endocrine disorder, unspecified: Secondary | ICD-10-CM

## 2013-01-18 DIAGNOSIS — M549 Dorsalgia, unspecified: Secondary | ICD-10-CM

## 2013-01-18 DIAGNOSIS — E785 Hyperlipidemia, unspecified: Secondary | ICD-10-CM

## 2013-01-18 DIAGNOSIS — E291 Testicular hypofunction: Secondary | ICD-10-CM

## 2013-01-18 DIAGNOSIS — M940 Chondrocostal junction syndrome [Tietze]: Secondary | ICD-10-CM

## 2013-01-18 DIAGNOSIS — E559 Vitamin D deficiency, unspecified: Secondary | ICD-10-CM

## 2013-01-18 DIAGNOSIS — Z0000 Encounter for general adult medical examination without abnormal findings: Secondary | ICD-10-CM

## 2013-01-18 DIAGNOSIS — IMO0001 Reserved for inherently not codable concepts without codable children: Secondary | ICD-10-CM

## 2013-01-18 DIAGNOSIS — R030 Elevated blood-pressure reading, without diagnosis of hypertension: Secondary | ICD-10-CM

## 2013-01-18 LAB — VITAMIN D 25 HYDROXY (VIT D DEFICIENCY, FRACTURES): Vit D, 25-Hydroxy: 34 ng/mL (ref 30–89)

## 2013-01-18 MED ORDER — NIACIN ER 500 MG PO CPCR: 2000.0000 mg | ORAL_CAPSULE | Freq: Every day | ORAL | Status: AC

## 2013-01-18 MED ORDER — CYCLOBENZAPRINE HCL 10 MG PO TABS: 10.0000 mg | ORAL_TABLET | Freq: Three times a day (TID) | ORAL | Status: DC | PRN

## 2013-01-18 NOTE — Progress Notes (Signed)
Patient ID: Casey Miles, male   DOB: 01-28-1966, 47 y.o.   MRN: [id number] Casey Miles [id number] 12/10/1965 01/18/2013      Progress Note-Follow Up  Subjective  Chief Complaint  Chief Complaint  Patient presents with  . Follow-up    6 month    HPI  Patient is a 47 year old Caucasian male who is in today for followup. Generally are great deal of stress at work and has a major meeting this afternoon but otherwise feels well. Continues to have his intermittent shoulder and atypical chest pain likely related to costochondritis but finds it tolerable at this time uses minimal medications to manage it. No other recent complaints. No headaches, fevers, palpitations, shortness of breath, GI or GU concerns. Has not been exercising regularly.  Past Medical History  Diagnosis Date  . Kidney stone   . GERD (gastroesophageal reflux disease)   . Chest wall pain   . SHOULDER PAIN, LEFT 07/17/2008  . PARESTHESIA 11/22/2009  . Other acute reactions to stress 09/26/2009  . NECK PAIN 09/26/2009  . 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 COSTOCHONDRITIS, RECURRENT 05/06/2010  . Chronic rhinitis 09/26/2009  . CHEST PAIN, ATYPICAL 05/04/2008  . Abdominal pain, epigastric 09/26/2009  . Arthralgia of multiple sites 08/13/2010  . Hyperlipidemia 10/08/2010  . Fatigue 01/08/2011  . Vitamin D deficiency 07/08/2011  . Testosterone deficiency 07/09/2011  . Elevated BP 10/08/2011  . Preventative health care 08/01/2012    History reviewed. No pertinent past surgical history.  Family History  Problem Relation Age of Onset  . Diabetes Mother   . Hypertension Mother   . Hyperlipidemia Mother   . Diabetes Maternal Grandmother   . Heart disease Maternal Grandmother   . Colitis Maternal Grandmother   . Diabetes Paternal Grandmother   . Irritable bowel syndrome    . Cancer      Esophageal- [employer]  . Stomach cancer      Uncle  . Colon polyps      Grandmother  . Colitis Maternal Grandfather    . Heart disease Maternal Grandfather   . Heart attack Father 53  . Diabetes Other     Family history of  . Hypertension Other     Family history of  . Heart attack Other     Family history of  . Irritable bowel syndrome Other     Family history of  . Lupus Cousin     History   Social History  . Marital Status: Married    Spouse Name: N/A    Number of Children: N/A  . Years of Education: N/A   Occupational History  . Not on file.   Social History Main Topics  . Smoking status: Never Smoker   . Smokeless tobacco: Never Used  . Alcohol Use: 0.0 oz/week    1-2 drink(s) per week     Comment: 1-2 per week  . Drug Use: No  . Sexual Activity: Not on file   Other Topics Concern  . Not on file   Social History Narrative  . No narrative on file    Current Outpatient Prescriptions on File Prior to Visit  Medication Sig Dispense Refill  . ALPRAZolam (XANAX) 0.25 MG tablet Take 1 tablet (0.25 mg total) by mouth 2 (two) times daily as needed for sleep or anxiety.  40 tablet  1  . CALCIUM-MAGNESIUM-ZINC PO Take 3 tablets by mouth daily.      . Cyanocobalamin (VITAMIN B-12) 1000 MCG SUBL Place  1 tablet (1,000 mcg total) under the tongue every morning.      [facility] CAPS 1 cap po daily      . methocarbamol (ROBAXIN) 500 MG tablet Take 1 tablet (500 mg total) by mouth 3 (three) times daily as needed. Back pain  60 tablet  2  . niacin 500 MG CR capsule Take 2 capsules (1,000 mg total) by mouth at bedtime. Eat a lowfat snack and take a 81 mg Aspirin EC 1/2 before  30 capsule  3  . testosterone cypionate (DEPO-TESTOSTERONE) 200 MG/ML injection Inject 0.5 mLs (100 mg total) into the muscle every 14 (fourteen) days.  10 mL  0  . triamcinolone cream (KENALOG) 0.1 % Apply topically at bedtime as needed. rash  45 g  2   No current facility-administered medications on file prior to visit.    No Known Allergies  Review of Systems  Review of Systems  Constitutional: Negative for  fever and malaise/fatigue.  HENT: Negative for congestion.   Eyes: Negative for discharge.  Respiratory: Negative for shortness of breath.   Cardiovascular: Negative for chest pain, palpitations and leg swelling.  Gastrointestinal: Negative for nausea, abdominal pain and diarrhea.  Genitourinary: Negative for dysuria.  Musculoskeletal: Negative for falls.  Skin: Negative for rash.  Neurological: Negative for loss of consciousness and headaches.  Endo/Heme/Allergies: Negative for polydipsia.  Psychiatric/Behavioral: Negative for depression and suicidal ideas. The patient is not nervous/anxious and does not have insomnia.     Objective  BP 140/98  Pulse 72  Temp(Src) 98.2 F (36.8 C) (Oral)  Ht 5\' 8"  (1.727 m)  Wt 218 lb (98.884 kg)  BMI 33.15 kg/m2  SpO2 98%  Physical Exam  Physical Exam  Constitutional: He is oriented to person, place, and time and well-developed, well-nourished, and in no distress. No distress.  HENT:  Head: Normocephalic and atraumatic.  Eyes: Conjunctivae are normal.  Neck: Neck supple. No thyromegaly present.  Cardiovascular: Normal rate, regular rhythm and normal heart sounds.   No murmur heard. Pulmonary/Chest: Effort normal and breath sounds normal. No respiratory distress.  Abdominal: He exhibits no distension and no mass. There is no tenderness.  Musculoskeletal: He exhibits no edema.  Neurological: He is alert and oriented to person, place, and time.  Skin: Skin is warm.  Psychiatric: Memory, affect and judgment normal.    Lab Results  Component Value Date   TSH 0.693 01/17/2013   Lab Results  Component Value Date   WBC 5.0 01/17/2013   HGB 15.0 01/17/2013   HCT 41.3 01/17/2013   MCV 87.1 01/17/2013   PLT 218 01/17/2013   Lab Results  Component Value Date   CREATININE 1.22 01/17/2013   BUN 20 01/17/2013   NA 138 01/17/2013   K 4.7 01/17/2013   CL 104 01/17/2013   CO2 28 01/17/2013   Lab Results  Component Value Date   ALT 22  01/17/2013   AST 28 01/17/2013   ALKPHOS 43 01/17/2013   BILITOT 0.6 01/17/2013   Lab Results  Component Value Date   CHOL 173 01/17/2013   Lab Results  Component Value Date   HDL 37* 01/17/2013   Lab Results  Component Value Date   LDLCALC 93 01/17/2013   Lab Results  Component Value Date   TRIG 217* 01/17/2013   Lab Results  Component Value Date   CHOLHDL 4.7 01/17/2013     Assessment & Plan  Preventative health care Took flu shot on 12/15/12  Elevated BP  Improved some on recheck, encouraged dash diet and regular exercise and he will monitor his bp and let us know if it is up  Testosterone deficiency Was a few days overdue for shot when he came in for last labs will not change dosage of testosterone at this time.   COSTOCHONDRITIS, RECURRENT Stable and tolerable, no changes.   Other and unspecified hyperlipidemia Avoid trans fats, increase exercise, minimize simple carbs and saturated fats  Vitamin D deficiency Normal on recent blood draw

## 2013-01-18 NOTE — Telephone Encounter (Signed)
comments: Labs prior lipid, renal, hepatic, tsh, testosterone, cbc   Patient has cpe on 07/29/13 and will be going to [facility] [address] lab.

## 2013-01-18 NOTE — Patient Instructions (Signed)
Consider starting Red Yeast Rice and CoQ 10 enzyme for the cholesterol    Cholesterol Cholesterol is a white, waxy, fat-like protein needed by your body in small amounts. The liver makes all the cholesterol you need. It is carried from the liver by the blood through the blood vessels. Deposits (plaque) may build up on blood vessel walls. This makes the arteries narrower and stiffer. Plaque increases the risk for heart attack and stroke. You cannot feel your cholesterol level even if it is very high. The only way to know is by a blood test to check your lipid (fats) levels. Once you know your cholesterol levels, you should keep a record of the test results. Work with your caregiver to to keep your levels in the desired range. WHAT THE RESULTS MEAN:  Total cholesterol is a rough measure of all the cholesterol in your blood.  LDL is the so-called bad cholesterol. This is the type that deposits cholesterol in the walls of the arteries. You want this level to be low.  HDL is the good cholesterol because it cleans the arteries and carries the LDL away. You want this level to be high.  Triglycerides are fat that the body can either burn for energy or store. High levels are closely linked to heart disease. DESIRED LEVELS:  Total cholesterol below 200.  LDL below 100 for people at risk, below 70 for very high risk.  HDL above 50 is good, above 60 is best.  Triglycerides below 150. HOW TO LOWER YOUR CHOLESTEROL:  Diet.  Choose fish or white meat chicken and [place], roasted or baked. Limit fatty cuts of red meat, fried foods, and processed meats, such as sausage and lunch meat.  Eat lots of fresh fruits and vegetables. Choose whole grains, beans, pasta, potatoes and cereals.  Use only small amounts of olive, corn or canola oils. Avoid butter, mayonnaise, shortening or palm kernel oils. Avoid foods with trans-fats.  Use skim/nonfat milk and low-fat/nonfat yogurt and cheeses. Avoid whole milk,  cream, ice cream, egg yolks and cheeses. Healthy desserts include angel food cake, ginger snaps, animal crackers, hard candy, popsicles, and low-fat/nonfat frozen yogurt. Avoid pastries, cakes, pies and cookies.  Exercise.  A regular program helps decrease LDL and raises HDL.  Helps with weight control.  Do things that increase your activity level like gardening, walking, or taking the stairs.  Medication.  May be prescribed by your caregiver to help lowering cholesterol and the risk for heart disease.  You may need medicine even if your levels are normal if you have several risk factors. HOME CARE INSTRUCTIONS   Follow your diet and exercise programs as suggested by your caregiver.  Take medications as directed.  Have blood work done when your caregiver feels it is necessary. MAKE SURE YOU:   Understand these instructions.  Will watch your condition.  Will get help right away if you are not doing well or get worse. Document Released: 11/12/2000 Document Revised: 05/12/2011 Document Reviewed: 05/05/2007 [facility] Patient Information 2014 [place], [place].

## 2013-01-18 NOTE — Progress Notes (Signed)
Pre visit review using our clinic review tool, if applicable. No additional management support is needed unless otherwise documented below in the visit note. 

## 2013-01-19 ENCOUNTER — Ambulatory Visit: Payer: 59 | Admitting: Family Medicine

## 2013-01-23 ENCOUNTER — Encounter: Payer: Self-pay | Admitting: Family Medicine

## 2013-01-23 DIAGNOSIS — E782 Mixed hyperlipidemia: Secondary | ICD-10-CM | POA: Insufficient documentation

## 2013-01-23 DIAGNOSIS — E785 Hyperlipidemia, unspecified: Secondary | ICD-10-CM

## 2013-01-23 HISTORY — DX: Hyperlipidemia, unspecified: E78.5

## 2013-01-23 NOTE — Assessment & Plan Note (Signed)
Avoid trans fats, increase exercise, minimize simple carbs and saturated fats

## 2013-01-23 NOTE — Assessment & Plan Note (Signed)
Stable and tolerable, no changes.

## 2013-01-23 NOTE — Assessment & Plan Note (Signed)
Took flu shot on 12/15/12

## 2013-01-23 NOTE — Assessment & Plan Note (Signed)
Normal on recent blood draw

## 2013-01-23 NOTE — Assessment & Plan Note (Signed)
Improved some on recheck, encouraged dash diet and regular exercise and he will monitor his bp and let us know if it is up

## 2013-01-23 NOTE — Assessment & Plan Note (Signed)
Was a few days overdue for shot when he came in for last labs will not change dosage of testosterone at this time.

## 2013-07-29 ENCOUNTER — Encounter: Payer: 59 | Admitting: Family Medicine

## 2013-08-03 ENCOUNTER — Other Ambulatory Visit: Payer: Self-pay | Admitting: Family Medicine

## 2013-08-04 NOTE — Telephone Encounter (Signed)
rx printed for md to sign and fax 

## 2013-10-07 ENCOUNTER — Other Ambulatory Visit: Payer: Self-pay | Admitting: Family Medicine

## 2013-10-07 LAB — HEPATIC FUNCTION PANEL
ALBUMIN: 4.6 g/dL (ref 3.5–5.2)
ALK PHOS: 44 U/L (ref 39–117)
ALT: 16 U/L (ref 0–53)
AST: 22 U/L (ref 0–37)
Bilirubin, Direct: 0.2 mg/dL (ref 0.0–0.3)
Indirect Bilirubin: 1.1 mg/dL (ref 0.2–1.2)
TOTAL PROTEIN: 7.1 g/dL (ref 6.0–8.3)
Total Bilirubin: 1.3 mg/dL — ABNORMAL HIGH (ref 0.2–1.2)

## 2013-10-07 LAB — CBC
HCT: 41.8 % (ref 39.0–52.0)
HEMOGLOBIN: 15 g/dL (ref 13.0–17.0)
MCH: 31.3 pg (ref 26.0–34.0)
MCHC: 35.9 g/dL (ref 30.0–36.0)
MCV: 87.3 fL (ref 78.0–100.0)
Platelets: 207 10*3/uL (ref 150–400)
RBC: 4.79 MIL/uL (ref 4.22–5.81)
RDW: 12.7 % (ref 11.5–15.5)
WBC: 5.4 10*3/uL (ref 4.0–10.5)

## 2013-10-07 LAB — RENAL FUNCTION PANEL
Albumin: 4.6 g/dL (ref 3.5–5.2)
BUN: 16 mg/dL (ref 6–23)
CALCIUM: 9.3 mg/dL (ref 8.4–10.5)
CO2: 28 mEq/L (ref 19–32)
CREATININE: 1.25 mg/dL (ref 0.50–1.35)
Chloride: 101 mEq/L (ref 96–112)
Glucose, Bld: 93 mg/dL (ref 70–99)
PHOSPHORUS: 2.8 mg/dL (ref 2.3–4.6)
POTASSIUM: 4.1 meq/L (ref 3.5–5.3)
Sodium: 138 mEq/L (ref 135–145)

## 2013-10-07 LAB — LIPID PANEL
Cholesterol: 148 mg/dL (ref 0–200)
HDL: 37 mg/dL — ABNORMAL LOW (ref >39)
LDL Cholesterol: 73 mg/dL (ref 0–99)
TRIGLYCERIDES: 192 mg/dL — AB (ref <150)
Total CHOL/HDL Ratio: 4 Ratio
VLDL: 38 mg/dL (ref 0–40)

## 2013-10-07 LAB — TESTOSTERONE: TESTOSTERONE: 229 ng/dL — AB (ref 300–890)

## 2013-10-07 LAB — TSH: TSH: 0.782 u[IU]/mL (ref 0.350–4.500)

## 2013-10-13 ENCOUNTER — Encounter: Payer: Self-pay | Admitting: Family Medicine

## 2013-10-13 ENCOUNTER — Ambulatory Visit (INDEPENDENT_AMBULATORY_CARE_PROVIDER_SITE_OTHER): Payer: 59 | Admitting: Family Medicine

## 2013-10-13 VITALS — BP 124/82 | HR 80 | Temp 99.2°F | Ht 68.0 in | Wt 216.0 lb

## 2013-10-13 DIAGNOSIS — R079 Chest pain, unspecified: Secondary | ICD-10-CM

## 2013-10-13 DIAGNOSIS — R0600 Dyspnea, unspecified: Secondary | ICD-10-CM

## 2013-10-13 DIAGNOSIS — R0989 Other specified symptoms and signs involving the circulatory and respiratory systems: Secondary | ICD-10-CM

## 2013-10-13 DIAGNOSIS — IMO0001 Reserved for inherently not codable concepts without codable children: Secondary | ICD-10-CM

## 2013-10-13 DIAGNOSIS — M940 Chondrocostal junction syndrome [Tietze]: Secondary | ICD-10-CM

## 2013-10-13 DIAGNOSIS — R0609 Other forms of dyspnea: Secondary | ICD-10-CM

## 2013-10-13 DIAGNOSIS — E785 Hyperlipidemia, unspecified: Secondary | ICD-10-CM

## 2013-10-13 DIAGNOSIS — Z0000 Encounter for general adult medical examination without abnormal findings: Secondary | ICD-10-CM

## 2013-10-13 DIAGNOSIS — R030 Elevated blood-pressure reading, without diagnosis of hypertension: Secondary | ICD-10-CM

## 2013-10-13 DIAGNOSIS — M2550 Pain in unspecified joint: Secondary | ICD-10-CM

## 2013-10-13 NOTE — Patient Instructions (Addendum)
Call if you want LIpitor Salon Pas patches/gel  Cholesterol Cholesterol is a white, waxy, fat-like substance needed by your body in small amounts. The liver makes all the cholesterol you need. Cholesterol is carried from the liver by the blood through the blood vessels. Deposits of cholesterol (plaque) may build up on blood vessel walls. These make the arteries narrower and stiffer. Cholesterol plaques increase the risk for heart attack and stroke.  You cannot feel your cholesterol level even if it is very high. The only way to know it is high is with a blood test. Once you know your cholesterol levels, you should keep a record of the test results. Work with your health care provider to keep your levels in the desired range.  WHAT DO THE RESULTS MEAN?  Total cholesterol is a rough measure of all the cholesterol in your blood.   LDL is the so-called bad cholesterol. This is the type that deposits cholesterol in the walls of the arteries. You want this level to be low.   HDL is the good cholesterol because it cleans the arteries and carries the LDL away. You want this level to be high.  Triglycerides are fat that the body can either burn for energy or store. High levels are closely linked to heart disease.  WHAT ARE THE DESIRED LEVELS OF CHOLESTEROL?  Total cholesterol below 200.   LDL below 100 for people at risk, below 70 for those at very high risk.   HDL above 50 is good, above 60 is best.   Triglycerides below 150.  HOW CAN I LOWER MY CHOLESTEROL?  Diet. Follow your diet programs as directed by your health care provider.   Choose fish or white meat chicken and [place], roasted or baked. Limit fatty cuts of red meat, fried foods, and processed meats, such as sausage and lunch meats.   Eat lots of fresh fruits and vegetables.  Choose whole grains, beans, pasta, potatoes, and cereals.   Use only small amounts of olive, corn, or canola oils.   Avoid butter, mayonnaise,  shortening, or palm kernel oils.  Avoid foods with trans fats.   Drink skim or nonfat milk and eat low-fat or nonfat yogurt and cheeses. Avoid whole milk, cream, ice cream, egg yolks, and full-fat cheeses.   Healthy desserts include angel food cake, ginger snaps, animal crackers, hard candy, popsicles, and low-fat or nonfat frozen yogurt. Avoid pastries, cakes, pies, and cookies.   Exercise. Follow your exercise programs as directed by your health care provider.   A regular program helps decrease LDL and raise HDL.   A regular program helps with weight control.   Do things that increase your activity level like gardening, walking, or taking the stairs. Ask your health care provider about how you can be more active in your daily life.   Medicine. Take medicine only as directed by your health care provider.   Medicine may be prescribed by your health care provider to help lower cholesterol and decrease the risk for heart disease.   If you have several risk factors, you may need medicine even if your levels are normal. Document Released: 11/12/2000 Document Revised: 07/04/2013 Document Reviewed: 12/01/2012 [facility] Patient Information 2015 [place], [place]. This information is not intended to replace advice given to you by your health care provider. Make sure you discuss any questions you have with your health care provider.

## 2013-10-13 NOTE — Progress Notes (Signed)
Pre visit review using our clinic review tool, if applicable. No additional management support is needed unless otherwise documented below in the visit note. 

## 2013-10-16 ENCOUNTER — Encounter: Payer: Self-pay | Admitting: Family Medicine

## 2013-10-16 NOTE — Assessment & Plan Note (Signed)
Encouraged heart healthy diet, increase exercise, avoid trans fats, consider a krill oil cap daily. Continue red yeast rice for now. But may need to consider statin in future

## 2013-10-16 NOTE — Assessment & Plan Note (Signed)
Patient encouraged to maintain heart healthy diet, regular exercise, adequate sleep. Consider daily probiotics. Take medications as prescribed 

## 2013-10-16 NOTE — Assessment & Plan Note (Signed)
Well controlled. Encouraged heart healthy diet such as the DASH diet and exercise as tolerated.  

## 2013-10-16 NOTE — Assessment & Plan Note (Signed)
Increased pain recently in his left neck, shoulder, arm, back an chest s/p some acupuncture treatments. Has stopped the treatments and continued massage but is still hurting. May need further referral for care if symptoms worsen

## 2013-10-16 NOTE — Assessment & Plan Note (Signed)
Try topical treatments and heat

## 2013-10-16 NOTE — Progress Notes (Signed)
Patient ID: Casey Miles, male   DOB: 1966-01-07, 48 y.o.   MRN: [id number] Casey Miles [id number] 22-Mar-1965 10/16/2013      Progress Note-Follow Up  Subjective  Chief Complaint  Chief Complaint  Patient presents with  . Annual Exam    physical    HPI  Patient is a 48 year old male in today for routine medical care.  He is in today for annual exam had been doing very well. Had been exercising and actually lost some weight but continued to struggle with his left-sided chest wall and left arm pain. It was greatly improved until her recent course of acupuncture and since the accident is regular with increased chest pain and back pain. Increased neck pain and arm pain. He has some numbness and tingling into his arm and his neck feels flushed intermittently. Notes sometimes the pain is severe enough to cause some dyspnea. Has ongoing fatigue as well. Does find himself for chest pain due to his family history of previous work up was unremarkable for any cardiac problems. Denies Palp/HA/congestion/fevers/GI or GU c/o. Taking meds as prescribed  Past Medical History  Diagnosis Date  . Kidney stone   . GERD (gastroesophageal reflux disease)   . Chest wall pain   . SHOULDER PAIN, LEFT 07/17/2008  . PARESTHESIA 11/22/2009  . Other acute reactions to stress 09/26/2009  . NECK PAIN 09/26/2009  . 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 COSTOCHONDRITIS, RECURRENT 05/06/2010  . Chronic rhinitis 09/26/2009  . CHEST PAIN, ATYPICAL 05/04/2008  . Abdominal pain, epigastric 09/26/2009  . Arthralgia of multiple sites 08/13/2010  . Hyperlipidemia 10/08/2010  . Fatigue 01/08/2011  . Vitamin D deficiency 07/08/2011  . Testosterone deficiency 07/09/2011  . Elevated BP 10/08/2011  . Preventative health care 08/01/2012  . Other and unspecified hyperlipidemia 01/23/2013    History reviewed. No pertinent past surgical history.  Family History  Problem Relation Age of Onset  . Diabetes Mother   .  Hypertension Mother   . Hyperlipidemia Mother   . Diabetes Maternal Grandmother   . Heart disease Maternal Grandmother   . Colitis Maternal Grandmother   . Diabetes Paternal Grandmother   . Irritable bowel syndrome    . Cancer      Esophageal- [employer]  . Stomach cancer      Uncle  . Colon polyps      Grandmother  . Colitis Maternal Grandfather   . Heart disease Maternal Grandfather   . Heart attack Father 74  . Diabetes Other     Family history of  . Hypertension Other     Family history of  . Heart attack Other     Family history of  . Irritable bowel syndrome Other     Family history of  . Lupus Cousin     History   Social History  . Marital Status: Married    Spouse Name: N/A    Number of Children: N/A  . Years of Education: N/A   Occupational History  . Not on file.   Social History Main Topics  . Smoking status: Never Smoker   . Smokeless tobacco: Never Used  . Alcohol Use: 0.0 oz/week    1-2 drink(s) per week     Comment: 1-2 per week  . Drug Use: No  . Sexual Activity: Yes     Comment: lives wife and children, exercises intermittently, heart healthy diet.   Other Topics Concern  . Not on file  Social History Narrative  . No narrative on file    Current Outpatient Prescriptions on File Prior to Visit  Medication Sig Dispense Refill  . CALCIUM-MAGNESIUM-ZINC PO Take 3 tablets by mouth daily.      . Cyanocobalamin (VITAMIN B-12) 1000 MCG SUBL Place 1 tablet (1,000 mcg total) under the tongue every morning.      . cyclobenzaprine (FLEXERIL) 10 MG tablet Take 1 tablet (10 mg total) by mouth 3 (three) times daily as needed for muscle spasms.  30 tablet  2  . Krill Oil CAPS 1 cap po daily      . niacin 500 MG CR capsule Take 4 capsules (2,000 mg total) by mouth at bedtime. Eat a lowfat snack and take a 81 mg Aspirin EC 1/2 before  30 capsule  3  . testosterone cypionate (DEPOTESTOTERONE CYPIONATE) 200 MG/ML injection inject 0.5 mls into the  muscle every 14 days  10 mL  0   No current facility-administered medications on file prior to visit.    No Known Allergies  Review of Systems  Review of Systems  Constitutional: Negative for fever, chills and malaise/fatigue.  HENT: Negative for congestion, hearing loss and nosebleeds.   Eyes: Negative for discharge.  Respiratory: Positive for shortness of breath. Negative for cough, sputum production and wheezing.   Cardiovascular: Positive for chest pain. Negative for palpitations and leg swelling.  Gastrointestinal: Negative for heartburn, nausea, vomiting, abdominal pain, diarrhea, constipation and blood in stool.  Genitourinary: Negative for dysuria, urgency, frequency and hematuria.  Musculoskeletal: Positive for back pain, myalgias and neck pain. Negative for falls.  Skin: Negative for rash.  Neurological: Negative for dizziness, tremors, sensory change, focal weakness, loss of consciousness, weakness and headaches.  Endo/Heme/Allergies: Negative for polydipsia. Does not bruise/bleed easily.  Psychiatric/Behavioral: Negative for depression and suicidal ideas. The patient is not nervous/anxious and does not have insomnia.     Objective  BP 124/82  Pulse 80  Temp(Src) 99.2 F (37.3 C) (Oral)  Ht 5\' 8"  (1.727 m)  Wt 216 lb 0.6 oz (97.995 kg)  BMI 32.86 kg/m2  SpO2 97%  Physical Exam  Physical Exam  Constitutional: He is oriented to person, place, and time and well-developed, well-nourished, and in no distress. No distress.  HENT:  Head: Normocephalic and atraumatic.  Eyes: Conjunctivae are normal.  Neck: Neck supple. No thyromegaly present.  Cardiovascular: Normal rate, regular rhythm and normal heart sounds.   No murmur heard. Pulmonary/Chest: Effort normal and breath sounds normal. No respiratory distress.  Abdominal: He exhibits no distension and no mass. There is no tenderness.  Musculoskeletal: He exhibits no edema.  Neurological: He is alert and oriented to  person, place, and time.  Skin: Skin is warm.  Psychiatric: Memory, affect and judgment normal.    Lab Results  Component Value Date   TSH 0.782 10/07/2013   Lab Results  Component Value Date   WBC 5.4 10/07/2013   HGB 15.0 10/07/2013   HCT 41.8 10/07/2013   MCV 87.3 10/07/2013   PLT 207 10/07/2013   Lab Results  Component Value Date   CREATININE 1.25 10/07/2013   BUN 16 10/07/2013   NA 138 10/07/2013   K 4.1 10/07/2013   CL 101 10/07/2013   CO2 28 10/07/2013   Lab Results  Component Value Date   ALT 16 10/07/2013   AST 22 10/07/2013   ALKPHOS 44 10/07/2013   BILITOT 1.3* 10/07/2013   Lab Results  Component Value Date   CHOL 148  10/07/2013   Lab Results  Component Value Date   HDL 37* 10/07/2013   Lab Results  Component Value Date   LDLCALC 73 10/07/2013   Lab Results  Component Value Date   TRIG 192* 10/07/2013   Lab Results  Component Value Date   CHOLHDL 4.0 10/07/2013     Assessment & Plan  Elevated BP Well controlled. Encouraged heart healthy diet such as the DASH diet and exercise as tolerated.   Arthralgia of multiple sites Increased pain recently in his left neck, shoulder, arm, back an chest s/p some acupuncture treatments. Has stopped the treatments and continued massage but is still hurting. May need further referral for care if symptoms worsen  COSTOCHONDRITIS, RECURRENT Try topical treatments and heat  Hyperlipidemia Encouraged heart healthy diet, increase exercise, avoid trans fats, consider a krill oil cap daily. Continue red yeast rice for now. But may need to consider statin in future  Preventative health care Patient encouraged to maintain heart healthy diet, regular exercise, adequate sleep. Consider daily probiotics. Take medications as prescribed

## 2013-10-19 ENCOUNTER — Ambulatory Visit (HOSPITAL_BASED_OUTPATIENT_CLINIC_OR_DEPARTMENT_OTHER)
Admission: RE | Admit: 2013-10-19 | Discharge: 2013-10-19 | Disposition: A | Discharge status: Home / Self Care | Payer: 59 | Source: Ambulatory Visit | Attending: Family Medicine | Admitting: Family Medicine

## 2013-10-19 ENCOUNTER — Encounter: Payer: Self-pay | Admitting: Family Medicine

## 2013-10-19 DIAGNOSIS — R072 Precordial pain: Secondary | ICD-10-CM | POA: Insufficient documentation

## 2013-10-19 DIAGNOSIS — IMO0002 Reserved for concepts with insufficient information to code with codable children: Secondary | ICD-10-CM | POA: Insufficient documentation

## 2013-10-19 DIAGNOSIS — E785 Hyperlipidemia, unspecified: Secondary | ICD-10-CM | POA: Diagnosis not present

## 2013-10-19 DIAGNOSIS — R0600 Dyspnea, unspecified: Secondary | ICD-10-CM

## 2013-10-19 DIAGNOSIS — R079 Chest pain, unspecified: Secondary | ICD-10-CM

## 2013-10-19 NOTE — Progress Notes (Signed)
  Echocardiogram 2D Echocardiogram has been performed.  Diamond Nickel 10/19/2013, 10:44 AM

## 2013-12-22 ENCOUNTER — Ambulatory Visit (INDEPENDENT_AMBULATORY_CARE_PROVIDER_SITE_OTHER): Payer: 59 | Admitting: Cardiovascular Disease

## 2013-12-22 ENCOUNTER — Encounter: Payer: Self-pay | Admitting: Cardiovascular Disease

## 2013-12-22 VITALS — BP 118/100 | HR 76 | Ht 68.0 in | Wt 217.0 lb

## 2013-12-22 DIAGNOSIS — R079 Chest pain, unspecified: Secondary | ICD-10-CM

## 2013-12-22 NOTE — Progress Notes (Signed)
History of Present Illness: 48 yo WM with history of GERD, HTN, HLD here today to re-establish cardiology care. He was seen in our office November 2011 for chest pain. He had a negative stress test and CTA negative for PE in 2011. Because of ongoing chest pain, cardiac cath was arranged on 03/12/10. No evidence of CAD. He has not been see in our office since November 2011. He has continued to have chest pain over the years. Echo August 2015 in primary care with normal LV systolic function. Describes exertional chest/abdominal pressure. He has known cervical spine issues. He thinks the pain is from his neck.   Primary Care Physician: Charlett Blake  Last Lipid Profile:Lipid Panel     Component Value Date/Time   CHOL 148 10/07/2013 0829   TRIG 192* 10/07/2013 0829   HDL 37* 10/07/2013 0829   CHOLHDL 4.0 10/07/2013 0829   VLDL 38 10/07/2013 0829   LDLCALC 73 10/07/2013 0829     Past Medical History  Diagnosis Date  . Kidney stone   . GERD (gastroesophageal reflux disease)   . Chest wall pain   . SHOULDER PAIN, LEFT 07/17/2008  . PARESTHESIA 11/22/2009  . Other acute reactions to stress 09/26/2009  . NECK PAIN 09/26/2009  . 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 COSTOCHONDRITIS, RECURRENT 05/06/2010  . Chronic rhinitis 09/26/2009  . CHEST PAIN, ATYPICAL 05/04/2008  . Abdominal pain, epigastric 09/26/2009  . Arthralgia of multiple sites 08/13/2010  . Hyperlipidemia 10/08/2010  . Fatigue 01/08/2011  . Vitamin D deficiency 07/08/2011  . Testosterone deficiency 07/09/2011  . Elevated BP 10/08/2011  . Preventative health care 08/01/2012  . Other and unspecified hyperlipidemia 01/23/2013    No past surgical history on file.  Current Outpatient Prescriptions  Medication Sig Dispense Refill  . aspirin EC 81 MG tablet Take 81 mg by mouth daily.      Marland Kitchen CALCIUM-MAGNESIUM-ZINC PO Take 3 tablets by mouth daily.      . Coenzyme Q10 (CO Q-10) 100 MG CAPS Take by mouth daily.      . Cyanocobalamin (VITAMIN  B-12) 1000 MCG SUBL Place 1 tablet (1,000 mcg total) under the tongue every morning.      . cyclobenzaprine (FLEXERIL) 10 MG tablet Take 1 tablet (10 mg total) by mouth 3 (three) times daily as needed for muscle spasms.  30 tablet  2  . Krill Oil CAPS 1 cap po daily      . niacin 500 MG CR capsule Take 4 capsules (2,000 mg total) by mouth at bedtime. Eat a lowfat snack and take a 81 mg Aspirin EC 1/2 before  30 capsule  3  . Red Yeast Rice Extract (RED YEAST RICE PO) Take by mouth daily.      Marland Kitchen testosterone cypionate (DEPOTESTOTERONE CYPIONATE) 200 MG/ML injection inject 0.5 mls into the muscle every 14 days  10 mL  0   No current facility-administered medications for this visit.    No Known Allergies  History   Social History  . Marital Status: Married    Spouse Name: N/A    Number of Children: N/A  . Years of Education: N/A   Occupational History  . Not on file.   Social History Main Topics  . Smoking status: Never Smoker   . Smokeless tobacco: Never Used  . Alcohol Use: 0.0 oz/week    1-2 drink(s) per week     Comment: 1-2 per week  . Drug Use: No  . Sexual  Activity: Yes     Comment: lives wife and children, exercises intermittently, heart healthy diet.   Other Topics Concern  . Not on file   Social History Narrative  . No narrative on file    Family History  Problem Relation Age of Onset  . Diabetes Mother   . Hypertension Mother   . Hyperlipidemia Mother   . Diabetes Maternal Grandmother   . Heart disease Maternal Grandmother   . Colitis Maternal Grandmother   . Diabetes Paternal Grandmother   . Irritable bowel syndrome    . Cancer      Esophageal- [employer]  . Stomach cancer      Uncle  . Colon polyps      Grandmother  . Colitis Maternal Grandfather   . Heart disease Maternal Grandfather   . Heart attack Father 39  . Diabetes Other     Family history of  . Hypertension Other     Family history of  . Heart attack Other     Family history of   . Irritable bowel syndrome Other     Family history of  . Lupus Cousin     Review of Systems:  As stated in the HPI and otherwise negative.   BP 118/100  Pulse 76  Ht 5\' 8"  (1.727 m)  Wt 217 lb (98.431 kg)  BMI 33.00 kg/m2  Physical Examination: General: Well developed, well nourished, NAD HEENT: OP clear, mucus membranes moist SKIN: warm, dry. No rashes. Neuro: No focal deficits Musculoskeletal: Muscle strength 5/5 all ext Psychiatric: Mood and affect normal Neck: No JVD, no carotid bruits, no thyromegaly, no lymphadenopathy. Lungs:Clear bilaterally, no wheezes, rhonci, crackles Cardiovascular: Regular rate and rhythm. No murmurs, gallops or rubs. Abdomen:Soft. Bowel sounds present. Non-tender.  Extremities: No lower extremity edema. Pulses are 2 + in the bilateral DP/PT.  Echo 10/19/13: Left ventricle: The cavity size was normal. Systolic function was normal. The estimated ejection fraction was in the range of 55% to 60%. Wall motion was normal; there were no regional wall motion abnormalities. Left ventricular diastolic function parameters were normal.  EKG: NSR, rate 76 bpm  Assessment and Plan:   1. Chest pain: He is known to have normal coronary arteries by cath  2011. I suspect his chest pain is not cardiac related. Echo recently normal with no wall motion abnormalities. Will arrange exercise treadmill stress test to exclude ischemia.   2. HTN: BP has been well controlled at home

## 2013-12-22 NOTE — Patient Instructions (Signed)
Your physician recommends that you schedule a follow-up appointment as needed with Dr. Angelena Form.   Your physician has requested that you have an exercise tolerance test. For further information please visit [email or web address]. Please also follow instruction sheet, as given.

## 2014-01-03 ENCOUNTER — Telehealth: Payer: Self-pay | Admitting: Family Medicine

## 2014-01-03 ENCOUNTER — Ambulatory Visit (HOSPITAL_BASED_OUTPATIENT_CLINIC_OR_DEPARTMENT_OTHER)
Admission: RE | Admit: 2014-01-03 | Discharge: 2014-01-03 | Disposition: A | Discharge status: Home / Self Care | Payer: 59 | Source: Ambulatory Visit | Attending: Family Medicine | Admitting: Family Medicine

## 2014-01-03 ENCOUNTER — Encounter: Payer: Self-pay | Admitting: Family Medicine

## 2014-01-03 ENCOUNTER — Ambulatory Visit (INDEPENDENT_AMBULATORY_CARE_PROVIDER_SITE_OTHER): Payer: 59 | Admitting: Family Medicine

## 2014-01-03 VITALS — BP 140/72 | HR 92 | Temp 99.6°F | Ht 68.0 in | Wt 216.2 lb

## 2014-01-03 DIAGNOSIS — IMO0001 Reserved for inherently not codable concepts without codable children: Secondary | ICD-10-CM

## 2014-01-03 DIAGNOSIS — J189 Pneumonia, unspecified organism: Secondary | ICD-10-CM

## 2014-01-03 DIAGNOSIS — R059 Cough, unspecified: Secondary | ICD-10-CM

## 2014-01-03 DIAGNOSIS — J9811 Atelectasis: Secondary | ICD-10-CM | POA: Diagnosis not present

## 2014-01-03 DIAGNOSIS — R05 Cough: Secondary | ICD-10-CM

## 2014-01-03 DIAGNOSIS — R030 Elevated blood-pressure reading, without diagnosis of hypertension: Secondary | ICD-10-CM

## 2014-01-03 DIAGNOSIS — R509 Fever, unspecified: Secondary | ICD-10-CM

## 2014-01-03 MED ORDER — METHYLPREDNISOLONE (PAK) 4 MG PO TABS: ORAL_TABLET | ORAL | Status: DC

## 2014-01-03 MED ORDER — HYDROCODONE-HOMATROPINE 5-1.5 MG/5ML PO SYRP: 5.0000 mL | ORAL_SOLUTION | Freq: Three times a day (TID) | ORAL | Status: DC | PRN

## 2014-01-03 MED ORDER — CEFDINIR 300 MG PO CAPS: 300.0000 mg | ORAL_CAPSULE | Freq: Two times a day (BID) | ORAL | Status: AC

## 2014-01-03 NOTE — Telephone Encounter (Signed)
Appointment scheduled.

## 2014-01-03 NOTE — Progress Notes (Signed)
Pre visit review using our clinic review tool, if applicable. No additional management support is needed unless otherwise documented below in the visit note. 

## 2014-01-03 NOTE — Telephone Encounter (Signed)
Yes pt needs to schedule an appt. I believe Einar Pheasant is doc of the day.  please call and schedule

## 2014-01-03 NOTE — Patient Instructions (Addendum)
Encouraged increased rest and hydration, add probiotics, zinc such as Coldeze or Xicam. Treat fevers as needed, Mucinex twice daily x 10 days and Elderberry liquid with Echinacea, [email or web address], [place].   Acute Bronchitis Bronchitis is inflammation of the airways that extend from the windpipe into the lungs (bronchi). The inflammation often causes mucus to develop. This leads to a cough, which is the most common symptom of bronchitis.  In acute bronchitis, the condition usually develops suddenly and goes away over time, usually in a couple weeks. Smoking, allergies, and asthma can make bronchitis worse. Repeated episodes of bronchitis may cause further lung problems.  CAUSES Acute bronchitis is most often caused by the same virus that causes a cold. The virus can spread from person to person (contagious) through coughing, sneezing, and touching contaminated objects. SIGNS AND SYMPTOMS   Cough.   Fever.   Coughing up mucus.   Body aches.   Chest congestion.   Chills.   Shortness of breath.   Sore throat.  DIAGNOSIS  Acute bronchitis is usually diagnosed through a physical exam. Your health care provider will also ask you questions about your medical history. Tests, such as chest X-rays, are sometimes done to rule out other conditions.  TREATMENT  Acute bronchitis usually goes away in a couple weeks. Oftentimes, no medical treatment is necessary. Medicines are sometimes given for relief of fever or cough. Antibiotic medicines are usually not needed but may be prescribed in certain situations. In some cases, an inhaler may be recommended to help reduce shortness of breath and control the cough. A cool mist vaporizer may also be used to help thin bronchial secretions and make it easier to clear the chest.  HOME CARE INSTRUCTIONS  Get plenty of rest.   Drink enough fluids to keep your urine clear or pale yellow (unless you have a medical condition that requires fluid  restriction). Increasing fluids may help thin your respiratory secretions (sputum) and reduce chest congestion, and it will prevent dehydration.   Take medicines only as directed by your health care provider.  If you were prescribed an antibiotic medicine, finish it all even if you start to feel better.  Avoid smoking and secondhand smoke. Exposure to cigarette smoke or irritating chemicals will make bronchitis worse. If you are a smoker, consider using nicotine gum or skin patches to help control withdrawal symptoms. Quitting smoking will help your lungs heal faster.   Reduce the chances of another bout of acute bronchitis by washing your hands frequently, avoiding people with cold symptoms, and trying not to touch your hands to your mouth, nose, or eyes.   Keep all follow-up visits as directed by your health care provider.  SEEK MEDICAL CARE IF: Your symptoms do not improve after 1 week of treatment.  SEEK IMMEDIATE MEDICAL CARE IF:  You develop an increased fever or chills.   You have chest pain.   You have severe shortness of breath.  You have bloody sputum.   You develop dehydration.  You faint or repeatedly feel like you are going to pass out.  You develop repeated vomiting.  You develop a severe headache. MAKE SURE YOU:   Understand these instructions.  Will watch your condition.  Will get help right away if you are not doing well or get worse. Document Released: 03/27/2004 Document Revised: 07/04/2013 Document Reviewed: 08/10/2012 [facility] Patient Information 2015 [place], [place]. This information is not intended to replace advice given to you by your health care provider. Make sure you discuss  any questions you have with your health care provider.  

## 2014-01-03 NOTE — Telephone Encounter (Signed)
Caller name:Ignatowski, Libero E Relation to pt: self  Call back number: [phone]   Reason for call:   pt was seen at [facility] 01/02/14, clinic advised pt he needs a chest x ray and pt is in need of clinical advice unsure if he needs to schedule an appointment with you first.

## 2014-01-09 ENCOUNTER — Encounter: Payer: Self-pay | Admitting: Family Medicine

## 2014-01-09 DIAGNOSIS — J189 Pneumonia, unspecified organism: Secondary | ICD-10-CM | POA: Insufficient documentation

## 2014-01-09 NOTE — Assessment & Plan Note (Signed)
Early seen on CXR, started on antibiotics, Encouraged increased rest and hydration, add probiotics, zinc such as Coldeze or Xicam. Treat fevers as needed

## 2014-01-09 NOTE — Assessment & Plan Note (Signed)
Due to acute illness. Minimize caffeine and sodium and monitor

## 2014-01-09 NOTE — Progress Notes (Signed)
Patient ID: Casey Miles, male   DOB: February 21, 1966, 48 y.o.   MRN: [id number] Casey Miles [id number] 1965-08-26 01/09/2014      Progress Note-Follow Up  Subjective  Chief Complaint  Chief Complaint  Patient presents with  . chest xray    HPI  Patient is a 48 year old male in today for routine medical care. In today with a weeks worth of worsening illness. Several of his family members have had uri symptoms but never the fever and chills he is having. He is noting HA, chest and head congestion, body aches, anorexia and sore throat. Cough is causing chest pain. No nausea, vomiting or diarrhea. Denies palp/SOB/HA/GI or GU c/o. Taking meds as prescribed  Past Medical History  Diagnosis Date  . Kidney stone   . GERD (gastroesophageal reflux disease)   . Chest wall pain   . SHOULDER PAIN, LEFT 07/17/2008  . PARESTHESIA 11/22/2009  . Other acute reactions to stress 09/26/2009  . NECK PAIN 09/26/2009  . 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 COSTOCHONDRITIS, RECURRENT 05/06/2010  . Chronic rhinitis 09/26/2009  . CHEST PAIN, ATYPICAL 05/04/2008  . Abdominal pain, epigastric 09/26/2009  . Arthralgia of multiple sites 08/13/2010  . Hyperlipidemia 10/08/2010  . Fatigue 01/08/2011  . Vitamin D deficiency 07/08/2011  . Testosterone deficiency 07/09/2011  . Elevated BP 10/08/2011  . Preventative health care 08/01/2012  . Other and unspecified hyperlipidemia 01/23/2013    No past surgical history on file.  Family History  Problem Relation Age of Onset  . Diabetes Mother   . Hypertension Mother   . Hyperlipidemia Mother   . Diabetes Maternal Grandmother   . Heart disease Maternal Grandmother   . Colitis Maternal Grandmother   . Diabetes Paternal Grandmother   . Irritable bowel syndrome    . Cancer      Esophageal- [employer]  . Stomach cancer      Uncle  . Colon polyps      Grandmother  . Colitis Maternal Grandfather   . Heart disease Maternal Grandfather   . Heart attack  Father 50  . Diabetes Other     Family history of  . Hypertension Other     Family history of  . Heart attack Other     Family history of  . Irritable bowel syndrome Other     Family history of  . Lupus Cousin     History   Social History  . Marital Status: Married    Spouse Name: N/A    Number of Children: N/A  . Years of Education: N/A   Occupational History  . Not on file.   Social History Main Topics  . Smoking status: Never Smoker   . Smokeless tobacco: Never Used  . Alcohol Use: 0.0 oz/week    1-2 drink(s) per week     Comment: 1-2 per week  . Drug Use: No  . Sexual Activity: Yes     Comment: lives wife and children, exercises intermittently, heart healthy diet.   Other Topics Concern  . Not on file   Social History Narrative    Current Outpatient Prescriptions on File Prior to Visit  Medication Sig Dispense Refill  . aspirin EC 81 MG tablet Take 81 mg by mouth daily.    Marland Kitchen CALCIUM-MAGNESIUM-ZINC PO Take 3 tablets by mouth daily.    . Coenzyme Q10 (CO Q-10) 100 MG CAPS Take by mouth daily.    . Cyanocobalamin (VITAMIN B-12) 1000 MCG SUBL  Place 1 tablet (1,000 mcg total) under the tongue every morning.    . cyclobenzaprine (FLEXERIL) 10 MG tablet Take 1 tablet (10 mg total) by mouth 3 (three) times daily as needed for muscle spasms. 30 tablet 2  . Krill Oil CAPS 1 cap po daily    . niacin 500 MG CR capsule Take 4 capsules (2,000 mg total) by mouth at bedtime. Eat a lowfat snack and take a 81 mg Aspirin EC 1/2 before 30 capsule 3  . Red Yeast Rice Extract (RED YEAST RICE PO) Take by mouth daily.    Marland Kitchen testosterone cypionate (DEPOTESTOTERONE CYPIONATE) 200 MG/ML injection inject 0.5 mls into the muscle every 14 days 10 mL 0   No current facility-administered medications on file prior to visit.    No Known Allergies  Review of Systems  Review of Systems  Constitutional: Positive for fever, chills and malaise/fatigue.  HENT: Positive for congestion.   Eyes:  Negative for discharge.  Respiratory: Positive for cough, sputum production and shortness of breath.   Cardiovascular: Negative for chest pain, palpitations and leg swelling.  Gastrointestinal: Negative for nausea, abdominal pain and diarrhea.  Genitourinary: Negative for dysuria.  Musculoskeletal: Positive for myalgias. Negative for falls.  Skin: Negative for rash.  Neurological: Positive for headaches. Negative for loss of consciousness.  Endo/Heme/Allergies: Negative for polydipsia.  Psychiatric/Behavioral: Negative for depression and suicidal ideas. The patient is not nervous/anxious and does not have insomnia.     Objective  BP 140/72 mmHg  Pulse 92  Temp(Src) 99.6 F (37.6 C) (Oral)  Ht 5\' 8"  (1.727 m)  Wt 216 lb 3.2 oz (98.068 kg)  BMI 32.88 kg/m2  SpO2 98%  Physical Exam  Physical Exam  Constitutional: He is oriented to person, place, and time and well-developed, well-nourished, and in no distress. No distress.  HENT:  Head: Normocephalic and atraumatic.  Eyes: Conjunctivae are normal.  Neck: Neck supple. No thyromegaly present.  Cardiovascular: Normal rate, regular rhythm and normal heart sounds.   No murmur heard. Pulmonary/Chest: Effort normal. No respiratory distress. He has rales.  rll  Abdominal: He exhibits no distension and no mass. There is no tenderness.  Musculoskeletal: He exhibits no edema.  Neurological: He is alert and oriented to person, place, and time.  Skin: Skin is warm.  Psychiatric: Memory, affect and judgment normal.    Lab Results  Component Value Date   TSH 0.782 10/07/2013   Lab Results  Component Value Date   WBC 5.4 10/07/2013   HGB 15.0 10/07/2013   HCT 41.8 10/07/2013   MCV 87.3 10/07/2013   PLT 207 10/07/2013   Lab Results  Component Value Date   CREATININE 1.25 10/07/2013   BUN 16 10/07/2013   NA 138 10/07/2013   K 4.1 10/07/2013   CL 101 10/07/2013   CO2 28 10/07/2013   Lab Results  Component Value Date   ALT 16  10/07/2013   AST 22 10/07/2013   ALKPHOS 44 10/07/2013   BILITOT 1.3* 10/07/2013   Lab Results  Component Value Date   CHOL 148 10/07/2013   Lab Results  Component Value Date   HDL 37* 10/07/2013   Lab Results  Component Value Date   LDLCALC 73 10/07/2013   Lab Results  Component Value Date   TRIG 192* 10/07/2013   Lab Results  Component Value Date   CHOLHDL 4.0 10/07/2013     Assessment & Plan  Elevated BP Due to acute illness. Minimize caffeine and sodium and monitor  CAP (community acquired pneumonia) Early seen on CXR, started on antibiotics, Encouraged increased rest and hydration, add probiotics, zinc such as Coldeze or Xicam. Treat fevers as needed

## 2014-01-24 ENCOUNTER — Encounter: Payer: 59 | Admitting: Nurse Practitioner

## 2014-01-24 ENCOUNTER — Ambulatory Visit (INDEPENDENT_AMBULATORY_CARE_PROVIDER_SITE_OTHER): Payer: 59 | Admitting: Nurse Practitioner

## 2014-01-24 ENCOUNTER — Encounter: Payer: Self-pay | Admitting: Nurse Practitioner

## 2014-01-24 VITALS — BP 153/96 | HR 113

## 2014-01-24 DIAGNOSIS — R079 Chest pain, unspecified: Secondary | ICD-10-CM

## 2014-01-24 NOTE — Progress Notes (Signed)
Exercise Treadmill Test  Pre-Exercise Testing Evaluation Rhythm: normal sinus  Rate: 96 bpm     Test  Exercise Tolerance Test Ordering MD: Murvin Natal, MD  Interpreting MD: Truitt Merle, NP  Unique Test No: 1  Treadmill:  1  Indication for ETT: chest pain - rule out ischemia  Contraindication to ETT: No   Stress Modality: exercise - treadmill  Cardiac Imaging Performed: non   Protocol: standard Bruce - maximal  Max BP:  173/68  Max MPHR (bpm):  174 85% MPR (bpm):  148  MPHR obtained (bpm):  169 % MPHR obtained:  95%  Reached 85% MPHR (min:sec):  6:11 Total Exercise Time (min-sec):  9 minutes  Workload in METS:  10.1 Borg Scale: 15  Reason ETT Terminated:  patient's desire to stop    ST Segment Analysis At Rest: normal ST segments - no evidence of significant ST depression With Exercise: no evidence of significant ST depression  Other Information Arrhythmia:  No Angina during ETT:  absent (0) Quality of ETT:  diagnostic  ETT Interpretation:  normal - no evidence of ischemia by ST analysis  Comments: Patient presents today for routine GXT. Has had atypical chest pain - prior cath in 2012 without significant CAD noted.    Today the patient exercised on the standard Bruce protocol for a total of 9 minutes.  Good exercise tolerance.  Adequate blood pressure response.  Clinically negative for chest pain. Test was stopped due to achievement of target HR.  EKG negative for ischemia. No significant arrhythmia noted.   Recommendations: CV risk factor modification  See back prn  Call the [place] office at [phone] if you have any questions, problems or concerns.   Burtis Junes, RN, [facility] [address] [place], [place]  [address] [phone]

## 2014-03-02 ENCOUNTER — Ambulatory Visit (HOSPITAL_BASED_OUTPATIENT_CLINIC_OR_DEPARTMENT_OTHER)
Admission: RE | Admit: 2014-03-02 | Discharge: 2014-03-02 | Disposition: A | Discharge status: Home / Self Care | Payer: 59 | Source: Ambulatory Visit | Attending: Medical | Admitting: Medical

## 2014-03-02 ENCOUNTER — Ambulatory Visit (INDEPENDENT_AMBULATORY_CARE_PROVIDER_SITE_OTHER): Payer: 59 | Admitting: Medical

## 2014-03-02 ENCOUNTER — Encounter: Payer: Self-pay | Admitting: Medical

## 2014-03-02 VITALS — BP 134/91 | HR 84 | Temp 98.5°F | Ht 68.0 in | Wt 220.2 lb

## 2014-03-02 DIAGNOSIS — R05 Cough: Secondary | ICD-10-CM | POA: Diagnosis not present

## 2014-03-02 DIAGNOSIS — R059 Cough, unspecified: Secondary | ICD-10-CM

## 2014-03-02 DIAGNOSIS — R062 Wheezing: Secondary | ICD-10-CM | POA: Insufficient documentation

## 2014-03-02 DIAGNOSIS — J209 Acute bronchitis, unspecified: Secondary | ICD-10-CM | POA: Insufficient documentation

## 2014-03-02 MED ORDER — BECLOMETHASONE DIPROPIONATE 40 MCG/ACT IN AERS: 2.0000 | INHALATION_SPRAY | Freq: Two times a day (BID) | RESPIRATORY_TRACT | Status: DC

## 2014-03-02 MED ORDER — ALBUTEROL SULFATE HFA 108 (90 BASE) MCG/ACT IN AERS: 2.0000 | INHALATION_SPRAY | Freq: Four times a day (QID) | RESPIRATORY_TRACT | Status: DC | PRN

## 2014-03-02 MED ORDER — AMOXICILLIN-POT CLAVULANATE 875-125 MG PO TABS: 1.0000 | ORAL_TABLET | Freq: Two times a day (BID) | ORAL | Status: DC

## 2014-03-02 NOTE — Assessment & Plan Note (Signed)
You appear to have bronchitis with wheezing. Rest hydrate and tylenol for fever. I am prescribing augmentin antibiotic. For your nasal congestion you could use otc the  flonase nasal steroid. For your cough keep using your hycodan prescribed in November.  For your wheezing I am prescribing qvar and albuterol inhalers. By Monday you wheezing should improve significanlty. If not then next step would be to consider tapered oral prednisone.   Please get  chest xray today.

## 2014-03-02 NOTE — Progress Notes (Signed)
Pre visit review using our clinic review tool, if applicable. No additional management support is needed unless otherwise documented below in the visit note. 

## 2014-03-02 NOTE — Progress Notes (Signed)
Subjective:    Patient ID: Casey Miles, male    DOB: [birth year], 48 y.o.   MRN: [id number]  HPI   Pt in today reporting  cough, nasal congestion and runny nose for 10  Days. Since around christmas just felt mild congestion with above with progressive worsening symptoms. Some wheezing at night.   November had bronchtits and got over illness.  He is not a smoker and no asthma.  Associated symptoms( below yes or no)  Fever-no Chills-no Chest congestion-yes Sneezing- no Itching eyes-no Sore throat- yes, mild. Post-nasal drainage-yes Wheezing-yes Purulent drainage-NO [place]  Past Medical History  Diagnosis Date  . Kidney stone   . GERD (gastroesophageal reflux disease)   . Chest wall pain   . SHOULDER PAIN, LEFT 07/17/2008  . PARESTHESIA 11/22/2009  . Other acute reactions to stress 09/26/2009  . NECK PAIN 09/26/2009  . 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 COSTOCHONDRITIS, RECURRENT 05/06/2010  . Chronic rhinitis 09/26/2009  . CHEST PAIN, ATYPICAL 05/04/2008  . Abdominal pain, epigastric 09/26/2009  . Arthralgia of multiple sites 08/13/2010  . Hyperlipidemia 10/08/2010  . Fatigue 01/08/2011  . Vitamin D deficiency 07/08/2011  . Testosterone deficiency 07/09/2011  . Elevated BP 10/08/2011  . Preventative health care 08/01/2012  . Other and unspecified hyperlipidemia 01/23/2013    History   Social History  . Marital Status: Married    Spouse Name: N/A    Number of Children: N/A  . Years of Education: N/A   Occupational History  . Not on file.   Social History Main Topics  . Smoking status: Never Smoker   . Smokeless tobacco: Never Used  . Alcohol Use: 0.0 oz/week    1-2 drink(s) per week     Comment: 1-2 per week  . Drug Use: No  . Sexual Activity: Yes     Comment: lives wife and children, exercises intermittently, heart healthy diet.   Other Topics Concern  . Not on file   Social History Narrative    No past surgical history on  file.  Family History  Problem Relation Age of Onset  . Diabetes Mother   . Hypertension Mother   . Hyperlipidemia Mother   . Diabetes Maternal Grandmother   . Heart disease Maternal Grandmother   . Colitis Maternal Grandmother   . Diabetes Paternal Grandmother   . Irritable bowel syndrome    . Cancer      Esophageal- [employer]  . Stomach cancer      Uncle  . Colon polyps      Grandmother  . Colitis Maternal Grandfather   . Heart disease Maternal Grandfather   . Heart attack Father 61  . Diabetes Other     Family history of  . Hypertension Other     Family history of  . Heart attack Other     Family history of  . Irritable bowel syndrome Other     Family history of  . Lupus Cousin     No Known Allergies  Current Outpatient Prescriptions on File Prior to Visit  Medication Sig Dispense Refill  . aspirin EC 81 MG tablet Take 81 mg by mouth daily.    Marland Kitchen CALCIUM-MAGNESIUM-ZINC PO Take 3 tablets by mouth daily.    . Coenzyme Q10 (CO Q-10) 100 MG CAPS Take by mouth daily.    . Cyanocobalamin (VITAMIN B-12) 1000 MCG SUBL Place 1 tablet (1,000 mcg total) under the tongue every morning.    . cyclobenzaprine (FLEXERIL) 10  MG tablet Take 1 tablet (10 mg total) by mouth 3 (three) times daily as needed for muscle spasms. 30 tablet 2  . HYDROcodone-homatropine (HYCODAN) 5-1.5 MG/5ML syrup Take 5 mLs by mouth every 8 (eight) hours as needed for cough. 120 mL 0  . Krill Oil CAPS 1 cap po daily    . Red Yeast Rice Extract (RED YEAST RICE PO) Take by mouth daily.    Marland Kitchen testosterone cypionate (DEPOTESTOTERONE CYPIONATE) 200 MG/ML injection inject 0.5 mls into the muscle every 14 days 10 mL 0   No current facility-administered medications on file prior to visit.    BP 134/91 mmHg  Pulse 84  Temp(Src) 98.5 F (36.9 C) (Oral)  Ht 5\' 8"  (1.727 m)  Wt 220 lb 3.2 oz (99.882 kg)  BMI 33.49 kg/m2  SpO2 98%       Review of Systems  Constitutional: Positive for fatigue.  Negative for fever and chills.  HENT: Positive for congestion, postnasal drip, rhinorrhea and sore throat. Negative for sinus pressure.   Respiratory: Positive for cough and wheezing.        Chest congestion  Cardiovascular: Negative for chest pain and palpitations.  Musculoskeletal: Negative for neck pain.  Neurological: Negative for dizziness and headaches.  Hematological: Negative for adenopathy. Does not bruise/bleed easily.       Objective:   Physical Exam  General  Mental Status - Alert. General Appearance - Well groomed. Not in acute distress.  Skin Rashes- No Rashes.  HEENT Head- Normal. Ear Auditory Canal - Left- Normal. Right - Normal.Tympanic Membrane- Left- Normal. Right- Normal. Eye Sclera/Conjunctiva- Left- Normal. Right- Normal. Nose & Sinuses Nasal Mucosa- Left-  Not boggy or Congested. Right-  Not  boggy or Congested. Mouth & Throat Lips: Upper Lip- Normal: no dryness, cracking, pallor, cyanosis, or vesicular eruption. Lower Lip-Normal: no dryness, cracking, pallor, cyanosis or vesicular eruption. Buccal Mucosa- Bilateral- No Aphthous ulcers. Oropharynx- No Discharge or Erythema. Tonsils: Characteristics- Bilateral- No Erythema or Congestion. Size/Enlargement- Bilateral- No enlargement. Discharge- bilateral-None.  Neck Neck- Supple. No Masses.   Chest and Lung Exam Auscultation: Breath Sounds:- even and unlabored, but scattered rhonchi with some expiratory wheezing  Cardiovascular Auscultation:Rythm- Regular, rate and rhythm. Murmurs & Other Heart Sounds:Ausculatation of the heart reveal- No Murmurs.  Lymphatic Head & Neck General Head & Neck Lymphatics: Bilateral: Description- No Localized lymphadenopathy.        Assessment & Plan:

## 2014-03-02 NOTE — Patient Instructions (Signed)
You appear to have bronchitis with wheezing. Rest hydrate and tylenol for fever. I am prescribing augmentin antibiotic. For your nasal congestion you could use otc the  flonase nasal steroid. For your cough keep using your hycodan prescribed in November.  For your wheezing I am prescribing qvar and albuterol inhalers. By Monday you wheezing should improve significanlty. If not then next step would be to consider tapered oral prednisone.   Please get  chest xray today.  Follow up in 7-10 days or as needed

## 2014-03-08 ENCOUNTER — Telehealth: Payer: Self-pay | Admitting: Family Medicine

## 2014-03-08 NOTE — Telephone Encounter (Signed)
Caller name: Styles, Fambro Relation to pt: self  Call back number: [phone] Pharmacy: [facility] [phone]  Reason for call:  Pt returning your call and states he does not feel better requesting steroids RX please send to [facility], [place] - [address] *pt would like to speak with you

## 2014-03-09 NOTE — Telephone Encounter (Signed)
Returned patients call. Asked to call back regarding symptoms.

## 2014-03-09 NOTE — Telephone Encounter (Signed)
Pt called earlier today I was busy and part of the day not in the office. I called back left a message asking him to call us back tomorrow on Friday. Depending on his symptoms explained it is possible that we could call in prednisone rx. Depends if he only has cough, wheezing or if he has other signs or symptoms.

## 2014-03-09 NOTE — Telephone Encounter (Signed)
Pt returned your call and pt states he's coughing & wheezing. Please call mobile [phone]

## 2014-03-10 ENCOUNTER — Telehealth: Payer: Self-pay | Admitting: Family Medicine

## 2014-03-10 NOTE — Telephone Encounter (Signed)
Called patient on 03/06/14 regarding results. Information given to wife. Patient may have been calling back from previous call.

## 2014-03-10 NOTE — Telephone Encounter (Signed)
Pt returning your call

## 2014-04-06 ENCOUNTER — Encounter: Payer: Self-pay | Admitting: Family Medicine

## 2014-04-17 ENCOUNTER — Ambulatory Visit: Payer: 59 | Admitting: Family Medicine

## 2014-05-22 ENCOUNTER — Ambulatory Visit: Payer: 59 | Admitting: Family Medicine

## 2014-05-22 ENCOUNTER — Other Ambulatory Visit: Payer: Self-pay | Admitting: Family Medicine

## 2014-05-22 NOTE — Telephone Encounter (Signed)
Please advise Testosterone Rx request.  Last testosterone level 10/2013. Pt seen 01/2014 by PCP for pneumonia and has no future appts scheduled. Please advise Rx?

## 2014-05-22 NOTE — Telephone Encounter (Signed)
He needs a testosterone level checked before he can have a refill. Please arrange.

## 2014-05-23 ENCOUNTER — Ambulatory Visit (INDEPENDENT_AMBULATORY_CARE_PROVIDER_SITE_OTHER): Payer: 59 | Admitting: Family Medicine

## 2014-05-23 ENCOUNTER — Encounter: Payer: Self-pay | Admitting: Family Medicine

## 2014-05-23 VITALS — BP 135/87 | HR 88 | Temp 98.7°F | Resp 16 | Ht 68.0 in | Wt 207.6 lb

## 2014-05-23 DIAGNOSIS — E559 Vitamin D deficiency, unspecified: Secondary | ICD-10-CM

## 2014-05-23 DIAGNOSIS — E291 Testicular hypofunction: Secondary | ICD-10-CM

## 2014-05-23 DIAGNOSIS — R030 Elevated blood-pressure reading, without diagnosis of hypertension: Secondary | ICD-10-CM

## 2014-05-23 DIAGNOSIS — E785 Hyperlipidemia, unspecified: Secondary | ICD-10-CM

## 2014-05-23 DIAGNOSIS — R197 Diarrhea, unspecified: Secondary | ICD-10-CM

## 2014-05-23 DIAGNOSIS — IMO0001 Reserved for inherently not codable concepts without codable children: Secondary | ICD-10-CM

## 2014-05-23 DIAGNOSIS — M940 Chondrocostal junction syndrome [Tietze]: Secondary | ICD-10-CM

## 2014-05-23 DIAGNOSIS — E349 Endocrine disorder, unspecified: Secondary | ICD-10-CM

## 2014-05-23 MED ORDER — TESTOSTERONE CYPIONATE 200 MG/ML IM SOLN: 100.0000 mg | INTRAMUSCULAR | Status: DC

## 2014-05-23 NOTE — Patient Instructions (Signed)

## 2014-05-23 NOTE — Telephone Encounter (Signed)
Called pt and lmovm to return call to inform

## 2014-05-23 NOTE — Progress Notes (Signed)
Pre visit review using our clinic review tool, if applicable. No additional management support is needed unless otherwise documented below in the visit note. 

## 2014-05-24 ENCOUNTER — Other Ambulatory Visit (INDEPENDENT_AMBULATORY_CARE_PROVIDER_SITE_OTHER): Payer: 59

## 2014-05-24 DIAGNOSIS — E291 Testicular hypofunction: Secondary | ICD-10-CM

## 2014-05-24 DIAGNOSIS — R197 Diarrhea, unspecified: Secondary | ICD-10-CM | POA: Diagnosis not present

## 2014-05-24 DIAGNOSIS — E785 Hyperlipidemia, unspecified: Secondary | ICD-10-CM | POA: Diagnosis not present

## 2014-05-24 DIAGNOSIS — R030 Elevated blood-pressure reading, without diagnosis of hypertension: Secondary | ICD-10-CM | POA: Diagnosis not present

## 2014-05-24 DIAGNOSIS — IMO0001 Reserved for inherently not codable concepts without codable children: Secondary | ICD-10-CM

## 2014-05-24 DIAGNOSIS — E349 Endocrine disorder, unspecified: Secondary | ICD-10-CM

## 2014-05-24 DIAGNOSIS — E559 Vitamin D deficiency, unspecified: Secondary | ICD-10-CM

## 2014-05-24 LAB — COMPREHENSIVE METABOLIC PANEL
ALK PHOS: 69 U/L (ref 39–117)
ALT: 18 U/L (ref 0–53)
AST: 23 U/L (ref 0–37)
Albumin: 3.9 g/dL (ref 3.5–5.2)
BUN: 13 mg/dL (ref 6–23)
CALCIUM: 9.1 mg/dL (ref 8.4–10.5)
CHLORIDE: 103 meq/L (ref 96–112)
CO2: 30 mEq/L (ref 19–32)
Creatinine, Ser: 1.29 mg/dL (ref 0.40–1.50)
GFR: 63.5 mL/min (ref >60.00)
GLUCOSE: 91 mg/dL (ref 70–99)
Potassium: 4 mEq/L (ref 3.5–5.1)
SODIUM: 138 meq/L (ref 135–145)
Total Bilirubin: 0.7 mg/dL (ref 0.2–1.2)
Total Protein: 6.7 g/dL (ref 6.0–8.3)

## 2014-05-24 LAB — LIPID PANEL
CHOL/HDL RATIO: 4
Cholesterol: 155 mg/dL (ref 0–200)
HDL: 35.2 mg/dL — AB (ref >39.00)
LDL Cholesterol: 100 mg/dL — ABNORMAL HIGH (ref 0–99)
NONHDL: 119.8
TRIGLYCERIDES: 98 mg/dL (ref 0.0–149.0)
VLDL: 19.6 mg/dL (ref 0.0–40.0)

## 2014-05-24 LAB — CBC
HEMATOCRIT: 45.1 % (ref 39.0–52.0)
HEMOGLOBIN: 15.6 g/dL (ref 13.0–17.0)
MCHC: 34.5 g/dL (ref 30.0–36.0)
MCV: 91.4 fl (ref 78.0–100.0)
Platelets: 213 10*3/uL (ref 150.0–400.0)
RBC: 4.94 Mil/uL (ref 4.22–5.81)
RDW: 13.1 % (ref 11.5–15.5)
WBC: 8.8 10*3/uL (ref 4.0–10.5)

## 2014-05-24 LAB — LIPASE: Lipase: 22 U/L (ref 11.0–59.0)

## 2014-05-24 LAB — TSH: TSH: 0.6 u[IU]/mL (ref 0.35–4.50)

## 2014-05-24 LAB — AMYLASE: Amylase: 47 U/L (ref 27–131)

## 2014-05-24 LAB — VITAMIN D 25 HYDROXY (VIT D DEFICIENCY, FRACTURES): VITD: 22.66 ng/mL — ABNORMAL LOW (ref 30.00–100.00)

## 2014-05-24 LAB — TESTOSTERONE: TESTOSTERONE: 566.7 ng/dL (ref 300.00–890.00)

## 2014-05-25 ENCOUNTER — Other Ambulatory Visit: Payer: Self-pay | Admitting: General Practice

## 2014-05-25 ENCOUNTER — Telehealth: Payer: Self-pay | Admitting: *Deleted

## 2014-05-25 DIAGNOSIS — E559 Vitamin D deficiency, unspecified: Secondary | ICD-10-CM

## 2014-05-25 LAB — CLOSTRIDIUM DIFFICILE BY PCR: CDIFFPCR: DETECTED — AB

## 2014-05-25 LAB — OVA AND PARASITE EXAMINATION: OP: NONE SEEN

## 2014-05-25 LAB — FECAL LACTOFERRIN, QUANT: Lactoferrin: NEGATIVE

## 2014-05-25 MED ORDER — FLUCONAZOLE 150 MG PO TABS: 150.0000 mg | ORAL_TABLET | ORAL | Status: DC

## 2014-05-25 MED ORDER — METRONIDAZOLE 500 MG PO TABS: 500.0000 mg | ORAL_TABLET | Freq: Three times a day (TID) | ORAL | Status: DC

## 2014-05-25 MED ORDER — VITAMIN D (ERGOCALCIFEROL) 1.25 MG (50000 UNIT) PO CAPS: 50000.0000 [IU] | ORAL_CAPSULE | ORAL | Status: DC

## 2014-05-25 NOTE — Telephone Encounter (Signed)
I sent a note on the result. Needs Flagyl 500 mg po tid x 14 days

## 2014-05-25 NOTE — Telephone Encounter (Signed)
Called patient on [phone] (Home) *Preferred* and left message for patient to return call regarding lab results.  eal

## 2014-05-25 NOTE — Telephone Encounter (Signed)
Patient returned call.  Notified patient of results, stated understanding.  Rx for Flagyl sent to [facility] on [address] per patient request.  Notified patient of need for frequent hand washing, patient stated understanding.

## 2014-05-25 NOTE — Telephone Encounter (Signed)
C-Diff positive by PCR

## 2014-05-28 LAB — STOOL CULTURE

## 2014-05-29 ENCOUNTER — Telehealth: Payer: Self-pay | Admitting: *Deleted

## 2014-05-29 NOTE — Telephone Encounter (Signed)
Prior authorization for testosterone cypionate approved effective 04/29/2014 - 05/29/2015. [id number]

## 2014-05-31 ENCOUNTER — Encounter: Payer: Self-pay | Admitting: Family Medicine

## 2014-06-01 ENCOUNTER — Other Ambulatory Visit: Payer: Self-pay | Admitting: Family Medicine

## 2014-06-01 MED ORDER — METRONIDAZOLE 500 MG PO TABS: 500.0000 mg | ORAL_TABLET | Freq: Three times a day (TID) | ORAL | Status: DC

## 2014-06-01 NOTE — Telephone Encounter (Signed)
Flagyl sent in as informed

## 2014-06-04 ENCOUNTER — Encounter: Payer: Self-pay | Admitting: Family Medicine

## 2014-06-04 DIAGNOSIS — R197 Diarrhea, unspecified: Secondary | ICD-10-CM | POA: Insufficient documentation

## 2014-06-04 NOTE — Assessment & Plan Note (Signed)
Level in normal range

## 2014-06-04 NOTE — Assessment & Plan Note (Signed)
Encouraged heart healthy diet, increase exercise, avoid trans fats, consider a krill oil cap daily 

## 2014-06-04 NOTE — Assessment & Plan Note (Signed)
Testing today reveals CDiff positive and moderate yeast growth. Started on Flagyl and Diflucan, encouraged probiotics and minimize starchy foods.

## 2014-06-04 NOTE — Assessment & Plan Note (Signed)
Pain persistent but stable, no new concerns

## 2014-06-04 NOTE — Assessment & Plan Note (Signed)
Well controlled. Encouraged heart healthy diet such as the DASH diet and exercise as tolerated.  

## 2014-06-04 NOTE — Assessment & Plan Note (Signed)
Start supplement at 50000 IU weekly and reassess in 12 weeks

## 2014-06-04 NOTE — Progress Notes (Signed)
Casey Miles  [id number] [birth year] 06/04/2014      Progress Note-Follow Up  Subjective  Chief Complaint  Chief Complaint  Patient presents with  . Diarrhea    with slight cramping x 10 days    HPI  Patient is a 49 y.o. male in today for routine medical care. Patient is in today complaining of 10 dayhistory of persistent diarrhea. His sons had gastroenteritis last both resolved spontaneously however his dyspnea he denies fevers or chills. Does have some nausea. No vomiting. No bloody or tarry stool. Has some abdominal cramping but no severe pain. Also notes some ongoing low back pain. His atypical chest pain and upper back pain stable but unchanged. He's had no recent antibiotic use or febrile illness. Denies palp/SOB/HA/congestion/fevers or GU c/o. Taking meds as prescribed  Past Medical History  Diagnosis Date  . Kidney stone   . GERD (gastroesophageal reflux disease)   . Chest wall pain   . SHOULDER PAIN, LEFT 07/17/2008  . PARESTHESIA 11/22/2009  . Other acute reactions to stress 09/26/2009  . NECK PAIN 09/26/2009  . 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 COSTOCHONDRITIS, RECURRENT 05/06/2010  . Chronic rhinitis 09/26/2009  . CHEST PAIN, ATYPICAL 05/04/2008  . Abdominal pain, epigastric 09/26/2009  . Arthralgia of multiple sites 08/13/2010  . Hyperlipidemia 10/08/2010  . Fatigue 01/08/2011  . Vitamin D deficiency 07/08/2011  . Testosterone deficiency 07/09/2011  . Elevated BP 10/08/2011  . Preventative health care 08/01/2012  . Other and unspecified hyperlipidemia 01/23/2013    History reviewed. No pertinent past surgical history.  Family History  Problem Relation Age of Onset  . Diabetes Mother   . Hypertension Mother   . Hyperlipidemia Mother   . Diabetes Maternal Grandmother   . Heart disease Maternal Grandmother   . Colitis Maternal Grandmother   . Diabetes Paternal Grandmother   . Irritable bowel syndrome    . Cancer      Esophageal- [employer]  .  Stomach cancer      Uncle  . Colon polyps      Grandmother  . Colitis Maternal Grandfather   . Heart disease Maternal Grandfather   . Heart attack Father 42  . Diabetes Other     Family history of  . Hypertension Other     Family history of  . Heart attack Other     Family history of  . Irritable bowel syndrome Other     Family history of  . Lupus Cousin     History   Social History  . Marital Status: Married    Spouse Name: N/A  . Number of Children: N/A  . Years of Education: N/A   Occupational History  . Not on file.   Social History Main Topics  . Smoking status: Never Smoker   . Smokeless tobacco: Never Used  . Alcohol Use: 0.0 oz/week    1-2 Standard drinks or equivalent per week     Comment: 1-2 per week  . Drug Use: No  . Sexual Activity: Yes     Comment: lives wife and children, exercises intermittently, heart healthy diet.   Other Topics Concern  . Not on file   Social History Narrative    Current Outpatient Prescriptions on File Prior to Visit  Medication Sig Dispense Refill  . albuterol (PROVENTIL HFA;VENTOLIN HFA) 108 (90 BASE) MCG/ACT inhaler Inhale 2 puffs into the lungs every 6 (six) hours as needed for wheezing or shortness of breath. 1 Inhaler 0  .  aspirin EC 81 MG tablet Take 81 mg by mouth daily.    Marland Kitchen CALCIUM-MAGNESIUM-ZINC PO Take 3 tablets by mouth daily.    . Coenzyme Q10 (CO Q-10) 100 MG CAPS Take by mouth daily.    . Cyanocobalamin (VITAMIN B-12) 1000 MCG SUBL Place 1 tablet (1,000 mcg total) under the tongue every morning.    . cyclobenzaprine (FLEXERIL) 10 MG tablet Take 1 tablet (10 mg total) by mouth 3 (three) times daily as needed for muscle spasms. 30 tablet 2  . Krill Oil CAPS 1 cap po daily    . Red Yeast Rice Extract (RED YEAST RICE PO) Take by mouth daily.    . beclomethasone (QVAR) 40 MCG/ACT inhaler Inhale 2 puffs into the lungs 2 (two) times daily. (Patient not taking: Reported on 05/23/2014) 1 Inhaler 2  .  HYDROcodone-homatropine (HYCODAN) 5-1.5 MG/5ML syrup Take 5 mLs by mouth every 8 (eight) hours as needed for cough. (Patient not taking: Reported on 05/23/2014) 120 mL 0  . testosterone cypionate (DEPOTESTOTERONE CYPIONATE) 200 MG/ML injection Inject 0.5 mLs (100 mg total) into the muscle every 14 (fourteen) days. 10 mL 0   No current facility-administered medications on file prior to visit.    No Known Allergies  Review of Systems  Review of Systems  Constitutional: Positive for malaise/fatigue. Negative for fever, chills and diaphoresis.  HENT: Negative for congestion.   Eyes: Negative for discharge.  Respiratory: Negative for shortness of breath.   Cardiovascular: Positive for chest pain. Negative for palpitations and leg swelling.  Gastrointestinal: Positive for nausea, abdominal pain and diarrhea. Negative for vomiting, constipation, blood in stool and melena.  Genitourinary: Negative for dysuria.  Musculoskeletal: Positive for back pain. Negative for falls.  Skin: Negative for rash.  Neurological: Negative for loss of consciousness and headaches.  Endo/Heme/Allergies: Negative for polydipsia.  Psychiatric/Behavioral: Negative for depression and suicidal ideas. The patient is not nervous/anxious and does not have insomnia.     Objective  BP 135/87 mmHg  Pulse 88  Temp(Src) 98.7 F (37.1 C) (Oral)  Resp 16  Ht 5\' 8"  (1.727 m)  Wt 207 lb 9.6 oz (94.167 kg)  BMI 31.57 kg/m2  SpO2 95%  Physical Exam  Physical Exam  Constitutional: He is oriented to person, place, and time and well-developed, well-nourished, and in no distress. No distress.  HENT:  Head: Normocephalic and atraumatic.  Eyes: Conjunctivae are normal.  Neck: Neck supple. No thyromegaly present.  Cardiovascular: Normal rate, regular rhythm and normal heart sounds.   No murmur heard. Pulmonary/Chest: Effort normal and breath sounds normal. No respiratory distress.  Abdominal: Soft. Bowel sounds are normal.  He exhibits no distension and no mass. There is no tenderness. There is no rebound and no guarding.  Musculoskeletal: He exhibits no edema.  Neurological: He is alert and oriented to person, place, and time.  Skin: Skin is warm.  Psychiatric: Memory, affect and judgment normal.    Lab Results  Component Value Date   TSH 0.60 05/24/2014   Lab Results  Component Value Date   WBC 8.8 05/24/2014   HGB 15.6 05/24/2014   HCT 45.1 05/24/2014   MCV 91.4 05/24/2014   PLT 213.0 05/24/2014   Lab Results  Component Value Date   CREATININE 1.29 05/24/2014   BUN 13 05/24/2014   NA 138 05/24/2014   K 4.0 05/24/2014   CL 103 05/24/2014   CO2 30 05/24/2014   Lab Results  Component Value Date   ALT 18 05/24/2014  AST 23 05/24/2014   ALKPHOS 69 05/24/2014   BILITOT 0.7 05/24/2014   Lab Results  Component Value Date   CHOL 155 05/24/2014   Lab Results  Component Value Date   HDL 35.20* 05/24/2014   Lab Results  Component Value Date   LDLCALC 100* 05/24/2014   Lab Results  Component Value Date   TRIG 98.0 05/24/2014   Lab Results  Component Value Date   CHOLHDL 4 05/24/2014     Assessment & Plan  Other and unspecified hyperlipidemia Encouraged heart healthy diet, increase exercise, avoid trans fats, consider a krill oil cap daily   Elevated BP Well controlled. Encouraged heart healthy diet such as the DASH diet and exercise as tolerated.    COSTOCHONDRITIS, RECURRENT Pain persistent but stable, no new concerns   Vitamin D deficiency Start supplement at 50000 IU weekly and reassess in 12 weeks    Testosterone deficiency Level in normal range   Diarrhea Testing today reveals CDiff positive and moderate yeast growth. Started on Flagyl and Diflucan, encouraged probiotics and minimize starchy foods.

## 2014-12-04 ENCOUNTER — Encounter: Payer: Self-pay | Admitting: Gastroenterology

## 2015-02-22 ENCOUNTER — Telehealth: Payer: Self-pay | Admitting: Family Medicine

## 2015-02-23 MED ORDER — TESTOSTERONE CYPIONATE 200 MG/ML IM SOLN: 100.0000 mg | INTRAMUSCULAR | Status: DC

## 2015-02-23 NOTE — Telephone Encounter (Signed)
Refilled medication. Discussed category med and how to write with our pharmacist.

## 2015-02-23 NOTE — Telephone Encounter (Signed)
Rx faxed to pharmacy. 02/23/15.

## 2015-03-04 HISTORY — PX: UMBILICAL HERNIA REPAIR: SHX196

## 2015-03-07 ENCOUNTER — Telehealth: Payer: Self-pay | Admitting: *Deleted

## 2015-03-07 NOTE — Telephone Encounter (Signed)
Received fax request from [facility] for PA for Testosterone, initiated via Cover My Meds; awaiting decision/SLS

## 2015-03-12 NOTE — Telephone Encounter (Signed)
Pt wife called bc they still cannot get testerone filled. She didn't realize it was for PA. Pt wife said insurance changed 09/01/14. Insurance updated but it will not verify Kennyth Lose can you verify thru website?).  [employer] [place] Member: [id number] Grp: [facility]: [id number] RX Group: Erik Obey

## 2015-03-14 NOTE — Telephone Encounter (Signed)
Received Denial on PA [id number], fax to pharmacy with request for Preferred alternative drug; awaiting response/SLS

## 2015-03-27 ENCOUNTER — Telehealth: Payer: Self-pay | Admitting: Family Medicine

## 2015-03-27 NOTE — Telephone Encounter (Signed)
So the best approach is to repeat his testosterone level now and see if it has dropped so we can argue with insurance. He has also not been seen in nearly a year and that was only an urgent visit so he needs an appt soon

## 2015-03-27 NOTE — Telephone Encounter (Signed)
Caller name: [name]   Relationship to patient: spouse   Can be reached: [phone]  Reason for call: Pt's spouse says that she receive something from the insurance company stating that based on the labs that were received pt doesn't qualify for testosterone. She provided phone number to insurance : [phone].  She is requesting further assistance.

## 2015-03-27 NOTE — Telephone Encounter (Signed)
PA completed on 03/07/2015 by Mackey Birchwood and was denied on 03/14/2015.  Advise on alternative.

## 2015-03-27 NOTE — Telephone Encounter (Signed)
Caller name: [name] Relationship to patient: wife Can be reached: [phone]  Reason for call: Following up - did not know the testosterone was denied - she is asking if we received a list yet for alternatives. [name] states she will try calling [employer] as well

## 2015-03-28 ENCOUNTER — Other Ambulatory Visit: Payer: Self-pay | Admitting: Family Medicine

## 2015-03-28 DIAGNOSIS — E349 Endocrine disorder, unspecified: Secondary | ICD-10-CM

## 2015-03-28 NOTE — Telephone Encounter (Signed)
Lab entered.  Called the patient informed to scheduled lab appt. And while here doing lab to schedule followup asap with PCP.  He agreed to do.

## 2015-03-28 NOTE — Telephone Encounter (Signed)
Lab entered for testosterone.

## 2015-04-03 NOTE — Telephone Encounter (Signed)
Per PCP, PA for testosterone is not to be further initiated until PT comes in for lab redraw and has appt with PCP.

## 2015-04-06 ENCOUNTER — Telehealth: Payer: Self-pay | Admitting: Behavioral Health

## 2015-04-06 NOTE — Telephone Encounter (Signed)
Unable to reach patient at time of Pre-Visit Call.  Left message for patient to return call when available.    

## 2015-04-09 ENCOUNTER — Encounter: Payer: Self-pay | Admitting: Family Medicine

## 2015-04-09 ENCOUNTER — Ambulatory Visit (INDEPENDENT_AMBULATORY_CARE_PROVIDER_SITE_OTHER): Payer: BLUE CROSS/BLUE SHIELD | Admitting: Family Medicine

## 2015-04-09 VITALS — BP 110/78 | HR 77 | Temp 98.2°F | Ht 68.0 in | Wt 221.1 lb

## 2015-04-09 DIAGNOSIS — E291 Testicular hypofunction: Secondary | ICD-10-CM | POA: Diagnosis not present

## 2015-04-09 DIAGNOSIS — M5030 Other cervical disc degeneration, unspecified cervical region: Secondary | ICD-10-CM | POA: Diagnosis not present

## 2015-04-09 DIAGNOSIS — E559 Vitamin D deficiency, unspecified: Secondary | ICD-10-CM

## 2015-04-09 DIAGNOSIS — E782 Mixed hyperlipidemia: Secondary | ICD-10-CM

## 2015-04-09 DIAGNOSIS — E349 Endocrine disorder, unspecified: Secondary | ICD-10-CM

## 2015-04-09 DIAGNOSIS — R209 Unspecified disturbances of skin sensation: Secondary | ICD-10-CM

## 2015-04-09 DIAGNOSIS — R030 Elevated blood-pressure reading, without diagnosis of hypertension: Secondary | ICD-10-CM

## 2015-04-09 DIAGNOSIS — IMO0001 Reserved for inherently not codable concepts without codable children: Secondary | ICD-10-CM

## 2015-04-09 DIAGNOSIS — Z0000 Encounter for general adult medical examination without abnormal findings: Secondary | ICD-10-CM

## 2015-04-09 DIAGNOSIS — E785 Hyperlipidemia, unspecified: Secondary | ICD-10-CM

## 2015-04-09 MED ORDER — TESTOSTERONE 20.25 MG/ACT (1.62%) TD GEL: 40.5000 mg | Freq: Every day | TRANSDERMAL | Status: DC

## 2015-04-09 NOTE — Progress Notes (Signed)
Casey Miles [id number] 1965/09/10 04/09/2015      Patient Progress Note   Subjective  Chief Complaint  Chief Complaint  Patient presents with  . Annual Exam    HPI  Patient presents for yearly physical exam. Overall doing well. Chest pain most likely related to bone spurs in neck. Chest pain has actually decreased but feels tightness and tingling in jaw, lower back. Has not had actual chest since beginning of year and it occurs once every other month. Pain goes away with flexeril after two days. Pain is tolerable. Lower levels energy due to season and not being able to get testosterone shot he was supposed to get in January. Exercising occasionally,  mostly hiking. Knows he should exercise more. Diet is going well.  Patient denies shortness of breath, chest pain,changes in urination, GI issues, recent fevers or illnesses     Past Medical History  Diagnosis Date  . Kidney stone   . GERD (gastroesophageal reflux disease)   . Chest wall pain   . SHOULDER PAIN, LEFT 07/17/2008  . PARESTHESIA 11/22/2009  . Other acute reactions to stress 09/26/2009  . NECK PAIN 09/26/2009  . 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 COSTOCHONDRITIS, RECURRENT 05/06/2010  . Chronic rhinitis 09/26/2009  . CHEST PAIN, ATYPICAL 05/04/2008  . Abdominal pain, epigastric 09/26/2009  . Arthralgia of multiple sites 08/13/2010  . Hyperlipidemia 10/08/2010  . Fatigue 01/08/2011  . Vitamin D deficiency 07/08/2011  . Testosterone deficiency 07/09/2011  . Elevated BP 10/08/2011  . Preventative health care 08/01/2012  . Other and unspecified hyperlipidemia 01/23/2013    No past surgical history on file.  Family History  Problem Relation Age of Onset  . Diabetes Mother   . Hypertension Mother   . Hyperlipidemia Mother   . Diabetes Maternal Grandmother   . Heart disease Maternal Grandmother   . Colitis Maternal Grandmother   . Diabetes Paternal Grandmother   . Irritable bowel syndrome    . Cancer       Esophageal- [employer]  . Stomach cancer      Uncle  . Colon polyps      Grandmother  . Colitis Maternal Grandfather   . Heart disease Maternal Grandfather   . Heart attack Father 36  . Diabetes Other     Family history of  . Hypertension Other     Family history of  . Heart attack Other     Family history of  . Irritable bowel syndrome Other     Family history of  . Lupus Cousin     Social History   Social History  . Marital Status: Married    Spouse Name: N/A  . Number of Children: N/A  . Years of Education: N/A   Occupational History  . Not on file.   Social History Main Topics  . Smoking status: Never Smoker   . Smokeless tobacco: Never Used  . Alcohol Use: 0.0 oz/week    1-2 Standard drinks or equivalent per week     Comment: 1-2 per week  . Drug Use: No  . Sexual Activity: Yes     Comment: lives wife and children, exercises intermittently, heart healthy diet.   Other Topics Concern  . Not on file   Social History Narrative    Current Outpatient Prescriptions on File Prior to Visit  Medication Sig Dispense Refill  . aspirin EC 81 MG tablet Take 81 mg by mouth daily.    Marland Kitchen CALCIUM-MAGNESIUM-ZINC PO Take 3  tablets by mouth daily.    . Coenzyme Q10 (CO Q-10) 100 MG CAPS Take by mouth daily.    . Cyanocobalamin (VITAMIN B-12) 1000 MCG SUBL Place 1 tablet (1,000 mcg total) under the tongue every morning.    . cyclobenzaprine (FLEXERIL) 10 MG tablet Take 1 tablet (10 mg total) by mouth 3 (three) times daily as needed for muscle spasms. 30 tablet 2  . Krill Oil CAPS 1 cap po daily    . niacin 500 MG tablet Take 500 mg by mouth at bedtime. 4 tablets daily    . Red Yeast Rice Extract (RED YEAST RICE PO) Take by mouth daily.    Marland Kitchen testosterone cypionate (DEPOTESTOSTERONE CYPIONATE) 200 MG/ML injection Inject 0.5 mLs (100 mg total) into the muscle every 14 (fourteen) days. 5 mL 0  . Vitamin D, Ergocalciferol, (DRISDOL) 50000 UNITS CAPS capsule Take 1 capsule  (50,000 Units total) by mouth every 7 (seven) days. 4 capsule 3   No current facility-administered medications on file prior to visit.    No Known Allergies  Review of Systems   Constitutional: Negative for fever and malaise/fatigue.  HENT: Negative for congestion.  Eyes: Negative for discharge.  Respiratory: Negative for shortness of breath.  Cardiovascular: Negative for chest pain, palpitations and leg swelling.  Gastrointestinal: Negative for nausea, abdominal pain and diarrhea.  Genitourinary: Negative for dysuria and urgency, hematuria and flank pain.  Musculoskeletal: Negative for myalgias and falls.  Skin: Negative for rash.  Neurological: Negative for loss of consciousness and headaches.  Endo/Heme/Allergies: Negative for polydipsia.  Psychiatric/Behavioral: Negative for depression and suicidal ideas. The patient is not nervous/anxious and does not have insomnia.   Objective  BP 110/78 mmHg  Pulse 77  Temp(Src) 98.2 F (36.8 C) (Oral)  Ht 5\' 8"  (1.727 m)  Wt 221 lb 2 oz (100.302 kg)  BMI 33.63 kg/m2  SpO2 95%  Physical Exam   Constitutional: Oriented to person, place, and time. Appears well-nourished. No distress.  Eyes: EOM are normal. Pupils are equal, round, and reactive to light.  Cardiovascular: Normal rate and regular rhythm.  Pulmonary/Chest: Breath sounds normal.  Abdominal: Soft. Bowel sounds are normal.  Lymphadenopathy:   No cervical adenopathy.  Neurological: Alert and oriented to person, place, and time. Normal reflexes. No cranial nerve deficit.    Assessment & Plan  Hyperlipidemia -Encouraged heart healthy diet, avoid trans fats, minimize simple carbs and saturated fats.  -Increase exercise as tolerated  GERD -Avoid offending foods, start probiotics.  -Do not eat large meals in late evening and consider raising head of bed.  -Continue medications PRN  Low Testosterone -Switch to topical -Insurance does not cover  injections  Costochondritis -Stable, continue to monitor  Lab Work Ordered -CBC, CMP, Lipid panel, A1c, Vit D, testosterone level

## 2015-04-09 NOTE — Patient Instructions (Signed)
Vitamin D 2000 IU daily NOW company at [email or web address]   Preventive Care for Adults, Male A healthy lifestyle and preventive care can promote health and wellness. Preventive health guidelines for men include the following key practices:  A routine yearly physical is a good way to check with your health care provider about your health and preventative screening. It is a chance to share any concerns and updates on your health and to receive a thorough exam.  Visit your dentist for a routine exam and preventative care every 6 months. Brush your teeth twice a day and floss once a day. Good oral hygiene prevents tooth decay and gum disease.  The frequency of eye exams is based on your age, health, family medical history, use of contact lenses, and other factors. Follow your health care provider's recommendations for frequency of eye exams.  Eat a healthy diet. Foods such as vegetables, fruits, whole grains, low-fat dairy products, and lean protein foods contain the nutrients you need without too many calories. Decrease your intake of foods high in solid fats, added sugars, and salt. Eat the right amount of calories for you.Get information about a proper diet from your health care provider, if necessary.  Regular physical exercise is one of the most important things you can do for your health. Most adults should get at least 150 minutes of moderate-intensity exercise (any activity that increases your heart rate and causes you to sweat) each week. In addition, most adults need muscle-strengthening exercises on 2 or more days a week.  Maintain a healthy weight. The body mass index (BMI) is a screening tool to identify possible weight problems. It provides an estimate of body fat based on height and weight. Your health care provider can find your BMI and can help you achieve or maintain a healthy weight.For adults 20 years and older:  A BMI below 18.5 is considered underweight.  A BMI of 18.5 to 24.9  is normal.  A BMI of 25 to 29.9 is considered overweight.  A BMI of 30 and above is considered obese.  Maintain normal blood lipids and cholesterol levels by exercising and minimizing your intake of saturated fat. Eat a balanced diet with plenty of fruit and vegetables. Blood tests for lipids and cholesterol should begin at age 42 and be repeated every 5 years. If your lipid or cholesterol levels are high, you are over 50, or you are at high risk for heart disease, you may need your cholesterol levels checked more frequently.Ongoing high lipid and cholesterol levels should be treated with medicines if diet and exercise are not working.  If you smoke, find out from your health care provider how to quit. If you do not use tobacco, do not start.  Lung cancer screening is recommended for adults aged 27-80 years who are at high risk for developing lung cancer because of a history of smoking. A yearly low-dose CT scan of the lungs is recommended for people who have at least a 30-pack-year history of smoking and are a current smoker or have quit within the past 15 years. A pack year of smoking is smoking an average of 1 pack of cigarettes a day for 1 year (for example: 1 pack a day for 30 years or 2 packs a day for 15 years). Yearly screening should continue until the smoker has stopped smoking for at least 15 years. Yearly screening should be stopped for people who develop a health problem that would prevent them from having lung cancer treatment.  If you choose to drink alcohol, do not have more than 2 drinks per day. One drink is considered to be 12 ounces (355 mL) of beer, 5 ounces (148 mL) of wine, or 1.5 ounces (44 mL) of liquor.  Avoid use of street drugs. Do not share needles with anyone. Ask for help if you need support or instructions about stopping the use of drugs.  High blood pressure causes heart disease and increases the risk of stroke. Your blood pressure should be checked at least every  1-2 years. Ongoing high blood pressure should be treated with medicines, if weight loss and exercise are not effective.  If you are 36-79 years old, ask your health care provider if you should take aspirin to prevent heart disease.  Diabetes screening is done by taking a blood sample to check your blood glucose level after you have not eaten for a certain period of time (fasting). If you are not overweight and you do not have risk factors for diabetes, you should be screened once every 3 years starting at age 93. If you are overweight or obese and you are 9-59 years of age, you should be screened for diabetes every year as part of your cardiovascular risk assessment.  Colorectal cancer can be detected and often prevented. Most routine colorectal cancer screening begins at the age of 89 and continues through age 68. However, your health care provider may recommend screening at an earlier age if you have risk factors for colon cancer. On a yearly basis, your health care provider may provide home test kits to check for hidden blood in the stool. Use of a small camera at the end of a tube to directly examine the colon (sigmoidoscopy or colonoscopy) can detect the earliest forms of colorectal cancer. Talk to your health care provider about this at age 60, when routine screening begins. Direct exam of the colon should be repeated every 5-10 years through age 40, unless early forms of precancerous polyps or small growths are found.  People who are at an increased risk for hepatitis B should be screened for this virus. You are considered at high risk for hepatitis B if:  You were born in a country where hepatitis B occurs often. Talk with your health care provider about which countries are considered high risk.  Your parents were born in a high-risk country and you have not received a shot to protect against hepatitis B (hepatitis B vaccine).  You have HIV or AIDS.  You use needles to inject street  drugs.  You live with, or have sex with, someone who has hepatitis B.  You are a man who has sex with other men (MSM).  You get hemodialysis treatment.  You take certain medicines for conditions such as cancer, organ transplantation, and autoimmune conditions.  Hepatitis C blood testing is recommended for all people born from [birth year] through 1965 and any individual with known risks for hepatitis C.  Practice safe sex. Use condoms and avoid high-risk sexual practices to reduce the spread of sexually transmitted infections (STIs). STIs include gonorrhea, chlamydia, syphilis, trichomonas, herpes, HPV, and human immunodeficiency virus (HIV). Herpes, HIV, and HPV are viral illnesses that have no cure. They can result in disability, cancer, and death.  If you are a man who has sex with other men, you should be screened at least once per year for:  HIV.  Urethral, rectal, and pharyngeal infection of gonorrhea, chlamydia, or both.  If you are at risk of being  infected with HIV, it is recommended that you take a prescription medicine daily to prevent HIV infection. This is called preexposure prophylaxis (PrEP). You are considered at risk if:  You are a man who has sex with other men (MSM) and have other risk factors.  You are a heterosexual man, are sexually active, and are at increased risk for HIV infection.  You take drugs by injection.  You are sexually active with a partner who has HIV.  Talk with your health care provider about whether you are at high risk of being infected with HIV. If you choose to begin PrEP, you should first be tested for HIV. You should then be tested every 3 months for as long as you are taking PrEP.  A one-time screening for abdominal aortic aneurysm (AAA) and surgical repair of large AAAs by ultrasound are recommended for men ages 65 to 75 years who are current or former smokers.  Healthy men should no longer receive prostate-specific antigen (PSA) blood tests as  part of routine cancer screening. Talk with your health care provider about prostate cancer screening.  Testicular cancer screening is not recommended for adult males who have no symptoms. Screening includes self-exam, a health care provider exam, and other screening tests. Consult with your health care provider about any symptoms you have or any concerns you have about testicular cancer.  Use sunscreen. Apply sunscreen liberally and repeatedly throughout the day. You should seek shade when your shadow is shorter than you. Protect yourself by wearing long sleeves, pants, a wide-brimmed hat, and sunglasses year round, whenever you are outdoors.  Once a month, do a whole-body skin exam, using a mirror to look at the skin on your back. Tell your health care provider about new moles, moles that have irregular borders, moles that are larger than a pencil eraser, or moles that have changed in shape or color.  Stay current with required vaccines (immunizations).  Influenza vaccine. All adults should be immunized every year.  Tetanus, diphtheria, and acellular pertussis (Td, Tdap) vaccine. An adult who has not previously received Tdap or who does not know his vaccine status should receive 1 dose of Tdap. This initial dose should be followed by tetanus and diphtheria toxoids (Td) booster doses every 10 years. Adults with an unknown or incomplete history of completing a 3-dose immunization series with Td-containing vaccines should begin or complete a primary immunization series including a Tdap dose. Adults should receive a Td booster every 10 years.  Varicella vaccine. An adult without evidence of immunity to varicella should receive 2 doses or a second dose if he has previously received 1 dose.  Human papillomavirus (HPV) vaccine. Males aged 11-21 years who have not received the vaccine previously should receive the 3-dose series. Males aged 22-26 years may be immunized. Immunization is recommended through  the age of 26 years for any male who has sex with males and did not get any or all doses earlier. Immunization is recommended for any person with an immunocompromised condition through the age of 26 years if he did not get any or all doses earlier. During the 3-dose series, the second dose should be obtained 4-8 weeks after the first dose. The third dose should be obtained 24 weeks after the first dose and 16 weeks after the second dose.  Zoster vaccine. One dose is recommended for adults aged 60 years or older unless certain conditions are present.  Measles, mumps, and rubella (MMR) vaccine. Adults born before 1957 generally are   considered immune to measles and mumps. Adults born in 1957 or later should have 1 or more doses of MMR vaccine unless there is a contraindication to the vaccine or there is laboratory evidence of immunity to each of the three diseases. A routine second dose of MMR vaccine should be obtained at least 28 days after the first dose for students attending postsecondary schools, health care workers, or international travelers. People who received inactivated measles vaccine or an unknown type of measles vaccine during 1963-1967 should receive 2 doses of MMR vaccine. People who received inactivated mumps vaccine or an unknown type of mumps vaccine before 1979 and are at high risk for mumps infection should consider immunization with 2 doses of MMR vaccine. Unvaccinated health care workers born before 1957 who lack laboratory evidence of measles, mumps, or rubella immunity or laboratory confirmation of disease should consider measles and mumps immunization with 2 doses of MMR vaccine or rubella immunization with 1 dose of MMR vaccine.  Pneumococcal 13-valent conjugate (PCV13) vaccine. When indicated, a person who is uncertain of his immunization history and has no record of immunization should receive the PCV13 vaccine. All adults 65 years of age and older should receive this vaccine. An  adult aged 19 years or older who has certain medical conditions and has not been previously immunized should receive 1 dose of PCV13 vaccine. This PCV13 should be followed with a dose of pneumococcal polysaccharide (PPSV23) vaccine. Adults who are at high risk for pneumococcal disease should obtain the PPSV23 vaccine at least 8 weeks after the dose of PCV13 vaccine. Adults older than 48 years of age who have normal immune system function should obtain the PPSV23 vaccine dose at least 1 year after the dose of PCV13 vaccine.  Pneumococcal polysaccharide (PPSV23) vaccine. When PCV13 is also indicated, PCV13 should be obtained first. All adults aged 65 years and older should be immunized. An adult younger than age 65 years who has certain medical conditions should be immunized. Any person who resides in a nursing home or long-term care facility should be immunized. An adult smoker should be immunized. People with an immunocompromised condition and certain other conditions should receive both PCV13 and PPSV23 vaccines. People with human immunodeficiency virus (HIV) infection should be immunized as soon as possible after diagnosis. Immunization during chemotherapy or radiation therapy should be avoided. Routine use of PPSV23 vaccine is not recommended for American Indians, [place] Natives, or people younger than 65 years unless there are medical conditions that require PPSV23 vaccine. When indicated, people who have unknown immunization and have no record of immunization should receive PPSV23 vaccine. One-time revaccination 5 years after the first dose of PPSV23 is recommended for people aged 19-64 years who have chronic kidney failure, nephrotic syndrome, asplenia, or immunocompromised conditions. People who received 1-2 doses of PPSV23 before age 65 years should receive another dose of PPSV23 vaccine at age 65 years or later if at least 5 years have passed since the previous dose. Doses of PPSV23 are not needed for  people immunized with PPSV23 at or after age 65 years.  Meningococcal vaccine. Adults with asplenia or persistent complement component deficiencies should receive 2 doses of quadrivalent meningococcal conjugate (MenACWY-D) vaccine. The doses should be obtained at least 2 months apart. Microbiologists working with certain meningococcal bacteria, military recruits, people at risk during an outbreak, and people who travel to or live in countries with a high rate of meningitis should be immunized. A first-year college student up through age 21 years   who is living in a residence hall should receive a dose if he did not receive a dose on or after his 16th birthday. Adults who have certain high-risk conditions should receive one or more doses of vaccine.  Hepatitis A vaccine. Adults who wish to be protected from this disease, have chronic liver disease, work with hepatitis A-infected animals, work in hepatitis A research labs, or travel to or work in countries with a high rate of hepatitis A should be immunized. Adults who were previously unvaccinated and who anticipate close contact with an international adoptee during the first 60 days after arrival in the [place] from a country with a high rate of hepatitis A should be immunized.  Hepatitis B vaccine. Adults should be immunized if they wish to be protected from this disease, are under age 54 years and have diabetes, have chronic liver disease, have had more than one sex partner in the past 6 months, may be exposed to blood or other infectious body fluids, are household contacts or sex partners of hepatitis B positive people, are clients or workers in certain care facilities, or travel to or work in countries with a high rate of hepatitis B.  Haemophilus influenzae type b (Hib) vaccine. A previously unvaccinated person with asplenia or sickle cell disease or having a scheduled splenectomy should receive 1 dose of Hib vaccine. Regardless of previous  immunization, a recipient of a hematopoietic stem cell transplant should receive a 3-dose series 6-12 months after his successful transplant. Hib vaccine is not recommended for adults with HIV infection. Preventive Service / Frequency Ages 33 to 24  Blood pressure check.** / Every 3-5 years.  Lipid and cholesterol check.** / Every 5 years beginning at age 4.  Hepatitis C blood test.** / For any individual with known risks for hepatitis C.  Skin self-exam. / Monthly.  Influenza vaccine. / Every year.  Tetanus, diphtheria, and acellular pertussis (Tdap, Td) vaccine.** / Consult your health care provider. 1 dose of Td every 10 years.  Varicella vaccine.** / Consult your health care provider.  HPV vaccine. / 3 doses over 6 months, if 81 or younger.  Measles, mumps, rubella (MMR) vaccine.** / You need at least 1 dose of MMR if you were born in 1957 or later. You may also need a second dose.  Pneumococcal 13-valent conjugate (PCV13) vaccine.** / Consult your health care provider.  Pneumococcal polysaccharide (PPSV23) vaccine.** / 1 to 2 doses if you smoke cigarettes or if you have certain conditions.  Meningococcal vaccine.** / 1 dose if you are age 78 to 45 years and a Market researcher living in a residence hall, or have one of several medical conditions. You may also need additional booster doses.  Hepatitis A vaccine.** / Consult your health care provider.  Hepatitis B vaccine.** / Consult your health care provider.  Haemophilus influenzae type b (Hib) vaccine.** / Consult your health care provider. Ages 19 to 19  Blood pressure check.** / Every year.  Lipid and cholesterol check.** / Every 5 years beginning at age 50.  Lung cancer screening. / Every year if you are aged 76-80 years and have a 30-pack-year history of smoking and currently smoke or have quit within the past 15 years. Yearly screening is stopped once you have quit smoking for at least 15 years or develop  a health problem that would prevent you from having lung cancer treatment.  Fecal occult blood test (FOBT) of stool. / Every year beginning at age 32 and  continuing until age 51. You may not have to do this test if you get a colonoscopy every 10 years.  Flexible sigmoidoscopy** or colonoscopy.** / Every 5 years for a flexible sigmoidoscopy or every 10 years for a colonoscopy beginning at age 42 and continuing until age 62.  Hepatitis C blood test.** / For all people born from [birth year] through 1965 and any individual with known risks for hepatitis C.  Skin self-exam. / Monthly.  Influenza vaccine. / Every year.  Tetanus, diphtheria, and acellular pertussis (Tdap/Td) vaccine.** / Consult your health care provider. 1 dose of Td every 10 years.  Varicella vaccine.** / Consult your health care provider.  Zoster vaccine.** / 1 dose for adults aged 87 years or older.  Measles, mumps, rubella (MMR) vaccine.** / You need at least 1 dose of MMR if you were born in 1957 or later. You may also need a second dose.  Pneumococcal 13-valent conjugate (PCV13) vaccine.** / Consult your health care provider.  Pneumococcal polysaccharide (PPSV23) vaccine.** / 1 to 2 doses if you smoke cigarettes or if you have certain conditions.  Meningococcal vaccine.** / Consult your health care provider.  Hepatitis A vaccine.** / Consult your health care provider.  Hepatitis B vaccine.** / Consult your health care provider.  Haemophilus influenzae type b (Hib) vaccine.** / Consult your health care provider. Ages 85 and over  Blood pressure check.** / Every year.  Lipid and cholesterol check.**/ Every 5 years beginning at age 10.  Lung cancer screening. / Every year if you are aged 67-80 years and have a 30-pack-year history of smoking and currently smoke or have quit within the past 15 years. Yearly screening is stopped once you have quit smoking for at least 15 years or develop a health problem that would prevent  you from having lung cancer treatment.  Fecal occult blood test (FOBT) of stool. / Every year beginning at age 66 and continuing until age 81. You may not have to do this test if you get a colonoscopy every 10 years.  Flexible sigmoidoscopy** or colonoscopy.** / Every 5 years for a flexible sigmoidoscopy or every 10 years for a colonoscopy beginning at age 42 and continuing until age 83.  Hepatitis C blood test.** / For all people born from [birth year] through 1963 and any individual with known risks for hepatitis C.  Abdominal aortic aneurysm (AAA) screening.** / A one-time screening for ages 44 to 95 years who are current or former smokers.  Skin self-exam. / Monthly.  Influenza vaccine. / Every year.  Tetanus, diphtheria, and acellular pertussis (Tdap/Td) vaccine.** / 1 dose of Td every 10 years.  Varicella vaccine.** / Consult your health care provider.  Zoster vaccine.** / 1 dose for adults aged 73 years or older.  Pneumococcal 13-valent conjugate (PCV13) vaccine.** / 1 dose for all adults aged 67 years and older.  Pneumococcal polysaccharide (PPSV23) vaccine.** / 1 dose for all adults aged 72 years and older.  Meningococcal vaccine.** / Consult your health care provider.  Hepatitis A vaccine.** / Consult your health care provider.  Hepatitis B vaccine.** / Consult your health care provider.  Haemophilus influenzae type b (Hib) vaccine.** / Consult your health care provider. **Family history and personal history of risk and conditions may change your health care provider's recommendations.   This information is not intended to replace advice given to you by your health care provider. Make sure you discuss any questions you have with your health care provider.   Document Released: 04/15/2001 Document  Revised: 03/10/2014 Document Reviewed: 07/15/2010 Elsevier Interactive Patient Education 2016 [employer]  

## 2015-04-09 NOTE — Progress Notes (Signed)
Pre visit review using our clinic review tool, if applicable. No additional management support is needed unless otherwise documented below in the visit note. 

## 2015-04-10 LAB — LIPID PANEL
CHOL/HDL RATIO: 5
CHOLESTEROL: 172 mg/dL (ref 0–200)
HDL: 38 mg/dL — AB (ref >39.00)
NonHDL: 134.01
Triglycerides: 274 mg/dL — ABNORMAL HIGH (ref 0.0–149.0)
VLDL: 54.8 mg/dL — AB (ref 0.0–40.0)

## 2015-04-10 LAB — TESTOSTERONE: TESTOSTERONE: 308.56 ng/dL (ref 300.00–890.00)

## 2015-04-10 LAB — CBC
HCT: 44.3 % (ref 39.0–52.0)
Hemoglobin: 14.9 g/dL (ref 13.0–17.0)
MCHC: 33.8 g/dL (ref 30.0–36.0)
MCV: 94.8 fl (ref 78.0–100.0)
Platelets: 213 10*3/uL (ref 150.0–400.0)
RBC: 4.67 Mil/uL (ref 4.22–5.81)
RDW: 12.6 % (ref 11.5–15.5)
WBC: 6.1 10*3/uL (ref 4.0–10.5)

## 2015-04-10 LAB — COMPREHENSIVE METABOLIC PANEL
ALT: 31 U/L (ref 0–53)
AST: 29 U/L (ref 0–37)
Albumin: 4.5 g/dL (ref 3.5–5.2)
Alkaline Phosphatase: 50 U/L (ref 39–117)
BUN: 19 mg/dL (ref 6–23)
CHLORIDE: 102 meq/L (ref 96–112)
CO2: 32 meq/L (ref 19–32)
CREATININE: 1.16 mg/dL (ref 0.40–1.50)
Calcium: 9.7 mg/dL (ref 8.4–10.5)
GFR: 71.51 mL/min (ref >60.00)
Glucose, Bld: 80 mg/dL (ref 70–99)
POTASSIUM: 4.2 meq/L (ref 3.5–5.1)
SODIUM: 139 meq/L (ref 135–145)
Total Bilirubin: 0.9 mg/dL (ref 0.2–1.2)
Total Protein: 7.6 g/dL (ref 6.0–8.3)

## 2015-04-10 LAB — LDL CHOLESTEROL, DIRECT: Direct LDL: 78 mg/dL

## 2015-04-10 LAB — VITAMIN D 25 HYDROXY (VIT D DEFICIENCY, FRACTURES): VITD: 28.92 ng/mL — AB (ref 30.00–100.00)

## 2015-04-10 LAB — HEMOGLOBIN A1C: Hgb A1c MFr Bld: 5.4 % (ref 4.6–6.5)

## 2015-04-10 LAB — TSH: TSH: 0.87 u[IU]/mL (ref 0.35–4.50)

## 2015-04-15 NOTE — Assessment & Plan Note (Signed)
Still struggles with pain intermittently but is actually improved and he notes less secondary chest pain than in the past.

## 2015-04-15 NOTE — Assessment & Plan Note (Signed)
Improving but needs daily supplements still.

## 2015-04-15 NOTE — Assessment & Plan Note (Signed)
Patient encouraged to maintain heart healthy diet, regular exercise, adequate sleep. Consider daily probiotics. Take medications as prescribed. Labs reviewed 

## 2015-04-15 NOTE — Assessment & Plan Note (Signed)
Encouraged heart healthy diet, increase exercise, avoid trans fats, consider a krill oil cap daily 

## 2015-04-15 NOTE — Assessment & Plan Note (Signed)
Will try topical treatments and monitor

## 2015-04-15 NOTE — Progress Notes (Signed)
Patient ID: QUI SIT, male   DOB: [birth year], 50 y.o.   MRN: [id number]   Subjective:    Patient ID: Casey Miles, male    DOB: 10-May-1965, 50 y.o.   MRN: [id number]  Chief Complaint  Patient presents with  . Annual Exam    HPI Patient presents for yearly physical exam. Overall doing well. Chest pain most likely related to bone spurs in neck. Chest pain has actually decreased but feels tightness and tingling in jaw, lower back. Has not had actual chest since beginning of year and it occurs once every other month. Pain goes away with flexeril after two days. Pain is tolerable. Lower levels energy due to season and not being able to get testosterone shot he was supposed to get in January. Exercising occasionally,  mostly hiking. Knows he should exercise more. Diet is going well.  Patient denies shortness of breath, chest pain,changes in urination, GI issues, recent fevers or illnesses    Past Medical History  Diagnosis Date  . Kidney stone   . GERD (gastroesophageal reflux disease)   . Chest wall pain   . SHOULDER PAIN, LEFT 07/17/2008  . PARESTHESIA 11/22/2009  . Other acute reactions to stress 09/26/2009  . NECK PAIN 09/26/2009  . 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 COSTOCHONDRITIS, RECURRENT 05/06/2010  . Chronic rhinitis 09/26/2009  . CHEST PAIN, ATYPICAL 05/04/2008  . Abdominal pain, epigastric 09/26/2009  . Arthralgia of multiple sites 08/13/2010  . Hyperlipidemia 10/08/2010  . Fatigue 01/08/2011  . Vitamin D deficiency 07/08/2011  . Testosterone deficiency 07/09/2011  . Elevated BP 10/08/2011  . Preventative health care 08/01/2012  . Other and unspecified hyperlipidemia 01/23/2013    History reviewed. No pertinent past surgical history.  Family History  Problem Relation Age of Onset  . Diabetes Mother   . Hypertension Mother   . Hyperlipidemia Mother   . Diabetes Maternal Grandmother   . Heart disease Maternal Grandmother   . Colitis Maternal Grandmother   .  Diabetes Paternal Grandmother   . Irritable bowel syndrome    . Cancer      Esophageal- [employer]  . Stomach cancer      Uncle  . Colon polyps      Grandmother  . Colitis Maternal Grandfather   . Heart disease Maternal Grandfather   . Heart attack Father 47  . Diabetes Other     Family history of  . Hypertension Other     Family history of  . Heart attack Other     Family history of  . Irritable bowel syndrome Other     Family history of  . Lupus Cousin     Social History   Social History  . Marital Status: Married    Spouse Name: N/A  . Number of Children: N/A  . Years of Education: N/A   Occupational History  . Not on file.   Social History Main Topics  . Smoking status: Never Smoker   . Smokeless tobacco: Never Used  . Alcohol Use: 0.0 oz/week    1-2 Standard drinks or equivalent per week     Comment: 1-2 per week  . Drug Use: No  . Sexual Activity: Yes     Comment: lives wife and children, exercises intermittently, heart healthy diet.   Other Topics Concern  . Not on file   Social History Narrative    Outpatient Prescriptions Prior to Visit  Medication Sig Dispense Refill  . aspirin EC 81 MG  tablet Take 81 mg by mouth daily.    Marland Kitchen CALCIUM-MAGNESIUM-ZINC PO Take 3 tablets by mouth daily.    . Coenzyme Q10 (CO Q-10) 100 MG CAPS Take by mouth daily.    . Cyanocobalamin (VITAMIN B-12) 1000 MCG SUBL Place 1 tablet (1,000 mcg total) under the tongue every morning.    . cyclobenzaprine (FLEXERIL) 10 MG tablet Take 1 tablet (10 mg total) by mouth 3 (three) times daily as needed for muscle spasms. 30 tablet 2  . Krill Oil CAPS 1 cap po daily    . niacin 500 MG tablet Take 500 mg by mouth at bedtime. 4 tablets daily    . Red Yeast Rice Extract (RED YEAST RICE PO) Take by mouth daily.    Marland Kitchen testosterone cypionate (DEPOTESTOSTERONE CYPIONATE) 200 MG/ML injection Inject 0.5 mLs (100 mg total) into the muscle every 14 (fourteen) days. 5 mL 0  . Vitamin D,  Ergocalciferol, (DRISDOL) 50000 UNITS CAPS capsule Take 1 capsule (50,000 Units total) by mouth every 7 (seven) days. 4 capsule 3  . albuterol (PROVENTIL HFA;VENTOLIN HFA) 108 (90 BASE) MCG/ACT inhaler Inhale 2 puffs into the lungs every 6 (six) hours as needed for wheezing or shortness of breath. 1 Inhaler 0  . beclomethasone (QVAR) 40 MCG/ACT inhaler Inhale 2 puffs into the lungs 2 (two) times daily. (Patient not taking: Reported on 05/23/2014) 1 Inhaler 2  . fluconazole (DIFLUCAN) 150 MG tablet Take 1 tablet (150 mg total) by mouth once a week. 3 tablet 0  . HYDROcodone-homatropine (HYCODAN) 5-1.5 MG/5ML syrup Take 5 mLs by mouth every 8 (eight) hours as needed for cough. (Patient not taking: Reported on 05/23/2014) 120 mL 0  . metroNIDAZOLE (FLAGYL) 500 MG tablet Take 1 tablet (500 mg total) by mouth 3 (three) times daily. 21 tablet 0   No facility-administered medications prior to visit.    No Known Allergies  Review of Systems  Constitutional: Negative for fever, chills and malaise/fatigue.  HENT: Negative for congestion and hearing loss.   Eyes: Negative for discharge.  Respiratory: Negative for cough, sputum production and shortness of breath.   Cardiovascular: Negative for chest pain, palpitations and leg swelling.  Gastrointestinal: Negative for heartburn, nausea, vomiting, abdominal pain, diarrhea, constipation and blood in stool.  Genitourinary: Negative for dysuria, urgency, frequency and hematuria.  Musculoskeletal: Negative for myalgias, back pain and falls.  Skin: Negative for rash.  Neurological: Negative for dizziness, sensory change, loss of consciousness, weakness and headaches.  Endo/Heme/Allergies: Negative for environmental allergies. Does not bruise/bleed easily.  Psychiatric/Behavioral: Negative for depression and suicidal ideas. The patient is not nervous/anxious and does not have insomnia.        Objective:    Physical Exam  Constitutional: He is oriented to  person, place, and time. He appears well-developed and well-nourished. No distress.  HENT:  Head: Normocephalic and atraumatic.  Eyes: Conjunctivae are normal.  Neck: Neck supple. No thyromegaly present.  Cardiovascular: Normal rate, regular rhythm and normal heart sounds.   No murmur heard. Pulmonary/Chest: Effort normal and breath sounds normal. No respiratory distress. He has no wheezes.  Abdominal: Soft. Bowel sounds are normal. He exhibits no mass. There is no tenderness.  Musculoskeletal: He exhibits no edema.  Lymphadenopathy:    He has no cervical adenopathy.  Neurological: He is alert and oriented to person, place, and time.  Skin: Skin is warm and dry.  Psychiatric: He has a normal mood and affect. His behavior is normal.    BP 110/78 mmHg  Pulse 77  Temp(Src) 98.2 F (36.8 C) (Oral)  Ht 5\' 8"  (1.727 m)  Wt 221 lb 2 oz (100.302 kg)  BMI 33.63 kg/m2  SpO2 95% Wt Readings from Last 3 Encounters:  04/09/15 221 lb 2 oz (100.302 kg)  05/23/14 207 lb 9.6 oz (94.167 kg)  03/02/14 220 lb 3.2 oz (99.882 kg)     Lab Results  Component Value Date   WBC 6.1 04/09/2015   HGB 14.9 04/09/2015   HCT 44.3 04/09/2015   PLT 213.0 04/09/2015   GLUCOSE 80 04/09/2015   CHOL 172 04/09/2015   TRIG 274.0* 04/09/2015   HDL 38.00* 04/09/2015   LDLDIRECT 78.0 04/09/2015   LDLCALC 100* 05/24/2014   ALT 31 04/09/2015   AST 29 04/09/2015   NA 139 04/09/2015   K 4.2 04/09/2015   CL 102 04/09/2015   CREATININE 1.16 04/09/2015   BUN 19 04/09/2015   CO2 32 04/09/2015   TSH 0.87 04/09/2015   INR 0.96 03/12/2010   HGBA1C 5.4 04/09/2015    Lab Results  Component Value Date   TSH 0.87 04/09/2015   Lab Results  Component Value Date   WBC 6.1 04/09/2015   HGB 14.9 04/09/2015   HCT 44.3 04/09/2015   MCV 94.8 04/09/2015   PLT 213.0 04/09/2015   Lab Results  Component Value Date   NA 139 04/09/2015   K 4.2 04/09/2015   CO2 32 04/09/2015   GLUCOSE 80 04/09/2015   BUN 19  04/09/2015   CREATININE 1.16 04/09/2015   BILITOT 0.9 04/09/2015   ALKPHOS 50 04/09/2015   AST 29 04/09/2015   ALT 31 04/09/2015   PROT 7.6 04/09/2015   ALBUMIN 4.5 04/09/2015   CALCIUM 9.7 04/09/2015   GFR 71.51 04/09/2015   Lab Results  Component Value Date   CHOL 172 04/09/2015   Lab Results  Component Value Date   HDL 38.00* 04/09/2015   Lab Results  Component Value Date   LDLCALC 100* 05/24/2014   Lab Results  Component Value Date   TRIG 274.0* 04/09/2015   Lab Results  Component Value Date   CHOLHDL 5 04/09/2015   Lab Results  Component Value Date   HGBA1C 5.4 04/09/2015       Assessment & Plan:   Problem List Items Addressed This Visit    DEGENERATIVE DISC DISEASE, CERVICAL SPINE    Still struggles with pain intermittently but is actually improved and he notes less secondary chest pain than in the past.       Elevated BP - Primary   Relevant Orders   CBC (Completed)   TSH (Completed)   Comprehensive metabolic panel (Completed)   Hemoglobin A1c (Completed)   Lipid panel (Completed)   Vitamin D (25 hydroxy) (Completed)   Testosterone (Completed)   RESOLVED: Hyperlipidemia   Relevant Orders   CBC (Completed)   TSH (Completed)   Comprehensive metabolic panel (Completed)   Hemoglobin A1c (Completed)   Lipid panel (Completed)   Vitamin D (25 hydroxy) (Completed)   Testosterone (Completed)   Mixed hyperlipidemia    Encouraged heart healthy diet, increase exercise, avoid trans fats, consider a krill oil cap daily      PARESTHESIA   Relevant Orders   CBC (Completed)   TSH (Completed)   Comprehensive metabolic panel (Completed)   Hemoglobin A1c (Completed)   Lipid panel (Completed)   Vitamin D (25 hydroxy) (Completed)   Testosterone (Completed)   Preventative health care    Patient encouraged to maintain heart healthy  diet, regular exercise, adequate sleep. Consider daily probiotics. Take medications as prescribed. Labs reviewed.        Testosterone deficiency    Will try topical treatments and monitor      Relevant Orders   CBC (Completed)   TSH (Completed)   Comprehensive metabolic panel (Completed)   Hemoglobin A1c (Completed)   Lipid panel (Completed)   Vitamin D (25 hydroxy) (Completed)   Testosterone (Completed)   Vitamin D deficiency    Improving but needs daily supplements still.      Relevant Orders   CBC (Completed)   TSH (Completed)   Comprehensive metabolic panel (Completed)   Hemoglobin A1c (Completed)   Lipid panel (Completed)   Vitamin D (25 hydroxy) (Completed)   Testosterone (Completed)      I have discontinued Mr. Slauson HYDROcodone-homatropine, beclomethasone, albuterol, Vitamin D (Ergocalciferol), fluconazole, metroNIDAZOLE, and testosterone cypionate. I am also having him start on Testosterone. Additionally, I am having him maintain his CALCIUM-MAGNESIUM-ZINC PO, Vitamin B-12, Krill Oil, cyclobenzaprine, Red Yeast Rice Extract (RED YEAST RICE PO), Co Q-10, aspirin EC, and niacin.  Meds ordered this encounter  Medications  . Testosterone (ANDROGEL PUMP) 20.25 MG/ACT (1.62%) GEL    Sig: Place 40.5 mg onto the skin daily.    Dispense:  75 g    Refill:  5     Penni Homans, MD

## 2015-04-16 NOTE — Telephone Encounter (Signed)
Appeal Approved for Testosterone Cypionate/SLS

## 2015-05-09 ENCOUNTER — Ambulatory Visit (INDEPENDENT_AMBULATORY_CARE_PROVIDER_SITE_OTHER): Payer: BLUE CROSS/BLUE SHIELD | Admitting: Medical

## 2015-05-09 ENCOUNTER — Encounter: Payer: Self-pay | Admitting: Medical

## 2015-05-09 VITALS — BP 120/80 | HR 87 | Temp 98.0°F | Ht 68.0 in | Wt 219.0 lb

## 2015-05-09 DIAGNOSIS — K429 Umbilical hernia without obstruction or gangrene: Secondary | ICD-10-CM | POA: Diagnosis not present

## 2015-05-09 MED ORDER — DICLOFENAC SODIUM 75 MG PO TBEC: 75.0000 mg | DELAYED_RELEASE_TABLET | Freq: Two times a day (BID) | ORAL | Status: DC

## 2015-05-09 NOTE — Patient Instructions (Addendum)
You do appear to have small umbilical hernia. But it is quite symptomatic for size. So I am going to try to schedule you with general surgeon by Friday or early Monday.   Conservative measures to decrease pressure over area and pain.  Diclofenac for pain.  If pain increases, fever, chills, nausea, vomiting or more diffuse area of pain then ED evaluation.  Follow up with me in on week if surgeon referral delayed.

## 2015-05-09 NOTE — Progress Notes (Signed)
Subjective:    Patient ID: Casey Miles, male    DOB: 1965-06-13, 50 y.o.   MRN: [id number]  HPI   Pt in with some faint navel area tenderness. Looks little discolored week ago but not now. Pt states some pain over past week. Faint tender. When he bends over has some faint pain. Pt is going on camping trip this weekend so he wanted evaluation before he left.  Pt mentioned some fever , bodyaches, loose stools  and tired 2 wks ago and got over that. Did not get  Evaluated. Pt states ate yogurt culturelle. These type signs and synmptoms have resolved. No reoccurence of GI type signs and symptoms.   Review of Systems  Constitutional: Negative for fever, chills and fatigue.  Respiratory: Negative for cough.   Cardiovascular: Negative for chest pain and palpitations.  Gastrointestinal: Positive for abdominal pain. Negative for nausea, diarrhea, constipation, blood in stool, abdominal distention and anal bleeding.       On umbilical pain as mentioned hop.  Musculoskeletal: Negative for back pain.  Skin: Negative for rash.  Hematological: Negative for adenopathy. Does not bruise/bleed easily.  Psychiatric/Behavioral: Negative for behavioral problems and confusion.    Past Medical History  Diagnosis Date  . Kidney stone   . GERD (gastroesophageal reflux disease)   . Chest wall pain   . SHOULDER PAIN, LEFT 07/17/2008  . PARESTHESIA 11/22/2009  . Other acute reactions to stress 09/26/2009  . NECK PAIN 09/26/2009  . 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 COSTOCHONDRITIS, RECURRENT 05/06/2010  . Chronic rhinitis 09/26/2009  . CHEST PAIN, ATYPICAL 05/04/2008  . Abdominal pain, epigastric 09/26/2009  . Arthralgia of multiple sites 08/13/2010  . Hyperlipidemia 10/08/2010  . Fatigue 01/08/2011  . Vitamin D deficiency 07/08/2011  . Testosterone deficiency 07/09/2011  . Elevated BP 10/08/2011  . Preventative health care 08/01/2012  . Other and unspecified hyperlipidemia 01/23/2013    Social  History   Social History  . Marital Status: Married    Spouse Name: N/A  . Number of Children: N/A  . Years of Education: N/A   Occupational History  . Not on file.   Social History Main Topics  . Smoking status: Never Smoker   . Smokeless tobacco: Never Used  . Alcohol Use: 0.0 oz/week    1-2 Standard drinks or equivalent per week     Comment: 1-2 per week  . Drug Use: No  . Sexual Activity: Yes     Comment: lives wife and children, exercises intermittently, heart healthy diet.   Other Topics Concern  . Not on file   Social History Narrative    No past surgical history on file.  Family History  Problem Relation Age of Onset  . Diabetes Mother   . Hypertension Mother   . Hyperlipidemia Mother   . Diabetes Maternal Grandmother   . Heart disease Maternal Grandmother   . Colitis Maternal Grandmother   . Diabetes Paternal Grandmother   . Irritable bowel syndrome    . Cancer      Esophageal- [employer]  . Stomach cancer      Uncle  . Colon polyps      Grandmother  . Colitis Maternal Grandfather   . Heart disease Maternal Grandfather   . Heart attack Father 29  . Diabetes Other     Family history of  . Hypertension Other     Family history of  . Heart attack Other     Family history  of  . Irritable bowel syndrome Other     Family history of  . Lupus Cousin     No Known Allergies  Current Outpatient Prescriptions on File Prior to Visit  Medication Sig Dispense Refill  . aspirin EC 81 MG tablet Take 81 mg by mouth daily.    Marland Kitchen CALCIUM-MAGNESIUM-ZINC PO Take 3 tablets by mouth daily.    . Coenzyme Q10 (CO Q-10) 100 MG CAPS Take by mouth daily.    . Cyanocobalamin (VITAMIN B-12) 1000 MCG SUBL Place 1 tablet (1,000 mcg total) under the tongue every morning.    . cyclobenzaprine (FLEXERIL) 10 MG tablet Take 1 tablet (10 mg total) by mouth 3 (three) times daily as needed for muscle spasms. 30 tablet 2  . Krill Oil CAPS 1 cap po daily    . niacin 500 MG  tablet Take 500 mg by mouth at bedtime. 4 tablets daily    . Red Yeast Rice Extract (RED YEAST RICE PO) Take by mouth daily.    . Testosterone (ANDROGEL PUMP) 20.25 MG/ACT (1.62%) GEL Place 40.5 mg onto the skin daily. 75 g 5   No current facility-administered medications on file prior to visit.    BP 120/80 mmHg  Pulse 87  Temp(Src) 98 F (36.7 C) (Oral)  Ht 5\' 8"  (1.727 m)  Wt 219 lb (99.338 kg)  BMI 33.31 kg/m2  SpO2 98%       Objective:   Physical Exam  General Appearance- Not in acute distress.  HEENT Eyes- Scleraeral/Conjuntiva-bilat- Not Yellow. Mouth & Throat- Normal.  Chest and Lung Exam Auscultation: Breath sounds:-Normal. Adventitious sounds:- No Adventitious sounds.  Cardiovascular Auscultation:Rythm - Regular. Heart Sounds -Normal heart sounds.  Abdomen Inspection:-Inspection Normal.  Palpation/Perucssion: Palpation and Percussion of the abdomen reveal- small moderate  Tender ubmilical hernia that will reduce(but on light tough moderate to severe pain), No Rebound tenderness, No rigidity(Guarding) and No Palpable abdominal masses.  Liver:-Normal.  Spleen:- Normal.   Back- no cva pain.      Assessment & Plan:  You do appear to have small umbilical hernia. But it is quite symptomatic for size. So I am going to try to schedule you with general surgeon by Friday or early Monday.   Conservative measures to decrease pressure over area and pain.  Diclofenac for pain.  If pain increases, fever, chills, nausea, vomiting or more diffuse area of pain then ED evaluation.  Follow up with me in on week if surgeon referral delayed.

## 2015-05-09 NOTE — Progress Notes (Signed)
Pre visit review using our clinic review tool, if applicable. No additional management support is needed unless otherwise documented below in the visit note. 

## 2015-08-21 ENCOUNTER — Encounter: Payer: Self-pay | Admitting: Family Medicine

## 2015-12-18 ENCOUNTER — Encounter: Payer: Self-pay | Admitting: Family Medicine

## 2015-12-25 ENCOUNTER — Encounter: Payer: Self-pay | Admitting: Podiatry

## 2015-12-25 ENCOUNTER — Ambulatory Visit (INDEPENDENT_AMBULATORY_CARE_PROVIDER_SITE_OTHER): Payer: BLUE CROSS/BLUE SHIELD | Admitting: Podiatry

## 2015-12-25 DIAGNOSIS — M2042 Other hammer toe(s) (acquired), left foot: Secondary | ICD-10-CM | POA: Diagnosis not present

## 2015-12-25 DIAGNOSIS — B353 Tinea pedis: Secondary | ICD-10-CM | POA: Diagnosis not present

## 2015-12-25 DIAGNOSIS — Z872 Personal history of diseases of the skin and subcutaneous tissue: Secondary | ICD-10-CM | POA: Diagnosis not present

## 2015-12-25 DIAGNOSIS — M2041 Other hammer toe(s) (acquired), right foot: Secondary | ICD-10-CM

## 2015-12-25 MED ORDER — CLOTRIMAZOLE-BETAMETHASONE 1-0.05 % EX CREA: 1.0000 "application " | TOPICAL_CREAM | Freq: Two times a day (BID) | CUTANEOUS | 0 refills | Status: DC

## 2015-12-25 NOTE — Progress Notes (Signed)
   Subjective:    Patient ID: Casey Miles, male    DOB: 01/24/1966, 50 y.o.   MRN: [id number]  HPI  50 year old male presents the office they for further evaluation discussed ways to help prevent toenails and coming off as well as blistering when he goes hiking. He states that he did hike last year and the right second toenail to follow-up and to fill the blistering the left fifth toe which did end up resolving but heel to help prevent these that she's having again. Also produce a had some athlete's foot to his feet. No recent injury or trauma. No other complaints.  Review of Systems  All other systems reviewed and are negative.      Objective:   Physical Exam General: AAO x3, NAD  Dermatological: Dry, scaly, erythematous skin the left fourth interspace consistent with tinea pedis. No drainage or pus no ascending cellulitis. There is no other open lesions or pre-ulcerative lesions then applied. There is mild dystrophy to the right second digit toenail however does not overtly appear to be infected with fungus at this time.   Vascular: Dorsalis Pedis artery and Posterior Tibial artery pedal pulses are 2/4 bilateral with immedate capillary fill time.  There is no pain with calf compression, swelling, warmth, erythema.   Neruologic: Grossly intact via light touch bilateral. Vibratory intact via tuning fork bilateral. Protective threshold with Semmes Wienstein monofilament intact to all pedal sites bilateral.   Musculoskeletal: Adductovarus the fifth toes are present bilaterally. No other deformities present. No other areas of tenderness. Muscular strength 5/5 in all groups tested bilateral.  Gait: Unassisted, Nonantalgic.     Assessment & Plan:  50 year old male with history of blistering, toenail removal with tinea pedis -Treatment options discussed including all alternatives, risks, and complications -Etiology of symptoms were discussed -Discussed the nail trimming techniques as well as  I dispensed offloading pads to help prevent the tenderness from being injured as well as help in blistering. Discussed with him shoe gear modifications and sock modifications as well.  -Order Lotrisone cream for tinea pedis -Follow-up of symptoms are not resolved in the next couple weeks or sooner if any issues are to arise. Call any questions or concerns meantime.  Celesta Gentile, DPM

## 2016-06-17 ENCOUNTER — Encounter: Payer: Self-pay | Admitting: Family Medicine

## 2016-06-17 ENCOUNTER — Ambulatory Visit (INDEPENDENT_AMBULATORY_CARE_PROVIDER_SITE_OTHER): Payer: BLUE CROSS/BLUE SHIELD | Admitting: Family Medicine

## 2016-06-17 VITALS — BP 124/84 | HR 69 | Temp 98.6°F | Ht 68.0 in | Wt 204.0 lb

## 2016-06-17 DIAGNOSIS — R739 Hyperglycemia, unspecified: Secondary | ICD-10-CM | POA: Diagnosis not present

## 2016-06-17 DIAGNOSIS — G8929 Other chronic pain: Secondary | ICD-10-CM

## 2016-06-17 DIAGNOSIS — Z0000 Encounter for general adult medical examination without abnormal findings: Secondary | ICD-10-CM | POA: Diagnosis not present

## 2016-06-17 DIAGNOSIS — R7989 Other specified abnormal findings of blood chemistry: Secondary | ICD-10-CM | POA: Diagnosis not present

## 2016-06-17 DIAGNOSIS — E782 Mixed hyperlipidemia: Secondary | ICD-10-CM

## 2016-06-17 DIAGNOSIS — I10 Essential (primary) hypertension: Secondary | ICD-10-CM

## 2016-06-17 DIAGNOSIS — R945 Abnormal results of liver function studies: Secondary | ICD-10-CM

## 2016-06-17 DIAGNOSIS — E349 Endocrine disorder, unspecified: Secondary | ICD-10-CM

## 2016-06-17 DIAGNOSIS — M549 Dorsalgia, unspecified: Secondary | ICD-10-CM

## 2016-06-17 DIAGNOSIS — E559 Vitamin D deficiency, unspecified: Secondary | ICD-10-CM

## 2016-06-17 HISTORY — DX: Hyperglycemia, unspecified: R73.9

## 2016-06-17 MED ORDER — CYCLOBENZAPRINE HCL 10 MG PO TABS: 10.0000 mg | ORAL_TABLET | Freq: Three times a day (TID) | ORAL | 1 refills | Status: DC | PRN

## 2016-06-17 NOTE — Progress Notes (Signed)
Pre visit review using our clinic review tool, if applicable. No additional management support is needed unless otherwise documented below in the visit note. 

## 2016-06-17 NOTE — Assessment & Plan Note (Signed)
Take daily supplements, check level 

## 2016-06-17 NOTE — Patient Instructions (Signed)
Preventive Care 40-64 Years, Male Preventive care refers to lifestyle choices and visits with your health care provider that can promote health and wellness. What does preventive care include?  A yearly physical exam. This is also called an annual well check.  Dental exams once or twice a year.  Routine eye exams. Ask your health care provider how often you should have your eyes checked.  Personal lifestyle choices, including:  Daily care of your teeth and gums.  Regular physical activity.  Eating a healthy diet.  Avoiding tobacco and drug use.  Limiting alcohol use.  Practicing safe sex.  Taking low-dose aspirin every day starting at age 50. What happens during an annual well check? The services and screenings done by your health care provider during your annual well check will depend on your age, overall health, lifestyle risk factors, and family history of disease. Counseling  Your health care provider may ask you questions about your:  Alcohol use.  Tobacco use.  Drug use.  Emotional well-being.  Home and relationship well-being.  Sexual activity.  Eating habits.  Work and work environment. Screening  You may have the following tests or measurements:  Height, weight, and BMI.  Blood pressure.  Lipid and cholesterol levels. These may be checked every 5 years, or more frequently if you are over 50 years old.  Skin check.  Lung cancer screening. You may have this screening every year starting at age 55 if you have a 30-pack-year history of smoking and currently smoke or have quit within the past 15 years.  Fecal occult blood test (FOBT) of the stool. You may have this test every year starting at age 50.  Flexible sigmoidoscopy or colonoscopy. You may have a sigmoidoscopy every 5 years or a colonoscopy every 10 years starting at age 50.  Prostate cancer screening. Recommendations will vary depending on your family history and other risks.  Hepatitis C  blood test.  Hepatitis B blood test.  Sexually transmitted disease (STD) testing.  Diabetes screening. This is done by checking your blood sugar (glucose) after you have not eaten for a while (fasting). You may have this done every 1-3 years. Discuss your test results, treatment options, and if necessary, the need for more tests with your health care provider. Vaccines  Your health care provider may recommend certain vaccines, such as:  Influenza vaccine. This is recommended every year.  Tetanus, diphtheria, and acellular pertussis (Tdap, Td) vaccine. You may need a Td booster every 10 years.  Varicella vaccine. You may need this if you have not been vaccinated.  Zoster vaccine. You may need this after age 60.  Measles, mumps, and rubella (MMR) vaccine. You may need at least one dose of MMR if you were born in 1957 or later. You may also need a second dose.  Pneumococcal 13-valent conjugate (PCV13) vaccine. You may need this if you have certain conditions and have not been vaccinated.  Pneumococcal polysaccharide (PPSV23) vaccine. You may need one or two doses if you smoke cigarettes or if you have certain conditions.  Meningococcal vaccine. You may need this if you have certain conditions.  Hepatitis A vaccine. You may need this if you have certain conditions or if you travel or work in places where you may be exposed to hepatitis A.  Hepatitis B vaccine. You may need this if you have certain conditions or if you travel or work in places where you may be exposed to hepatitis B.  Haemophilus influenzae type b (Hib)   vaccine. You may need this if you have certain risk factors. Talk to your health care provider about which screenings and vaccines you need and how often you need them. This information is not intended to replace advice given to you by your health care provider. Make sure you discuss any questions you have with your health care provider. Document Released: 03/16/2015  Document Revised: 11/07/2015 Document Reviewed: 12/19/2014 Elsevier Interactive Patient Education  2017 [employer].

## 2016-06-17 NOTE — Progress Notes (Signed)
Patient ID: [name], male   DOB: [birth year], 51 y.o.   MRN: [id number]   Subjective:  I acted as a Education administrator for Penni Homans, [place], [place]   Patient ID: Casey Miles, male    DOB: 1965-04-10, 51 y.o.   MRN: [id number]  Chief Complaint  Patient presents with  . Annual Exam  . Hyperlipidemia  . Vitamin D Deficiency    Hyperlipidemia  This is a chronic problem. The problem is controlled. Associated symptoms include chest pain. Pertinent negatives include no shortness of breath. Risk factors for coronary artery disease include male sex.    Patient is in today for an annual examination. Patient has a Hx of Vitamin D Deficiency, hyperlipidemia. Patient has no acute concerns noted at this time. He is accompanied by his wife today. He does note he continues to have his left sided shoulder/back/chest pain but it is unchanged and tolerable. No recent febrile illness. He uses Advil when necessary and lidocaine when necessary for his back with reasonable results. No new injuries or concerns. Denies palp/SOB/HA/congestion/fevers/GI or GU c/o. Taking meds as prescribed  Patient Care Team: Mosie Lukes, MD as PCP - General   Past Medical History:  Diagnosis Date  . Abdominal pain, epigastric 09/26/2009  . Arthralgia of multiple sites 08/13/2010  . CHEST PAIN, ATYPICAL 05/04/2008  . Chest wall pain   . Chronic rhinitis 09/26/2009  . COSTOCHONDRITIS, RECURRENT 05/06/2010  . 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 Elevated BP 10/08/2011  . Elevated LFTs 06/19/2016  . Fatigue 01/08/2011  . GERD (gastroesophageal reflux disease)   . Hyperglycemia 06/17/2016  . Hyperlipidemia 10/08/2010  . Kidney stone   . NECK PAIN 09/26/2009  . Other acute reactions to stress 09/26/2009  . Other and unspecified hyperlipidemia 01/23/2013  . PARESTHESIA 11/22/2009  . Preventative health care 08/01/2012  . SHOULDER PAIN, LEFT 07/17/2008  . Testosterone deficiency 07/09/2011  . Vitamin D deficiency 07/08/2011      No past surgical history on file.  Family History  Problem Relation Age of Onset  . Diabetes Mother   . Hypertension Mother   . Hyperlipidemia Mother   . Diabetes Maternal Grandmother   . Heart disease Maternal Grandmother   . Colitis Maternal Grandmother   . Diabetes Paternal Grandmother   . Colitis Maternal Grandfather   . Heart disease Maternal Grandfather   . Heart attack Father 29  . Lupus Cousin   . Irritable bowel syndrome    . Cancer      Esophageal- [employer]  . Stomach cancer      Uncle  . Colon polyps      Grandmother  . Diabetes Other     Family history of  . Hypertension Other     Family history of  . Heart attack Other     Family history of  . Irritable bowel syndrome Other     Family history of    Social History   Social History  . Marital status: Married    Spouse name: N/A  . Number of children: N/A  . Years of education: N/A   Occupational History  . Not on file.   Social History Main Topics  . Smoking status: Never Smoker  . Smokeless tobacco: Never Used  . Alcohol use 0.0 oz/week    1 - 2 Standard drinks or equivalent per week     Comment: 1-2 per week  . Drug use: No  . Sexual activity: Yes  Comment: lives wife and children, exercises intermittently, heart healthy diet.   Other Topics Concern  . Not on file   Social History Narrative  . No narrative on file    Outpatient Medications Prior to Visit  Medication Sig Dispense Refill  . aspirin EC 81 MG tablet Take 81 mg by mouth daily.    Marland Kitchen CALCIUM-MAGNESIUM-ZINC PO Take 3 tablets by mouth daily.    . Coenzyme Q10 (CO Q-10) 100 MG CAPS Take by mouth daily.    . Cyanocobalamin (VITAMIN B-12) 1000 MCG SUBL Place 1 tablet (1,000 mcg total) under the tongue every morning.    Astrid Drafts CAPS 1 cap po daily    . niacin 500 MG tablet Take 500 mg by mouth at bedtime. 4 tablets daily    . Red Yeast Rice Extract (RED YEAST RICE PO) Take by mouth daily.    . cyclobenzaprine  (FLEXERIL) 10 MG tablet Take 1 tablet (10 mg total) by mouth 3 (three) times daily as needed for muscle spasms. 30 tablet 2  . clotrimazole-betamethasone (LOTRISONE) cream Apply 1 application topically 2 (two) times daily. (Patient not taking: Reported on 06/17/2016) 30 g 0  . diclofenac (VOLTAREN) 75 MG EC tablet Take 1 tablet (75 mg total) by mouth 2 (two) times daily. (Patient not taking: Reported on 06/17/2016) 30 tablet 0  . Testosterone (ANDROGEL PUMP) 20.25 MG/ACT (1.62%) GEL Place 40.5 mg onto the skin daily. (Patient not taking: Reported on 06/17/2016) 75 g 5   No facility-administered medications prior to visit.     No Known Allergies  Review of Systems  Constitutional: Negative for fever and malaise/fatigue.  HENT: Negative for congestion.   Eyes: Negative for blurred vision.  Respiratory: Negative for cough and shortness of breath.   Cardiovascular: Positive for chest pain. Negative for palpitations and leg swelling.  Gastrointestinal: Negative for vomiting.  Musculoskeletal: Positive for back pain and joint pain.  Skin: Negative for rash.  Neurological: Negative for loss of consciousness and headaches.       Objective:    Physical Exam  Constitutional: He is oriented to person, place, and time. He appears well-developed and well-nourished. No distress.  HENT:  Head: Normocephalic and atraumatic.  Eyes: Conjunctivae are normal.  Neck: Normal range of motion. No thyromegaly present.  Cardiovascular: Normal rate and regular rhythm.   Pulmonary/Chest: Effort normal and breath sounds normal. He has no wheezes.  Abdominal: Soft. Bowel sounds are normal. There is no tenderness.  Musculoskeletal: He exhibits no edema or deformity.  Neurological: He is alert and oriented to person, place, and time.  Skin: Skin is warm and dry. He is not diaphoretic.  Psychiatric: He has a normal mood and affect.    BP 124/84 (BP Location: Right Arm, Patient Position: Sitting, Cuff Size:  Normal)   Pulse 69   Temp 98.6 F (37 C) (Oral)   Ht 5\' 8"  (1.727 m)   Wt 204 lb (92.5 kg)   BMI 31.02 kg/m  Wt Readings from Last 3 Encounters:  06/17/16 204 lb (92.5 kg)  05/09/15 219 lb (99.3 kg)  04/09/15 221 lb 2 oz (100.3 kg)   BP Readings from Last 3 Encounters:  06/17/16 124/84  05/09/15 120/80  04/09/15 110/78     Immunization History  Administered Date(s) Administered  . Influenza Split 12/02/2011  . Influenza Whole 12/25/2010, 12/15/2012  . Influenza-Unspecified 12/23/2013, 12/04/2014  . Tdap 01/08/2011    Health Maintenance  Topic Date Due  . HIV Screening  07/29/1982  . INFLUENZA VACCINE  10/01/2016  . TETANUS/TDAP  01/07/2021    Lab Results  Component Value Date   WBC 4.8 06/17/2016   HGB 14.1 06/17/2016   HCT 41.8 06/17/2016   PLT 205.0 06/17/2016   GLUCOSE 74 06/17/2016   CHOL 203 (H) 06/17/2016   TRIG 87.0 06/17/2016   HDL 53.70 06/17/2016   LDLDIRECT 78.0 04/09/2015   LDLCALC 132 (H) 06/17/2016   ALT 29 06/17/2016   AST 41 (H) 06/17/2016   NA 139 06/17/2016   K 4.6 06/17/2016   CL 102 06/17/2016   CREATININE 1.17 06/17/2016   BUN 17 06/17/2016   CO2 28 06/17/2016   TSH 0.64 06/17/2016   INR 0.96 03/12/2010   HGBA1C 5.4 04/09/2015    Lab Results  Component Value Date   TSH 0.64 06/17/2016   Lab Results  Component Value Date   WBC 4.8 06/17/2016   HGB 14.1 06/17/2016   HCT 41.8 06/17/2016   MCV 94.0 06/17/2016   PLT 205.0 06/17/2016   Lab Results  Component Value Date   NA 139 06/17/2016   K 4.6 06/17/2016   CO2 28 06/17/2016   GLUCOSE 74 06/17/2016   BUN 17 06/17/2016   CREATININE 1.17 06/17/2016   BILITOT 0.7 06/17/2016   ALKPHOS 51 06/17/2016   AST 41 (H) 06/17/2016   ALT 29 06/17/2016   PROT 7.2 06/17/2016   ALBUMIN 4.5 06/17/2016   CALCIUM 9.7 06/17/2016   GFR 70.46 06/17/2016   Lab Results  Component Value Date   CHOL 203 (H) 06/17/2016   Lab Results  Component Value Date   HDL 53.70 06/17/2016   Lab  Results  Component Value Date   LDLCALC 132 (H) 06/17/2016   Lab Results  Component Value Date   TRIG 87.0 06/17/2016   Lab Results  Component Value Date   CHOLHDL 4 06/17/2016   Lab Results  Component Value Date   HGBA1C 5.4 04/09/2015         Assessment & Plan:   Problem List Items Addressed This Visit    Vitamin D deficiency    Take daily supplements, check level      Relevant Orders   VITAMIN D 25 Hydroxy (Vit-D Deficiency, Fractures) (Completed)   Testosterone deficiency    Only mild, check level today      Relevant Orders   Testosterone (Completed)   Preventative health care - Primary    Patient encouraged to maintain heart healthy diet, regular exercise, adequate sleep. Consider daily probiotics. Take medications as prescribed      Relevant Medications   cyclobenzaprine (FLEXERIL) 10 MG tablet   Other Relevant Orders   CBC (Completed)   Comprehensive metabolic panel (Completed)   Lipid panel (Completed)   TSH (Completed)   VITAMIN D 25 Hydroxy (Vit-D Deficiency, Fractures) (Completed)   Testosterone (Completed)   Mixed hyperlipidemia     encouraged heart healthy diet, avoid trans fats, minimize simple carbs and saturated fats. Increase exercise as tolerated      Relevant Orders   Lipid panel (Completed)   TSH (Completed)   Hyperglycemia    minimize simple carbs. Increase exercise as tolerated. Continue current meds      Relevant Orders   CBC (Completed)   Comprehensive metabolic panel (Completed)   Elevated LFTs    Mild, new likely fatty liver, will monitor patient advised to minimize simple carbohydrates and fast food, attempt modest weight loss       Other Visit Diagnoses  Chronic back pain, unspecified back location, unspecified back pain laterality       Relevant Medications   cyclobenzaprine (FLEXERIL) 10 MG tablet   Hypertension, unspecified type       Relevant Orders   CBC (Completed)   Comprehensive metabolic panel (Completed)       I have discontinued Mr. Morissette Testosterone, diclofenac, and clotrimazole-betamethasone. I am also having him maintain his CALCIUM-MAGNESIUM-ZINC PO, Vitamin B-12, Krill Oil, Red Yeast Rice Extract (RED YEAST RICE PO), Co Q-10, aspirin EC, niacin, and cyclobenzaprine.  Meds ordered this encounter  Medications  . cyclobenzaprine (FLEXERIL) 10 MG tablet    Sig: Take 1 tablet (10 mg total) by mouth 3 (three) times daily as needed for muscle spasms.    Dispense:  30 tablet    Refill:  1    CMA served as scribe during this visit. History, Physical and Plan performed by medical provider. Documentation and orders reviewed and attested to.  Penni Homans, MD

## 2016-06-17 NOTE — Assessment & Plan Note (Addendum)
Only mild, check level today

## 2016-06-18 LAB — COMPREHENSIVE METABOLIC PANEL
ALBUMIN: 4.5 g/dL (ref 3.5–5.2)
ALT: 29 U/L (ref 0–53)
AST: 41 U/L — ABNORMAL HIGH (ref 0–37)
Alkaline Phosphatase: 51 U/L (ref 39–117)
BUN: 17 mg/dL (ref 6–23)
CHLORIDE: 102 meq/L (ref 96–112)
CO2: 28 meq/L (ref 19–32)
Calcium: 9.7 mg/dL (ref 8.4–10.5)
Creatinine, Ser: 1.17 mg/dL (ref 0.40–1.50)
GFR: 70.46 mL/min (ref >60.00)
Glucose, Bld: 74 mg/dL (ref 70–99)
POTASSIUM: 4.6 meq/L (ref 3.5–5.1)
Sodium: 139 mEq/L (ref 135–145)
Total Bilirubin: 0.7 mg/dL (ref 0.2–1.2)
Total Protein: 7.2 g/dL (ref 6.0–8.3)

## 2016-06-18 LAB — LIPID PANEL
CHOLESTEROL: 203 mg/dL — AB (ref 0–200)
HDL: 53.7 mg/dL (ref >39.00)
LDL CALC: 132 mg/dL — AB (ref 0–99)
NonHDL: 149.52
Total CHOL/HDL Ratio: 4
Triglycerides: 87 mg/dL (ref 0.0–149.0)
VLDL: 17.4 mg/dL (ref 0.0–40.0)

## 2016-06-18 LAB — CBC
HEMATOCRIT: 41.8 % (ref 39.0–52.0)
Hemoglobin: 14.1 g/dL (ref 13.0–17.0)
MCHC: 33.8 g/dL (ref 30.0–36.0)
MCV: 94 fl (ref 78.0–100.0)
PLATELETS: 205 10*3/uL (ref 150.0–400.0)
RBC: 4.45 Mil/uL (ref 4.22–5.81)
RDW: 13.5 % (ref 11.5–15.5)
WBC: 4.8 10*3/uL (ref 4.0–10.5)

## 2016-06-18 LAB — VITAMIN D 25 HYDROXY (VIT D DEFICIENCY, FRACTURES): VITD: 30.78 ng/mL (ref 30.00–100.00)

## 2016-06-18 LAB — TSH: TSH: 0.64 u[IU]/mL (ref 0.35–4.50)

## 2016-06-18 LAB — TESTOSTERONE: Testosterone: 302.86 ng/dL (ref 300.00–890.00)

## 2016-06-19 ENCOUNTER — Encounter: Payer: Self-pay | Admitting: Family Medicine

## 2016-06-19 DIAGNOSIS — R945 Abnormal results of liver function studies: Secondary | ICD-10-CM

## 2016-06-19 DIAGNOSIS — R7989 Other specified abnormal findings of blood chemistry: Secondary | ICD-10-CM | POA: Insufficient documentation

## 2016-06-19 HISTORY — DX: Other specified abnormal findings of blood chemistry: R79.89

## 2016-06-19 NOTE — Assessment & Plan Note (Signed)
Patient encouraged to maintain heart healthy diet, regular exercise, adequate sleep. Consider daily probiotics. Take medications as prescribed 

## 2016-06-19 NOTE — Assessment & Plan Note (Signed)
minimize simple carbs. Increase exercise as tolerated. Continue current meds  

## 2016-06-19 NOTE — Assessment & Plan Note (Signed)
Mild, new likely fatty liver, will monitor patient advised to minimize simple carbohydrates and fast food, attempt modest weight loss

## 2016-06-19 NOTE — Assessment & Plan Note (Addendum)
encouraged heart healthy diet, avoid trans fats, minimize simple carbs and saturated fats. Increase exercise as tolerated 

## 2017-08-03 ENCOUNTER — Encounter (HOSPITAL_BASED_OUTPATIENT_CLINIC_OR_DEPARTMENT_OTHER): Payer: Self-pay | Admitting: Emergency Medicine

## 2017-08-03 ENCOUNTER — Emergency Department (HOSPITAL_BASED_OUTPATIENT_CLINIC_OR_DEPARTMENT_OTHER): Payer: BLUE CROSS/BLUE SHIELD

## 2017-08-03 ENCOUNTER — Emergency Department (HOSPITAL_BASED_OUTPATIENT_CLINIC_OR_DEPARTMENT_OTHER)
Admission: EM | Admit: 2017-08-03 | Discharge: 2017-08-03 | Disposition: A | Discharge status: Home / Self Care | Payer: BLUE CROSS/BLUE SHIELD | Attending: Emergency Medicine | Admitting: Emergency Medicine

## 2017-08-03 ENCOUNTER — Other Ambulatory Visit: Payer: Self-pay

## 2017-08-03 DIAGNOSIS — N200 Calculus of kidney: Secondary | ICD-10-CM

## 2017-08-03 DIAGNOSIS — Z7982 Long term (current) use of aspirin: Secondary | ICD-10-CM | POA: Insufficient documentation

## 2017-08-03 DIAGNOSIS — Z79899 Other long term (current) drug therapy: Secondary | ICD-10-CM | POA: Diagnosis not present

## 2017-08-03 DIAGNOSIS — R109 Unspecified abdominal pain: Secondary | ICD-10-CM | POA: Diagnosis present

## 2017-08-03 LAB — URINALYSIS, ROUTINE W REFLEX MICROSCOPIC
Glucose, UA: NEGATIVE mg/dL
KETONES UR: 15 mg/dL — AB
LEUKOCYTES UA: NEGATIVE
NITRITE: NEGATIVE
PH: 5.5 (ref 5.0–8.0)
PROTEIN: 30 mg/dL — AB
Specific Gravity, Urine: >1.03 — ABNORMAL HIGH (ref 1.005–1.030)

## 2017-08-03 LAB — CBC
HCT: 42.9 % (ref 39.0–52.0)
HEMOGLOBIN: 15.1 g/dL (ref 13.0–17.0)
MCH: 32.1 pg (ref 26.0–34.0)
MCHC: 35.2 g/dL (ref 30.0–36.0)
MCV: 91.1 fL (ref 78.0–100.0)
Platelets: 255 10*3/uL (ref 150–400)
RBC: 4.71 MIL/uL (ref 4.22–5.81)
RDW: 13 % (ref 11.5–15.5)
WBC: 8.1 10*3/uL (ref 4.0–10.5)

## 2017-08-03 LAB — BASIC METABOLIC PANEL
ANION GAP: 10 (ref 5–15)
BUN: 20 mg/dL (ref 6–20)
CALCIUM: 9 mg/dL (ref 8.9–10.3)
CO2: 24 mmol/L (ref 22–32)
Chloride: 108 mmol/L (ref 101–111)
Creatinine, Ser: 1.4 mg/dL — ABNORMAL HIGH (ref 0.61–1.24)
GFR calc Af Amer: >60 mL/min (ref >60)
GFR, EST NON AFRICAN AMERICAN: 57 mL/min — AB (ref >60)
Glucose, Bld: 157 mg/dL — ABNORMAL HIGH (ref 65–99)
POTASSIUM: 4 mmol/L (ref 3.5–5.1)
Sodium: 142 mmol/L (ref 135–145)

## 2017-08-03 LAB — HEPATIC FUNCTION PANEL
ALBUMIN: 4.3 g/dL (ref 3.5–5.0)
ALK PHOS: 58 U/L (ref 38–126)
ALT: 31 U/L (ref 17–63)
AST: 34 U/L (ref 15–41)
Bilirubin, Direct: 0.1 mg/dL (ref 0.1–0.5)
Indirect Bilirubin: 0.4 mg/dL (ref 0.3–0.9)
TOTAL PROTEIN: 7.7 g/dL (ref 6.5–8.1)
Total Bilirubin: 0.5 mg/dL (ref 0.3–1.2)

## 2017-08-03 LAB — URINALYSIS, MICROSCOPIC (REFLEX)

## 2017-08-03 MED ORDER — SODIUM CHLORIDE 0.9 % IV BOLUS
1000.0000 mL | Freq: Once | INTRAVENOUS | Status: AC
  Administered 2017-08-03: 1000 mL via INTRAVENOUS

## 2017-08-03 MED ORDER — ONDANSETRON HCL 4 MG PO TABS: 4.0000 mg | ORAL_TABLET | Freq: Three times a day (TID) | ORAL | 0 refills | Status: DC | PRN

## 2017-08-03 MED ORDER — TAMSULOSIN HCL 0.4 MG PO CAPS: 0.4000 mg | ORAL_CAPSULE | Freq: Every day | ORAL | 0 refills | Status: DC

## 2017-08-03 MED ORDER — ONDANSETRON HCL 4 MG/2ML IJ SOLN
4.0000 mg | Freq: Once | INTRAMUSCULAR | Status: AC
  Administered 2017-08-03: 4 mg via INTRAVENOUS
  Filled 2017-08-03: qty 2

## 2017-08-03 MED ORDER — OXYCODONE-ACETAMINOPHEN 5-325 MG PO TABS: 1.0000 | ORAL_TABLET | ORAL | 0 refills | Status: DC | PRN

## 2017-08-03 MED ORDER — HYDROMORPHONE HCL 1 MG/ML IJ SOLN
1.0000 mg | Freq: Once | INTRAMUSCULAR | Status: AC
  Administered 2017-08-03: 1 mg via INTRAVENOUS
  Filled 2017-08-03: qty 1

## 2017-08-03 MED ORDER — ONDANSETRON HCL 4 MG/2ML IJ SOLN
4.0000 mg | Freq: Once | INTRAMUSCULAR | Status: AC
Start: 2017-08-03 — End: 2017-08-03
  Administered 2017-08-03: 4 mg via INTRAVENOUS
  Filled 2017-08-03: qty 2

## 2017-08-03 MED ORDER — OXYCODONE-ACETAMINOPHEN 5-325 MG PO TABS
1.0000 | ORAL_TABLET | Freq: Once | ORAL | Status: AC
  Administered 2017-08-03: 1 via ORAL
  Filled 2017-08-03: qty 1

## 2017-08-03 MED ORDER — ONDANSETRON HCL 8 MG PO TABS
4.0000 mg | ORAL_TABLET | Freq: Once | ORAL | Status: AC
  Administered 2017-08-03: 4 mg via ORAL
  Filled 2017-08-03: qty 1

## 2017-08-03 MED FILL — TAMSULOSIN HCL 0.4 MG CAP: 0.4 | 14 days supply | Qty: 14 | Fill #0

## 2017-08-03 MED FILL — OXYCODONE-ACETAMINOPHEN 5-3: 5-325 | 3 days supply | Qty: 15 | Fill #0

## 2017-08-03 MED FILL — ONDANSETRON HCL 4 MG TABLET: 4 | 8 days supply | Qty: 12 | Fill #0

## 2017-08-03 NOTE — ED Triage Notes (Signed)
Patient states that he woke up this am and had a BM  He starting to have right flank pain around to his right lower abdominal area and groin soon after - Nausea noted

## 2017-08-03 NOTE — ED Provider Notes (Signed)
[place] EMERGENCY DEPARTMENT Provider Note   CSN: [id number] Arrival date & time: 08/03/17  0840     History   Chief Complaint Chief Complaint  Patient presents with  . Flank Pain    HPI Casey Miles is a 52 y.o. male.  The history is provided by the patient, the spouse and medical records.  Flank Pain  This is a recurrent problem. The current episode started 6 to 12 hours ago. The problem occurs constantly. The problem has not changed since onset.Pertinent negatives include no chest pain, no abdominal pain, no headaches and no shortness of breath. Nothing aggravates the symptoms. Nothing relieves the symptoms. He has tried nothing for the symptoms. The treatment provided no relief.    Past Medical History:  Diagnosis Date  . Abdominal pain, epigastric 09/26/2009  . Arthralgia of multiple sites 08/13/2010  . CHEST PAIN, ATYPICAL 05/04/2008  . Chest wall pain   . Chronic rhinitis 09/26/2009  . COSTOCHONDRITIS, RECURRENT 05/06/2010  . 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 Elevated BP 10/08/2011  . Elevated LFTs 06/19/2016  . Fatigue 01/08/2011  . GERD (gastroesophageal reflux disease)   . Hyperglycemia 06/17/2016  . Hyperlipidemia 10/08/2010  . Kidney stone   . NECK PAIN 09/26/2009  . Other acute reactions to stress 09/26/2009  . Other and unspecified hyperlipidemia 01/23/2013  . PARESTHESIA 11/22/2009  . Preventative health care 08/01/2012  . SHOULDER PAIN, LEFT 07/17/2008  . Testosterone deficiency 07/09/2011  . Vitamin D deficiency 07/08/2011    Patient Active Problem List   Diagnosis Date Noted  . Elevated LFTs 06/19/2016  . Hyperglycemia 06/17/2016  . Mixed hyperlipidemia 01/23/2013  . Preventative health care 08/01/2012  . Elevated BP 10/08/2011  . Testosterone deficiency 07/09/2011  . Vitamin D deficiency 07/08/2011  . Fatigue 01/08/2011  . Arthralgia of multiple sites 08/13/2010  . COSTOCHONDRITIS, RECURRENT 05/06/2010  . PARESTHESIA  11/22/2009  . OTHER ACUTE REACTIONS TO STRESS 09/26/2009  . CHRONIC RHINITIS 09/26/2009  . NECK PAIN 09/26/2009  . ABDOMINAL PAIN, EPIGASTRIC 09/26/2009  . SHOULDER PAIN, LEFT 07/17/2008  . 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 CHEST PAIN, ATYPICAL 05/04/2008    History reviewed. No pertinent surgical history.      Home Medications    Prior to Admission medications   Medication Sig Start Date End Date Taking? Authorizing Provider  aspirin EC 81 MG tablet Take 81 mg by mouth daily.    [provider]  CALCIUM-MAGNESIUM-ZINC PO Take 3 tablets by mouth daily.    [provider]  Coenzyme Q10 (CO Q-10) 100 MG CAPS Take by mouth daily.    [provider]  Cyanocobalamin (VITAMIN B-12) 1000 MCG SUBL Place 1 tablet (1,000 mcg total) under the tongue every morning. 10/08/11   Mosie Lukes, MD  cyclobenzaprine (FLEXERIL) 10 MG tablet Take 1 tablet (10 mg total) by mouth 3 (three) times daily as needed for muscle spasms. 06/17/16   Mosie Lukes, MD  Astrid Drafts CAPS 1 cap po daily 04/23/12   Mosie Lukes, MD  niacin 500 MG tablet Take 500 mg by mouth at bedtime. 4 tablets daily    [provider]  Red Yeast Rice Extract (RED YEAST RICE PO) Take by mouth daily.    [provider]    Family History Family History  Problem Relation Age of Onset  . Diabetes Mother   . Hypertension Mother   . Hyperlipidemia Mother   . Diabetes Maternal Grandmother   .  Heart disease Maternal Grandmother   . Colitis Maternal Grandmother   . Diabetes Paternal Grandmother   . Colitis Maternal Grandfather   . Heart disease Maternal Grandfather   . Heart attack Father 29  . Lupus Cousin   . Irritable bowel syndrome Unknown   . Cancer Unknown        Esophageal- Great Grandfather  . Stomach cancer Unknown        Uncle  . Colon polyps Unknown        Grandmother  . Diabetes Other        Family history of  . Hypertension Other        Family  history of  . Heart attack Other        Family history of  . Irritable bowel syndrome Other        Family history of    Social History Social History   Tobacco Use  . Smoking status: Never Smoker  . Smokeless tobacco: Never Used  Substance Use Topics  . Alcohol use: Yes    Alcohol/week: 0.0 oz    Types: 1 - 2 Standard drinks or equivalent per week    Comment: 1-2 per week  . Drug use: No     Allergies   Patient has no known allergies.   Review of Systems Review of Systems  Constitutional: Negative for chills, diaphoresis, fatigue and fever.  HENT: Negative for congestion and rhinorrhea.   Eyes: Negative for visual disturbance.  Respiratory: Negative for cough, chest tightness and shortness of breath.   Cardiovascular: Negative for chest pain.  Gastrointestinal: Positive for nausea. Negative for abdominal pain, constipation, diarrhea and vomiting.  Genitourinary: Positive for flank pain. Negative for dysuria, frequency, penile pain and testicular pain.  Musculoskeletal: Positive for back pain. Negative for neck pain and neck stiffness.  Neurological: Negative for headaches.  Psychiatric/Behavioral: Negative for agitation.  All other systems reviewed and are negative.    Physical Exam Updated Vital Signs BP 129/87 (BP Location: Left Arm)   Pulse 60   Temp 97.6 F (36.4 C) (Oral)   Resp 20   Ht 5\' 8"  (1.727 m)   Wt 102.1 kg (225 lb)   BMI 34.21 kg/m   Physical Exam  Constitutional: He is oriented to person, place, and time. He appears well-developed and well-nourished. No distress.  HENT:  Head: Normocephalic and atraumatic.  Nose: Nose normal.  Mouth/Throat: Oropharynx is clear and moist. No oropharyngeal exudate.  Eyes: Pupils are equal, round, and reactive to light.  Neck: Normal range of motion.  Cardiovascular: Normal rate and intact distal pulses.  No murmur heard. Pulmonary/Chest: Effort normal. No stridor. He has no wheezes. He exhibits no  tenderness.  Abdominal: Soft. Normal appearance and bowel sounds are normal. There is tenderness. There is no rigidity, no rebound, no guarding and no CVA tenderness.    Genitourinary: Penis normal. No penile tenderness.  Musculoskeletal: He exhibits tenderness. He exhibits no edema.  Neurological: He is alert and oriented to person, place, and time. No sensory deficit. He exhibits normal muscle tone.  Skin: Capillary refill takes less than 2 seconds. He is not diaphoretic. No erythema. No pallor.  Psychiatric: He has a normal mood and affect.  Nursing note and vitals reviewed.    ED Treatments / Results  Labs (all labs ordered are listed, but only abnormal results are displayed) Labs Reviewed  URINALYSIS, ROUTINE W REFLEX MICROSCOPIC - Abnormal; Notable for the following components:      Result  Value   Color, Urine AMBER (*)    Specific Gravity, Urine >1.030 (*)    Hgb urine dipstick LARGE (*)    Bilirubin Urine SMALL (*)    Ketones, ur 15 (*)    Protein, ur 30 (*)    All other components within normal limits  BASIC METABOLIC PANEL - Abnormal; Notable for the following components:   Glucose, Bld 157 (*)    Creatinine, Ser 1.40 (*)    GFR calc non Af Amer 57 (*)    All other components within normal limits  URINALYSIS, MICROSCOPIC (REFLEX) - Abnormal; Notable for the following components:   Bacteria, UA MANY (*)    All other components within normal limits  URINE CULTURE  CBC  HEPATIC FUNCTION PANEL    EKG None  Radiology Ct Renal Stone Study  Result Date: 08/03/2017 CLINICAL DATA:  Right flank pain and nausea beginning this morning. EXAM: CT ABDOMEN AND PELVIS WITHOUT CONTRAST TECHNIQUE: Multidetector CT imaging of the abdomen and pelvis was performed following the standard protocol without IV contrast. COMPARISON:  CT abdomen and pelvis 02/27/2009. FINDINGS: Lower chest: Calcific coronary artery disease is noted. Heart size is normal. Lung bases are clear. Hepatobiliary:  1.9 cm cyst is seen in the dome of the liver, unchanged. The liver is low attenuating consistent with fatty infiltration. The liver otherwise appears normal. Gallbladder and biliary tree are unremarkable. Pancreas: Unremarkable. No pancreatic ductal dilatation or surrounding inflammatory changes. Spleen: Normal in size without focal abnormality. Adrenals/Urinary Tract: The adrenal glands appear normal. There is mild right hydronephrosis with some stranding about the right kidney and ureter due to a 0.3 cm distal right ureteral stone. The stone is just proximal to the UVJ. No other urinary tract stones are identified. The urinary bladder and adrenal glands appear normal. Stomach/Bowel: Stomach is within normal limits. Appendix appears normal. No evidence of bowel wall thickening, distention, or inflammatory changes. Vascular/Lymphatic: Retroaortic left renal vein incidentally noted. No aneurysm or calcific aortic atherosclerosis. No lymphadenopathy. Reproductive: Prostate is unremarkable. Other: No fluid collection.  No hernia. Musculoskeletal: No acute or focal bony abnormality. Facet arthropathy L4-5 and L5-S1 noted. IMPRESSION: Mild right hydronephrosis due to a 0.3 cm stone just proximal to the UVJ. No other urine tract stones are identified. No other acute abnormality. Fatty infiltration of the liver. Single calcific coronary atherosclerotic calcification is identified. Electronically Signed   By: Inge Rise M.D.   On: 08/03/2017 10:04    Procedures Procedures (including critical care time)  Medications Ordered in ED Medications  HYDROmorphone (DILAUDID) injection 1 mg (1 mg Intravenous Given 08/03/17 0935)  ondansetron (ZOFRAN) injection 4 mg (4 mg Intravenous Given 08/03/17 0935)  HYDROmorphone (DILAUDID) injection 1 mg (1 mg Intravenous Given 08/03/17 1109)  ondansetron (ZOFRAN) injection 4 mg (4 mg Intravenous Given 08/03/17 1109)  sodium chloride 0.9 % bolus 1,000 mL (1,000 mLs Intravenous New  Bag/Given 08/03/17 1129)     Initial Impression / Assessment and Plan / ED Course  I have reviewed the triage vital signs and the nursing notes.  Pertinent labs & imaging results that were available during my care of the patient were reviewed by me and considered in my medical decision making (see chart for details).     Casey Miles is a 52 y.o. male past medical history significant for GERD, hypertension, hyperlipidemia, prior elevated liver function, and kidney stones who presents with right flank pain.  Patient reports that he has pain that he thinks feels like prior  kidney stones.  He reports that it is sharp and intermittent and in his right CVA area going into his right flank and right lower quadrant.  He reports it went briefly into his groin but that has resolved.  He denies testicle pain or swelling.  He denies history of inguinal hernias but does report an umbilical hernia history.  That is nontender.  He reports some nausea but no vomiting.  He denies fevers, chills, chest pain, shortness of breath, palpitations.  He denies recent trauma or injuries.  He does report that he hiked last month a long distance and may have been dehydrated.  On exam, patient has tenderness in his right flank.  No right upper quadrant tenderness or right lower quadrant tenderness.  Lungs clear chest nontender.  Back nontender.  GU exam had no testicle tenderness or inguinal hernia palpated.  Normal penis and scrotum.  Clinically I am concerned for kidney stone.  Given his history, patient will have CT stone study to look as well as laboratory testing to further evaluate.  Urinalysis will be collected to look for infection or pyelonephritis/stone.    Patient given pain medicine nausea medicine during initial work-up.  Anticipate reassessment.  CT scan revealed right-sided kidney stone is 3 mm and at the UVJ.  This is likely the cause of the symptoms.  Next para if no evidence of infected stone.  Creatinine  slightly elevated, fluids given.  Next  Patient was able to tolerate p.o. and pain was controlled.  Patient be discharged with pain medicine, nausea medicine and Flomax.  Suspect patient will be stable for medical expulsion therapy at home.  Next  Patient will follow with PCP and his urologist from previous stone if it continues.  Patient was understanding return precautions and was discharged in good condition.   Final Clinical Impressions(s) / ED Diagnoses   Final diagnoses:  Right kidney stone    ED Discharge Orders        Ordered    tamsulosin (FLOMAX) 0.4 MG CAPS capsule  Daily     08/03/17 1237    ondansetron (ZOFRAN) 4 MG tablet  Every 8 hours PRN     08/03/17 1237    oxyCODONE-acetaminophen (PERCOCET/ROXICET) 5-325 MG tablet  Every 4 hours PRN     08/03/17 1237      Clinical Impression: 1. Right kidney stone     Disposition: Discharge  Condition: Good  I have discussed the results, Dx and Tx plan with the pt(& family if present). He/she/they expressed understanding and agree(s) with the plan. Discharge instructions discussed at great length. Strict return precautions discussed and pt &/or family have verbalized understanding of the instructions. No further questions at time of discharge.    New Prescriptions   ONDANSETRON (ZOFRAN) 4 MG TABLET    Take 1 tablet (4 mg total) by mouth every 8 (eight) hours as needed for nausea or vomiting.   OXYCODONE-ACETAMINOPHEN (PERCOCET/ROXICET) 5-325 MG TABLET    Take 1 tablet by mouth every 4 (four) hours as needed for severe pain.   TAMSULOSIN (FLOMAX) 0.4 MG CAPS CAPSULE    Take 1 capsule (0.4 mg total) by mouth daily.    Follow Up: Mosie Lukes, MD [address] [place] [address] [phone]     [facility] [place] [facility] DEPART[facility] [address] [id number] YQ [facility] [place] [phone]       Tegeler, Gwenyth Allegra, MD 08/03/17 [phone]

## 2017-08-03 NOTE — ED Notes (Addendum)
Patient notified urine sample needed.  

## 2017-08-03 NOTE — Discharge Instructions (Signed)
Your work-up today showed evidence of kidney stone.  There is no evidence of infection.  Your kidney function was slightly elevated which is why you receive the fluids.  Please stay hydrated and follow-up with your primary doctor for reassessment.  Please use the pain medicine, nausea medicine, and Flomax to help medically treat and pass her stone.  If any symptoms change or worsen or you cannot stay hydrated, please return to the nearest emergency department.

## 2017-08-04 ENCOUNTER — Encounter (HOSPITAL_BASED_OUTPATIENT_CLINIC_OR_DEPARTMENT_OTHER): Payer: Self-pay | Admitting: *Deleted

## 2017-08-04 ENCOUNTER — Other Ambulatory Visit: Payer: Self-pay

## 2017-08-04 ENCOUNTER — Ambulatory Visit: Payer: Self-pay | Admitting: *Deleted

## 2017-08-04 ENCOUNTER — Emergency Department (HOSPITAL_BASED_OUTPATIENT_CLINIC_OR_DEPARTMENT_OTHER)
Admission: EM | Admit: 2017-08-04 | Discharge: 2017-08-05 | Disposition: A | Discharge status: Home / Self Care | Payer: BLUE CROSS/BLUE SHIELD | Attending: Emergency Medicine | Admitting: Emergency Medicine

## 2017-08-04 DIAGNOSIS — R109 Unspecified abdominal pain: Secondary | ICD-10-CM | POA: Diagnosis present

## 2017-08-04 DIAGNOSIS — Z79899 Other long term (current) drug therapy: Secondary | ICD-10-CM | POA: Diagnosis not present

## 2017-08-04 DIAGNOSIS — N201 Calculus of ureter: Secondary | ICD-10-CM | POA: Insufficient documentation

## 2017-08-04 DIAGNOSIS — Z7982 Long term (current) use of aspirin: Secondary | ICD-10-CM | POA: Diagnosis not present

## 2017-08-04 DIAGNOSIS — R112 Nausea with vomiting, unspecified: Secondary | ICD-10-CM | POA: Insufficient documentation

## 2017-08-04 LAB — URINE CULTURE: CULTURE: NO GROWTH

## 2017-08-04 MED ORDER — HYDROMORPHONE HCL 1 MG/ML IJ SOLN
1.0000 mg | Freq: Once | INTRAMUSCULAR | Status: AC
  Administered 2017-08-05: 1 mg via INTRAVENOUS
  Filled 2017-08-04: qty 1

## 2017-08-04 MED ORDER — ONDANSETRON HCL 4 MG/2ML IJ SOLN
4.0000 mg | Freq: Once | INTRAMUSCULAR | Status: AC
  Administered 2017-08-04: 4 mg via INTRAVENOUS
  Filled 2017-08-04: qty 2

## 2017-08-04 MED ORDER — KETOROLAC TROMETHAMINE 15 MG/ML IJ SOLN
15.0000 mg | Freq: Once | INTRAMUSCULAR | Status: AC
  Administered 2017-08-05: 15 mg via INTRAVENOUS
  Filled 2017-08-04: qty 1

## 2017-08-04 NOTE — ED Triage Notes (Signed)
Right flank pain. States he was seen here yesterday for a kidney stone. Pain came back at 3pm today. No relief with Oxycodone and Zofran. He repeated the Oxycodone at 7pm with still no relief.

## 2017-08-04 NOTE — Telephone Encounter (Signed)
Pt seen in ED on yesterday and was diagnosed as having a 56ml kidney stones. Pt states he was told that the stone was at the entrance way to the urethra and should pass soon. Pt calling today with complaints of pain to the lower back and belly that returned around 3pm today. Pt currently rating pain at 8-9. Pt states he did take 1 oxycodone at 3pm which was prescribed in the ED along with medication for nausea. Pt states he has been able to urinate without difficulty and does not feel like his bladder is full, but states that his abdomen is distended. Pt states he has been drinking increased amounts of water and his urine has been clear in color.  Pt advised to take another dose of pain medication since it was prescribed to take every 4 hours prn.Pt advised to seek care in the ED if symptoms did not improve after taking pain medication. Pt verbalized understanding.  Reason for Disposition . [1] SEVERE pain (e.g., excruciating, scale 8-10) AND [2] present > 1 hour  Answer Assessment - Initial Assessment Questions 1. LOCATION: "Where does it hurt?" (e.g., left, right)     Lower back and belly feels distended 2. ONSET: "When did the pain start?"     About 3pm this afternoon 3. SEVERITY: "How bad is the pain?" (e.g., Scale 1-10; mild, moderate, or severe)   - MILD (1-3): doesn't interfere with normal activities    - MODERATE (4-7): interferes with normal activities or awakens from sleep    - SEVERE (8-10): excruciating pain and patient unable to do normal activities (stays in bed)       8-9 4. PATTERN: "Does the pain come and go, or is it constant?"      constang 5. CAUSE: "What do you think is causing the pain?"     Pt has a kidney stone 6. OTHER SYMPTOMS:  "Do you have any other symptoms?" (e.g., fever, abdominal pain, vomiting, leg weakness, burning with urination, blood in urine)     Abdominal pain 7. PREGNANCY:  "Is there any chance you are pregnant?" "When was your last menstrual period?"  n/a  Protocols used: FLANK PAIN-A-AH

## 2017-08-05 LAB — URINALYSIS, MICROSCOPIC (REFLEX)

## 2017-08-05 LAB — URINALYSIS, ROUTINE W REFLEX MICROSCOPIC
BILIRUBIN URINE: NEGATIVE
Glucose, UA: NEGATIVE mg/dL
Ketones, ur: NEGATIVE mg/dL
Leukocytes, UA: NEGATIVE
NITRITE: NEGATIVE
PH: 6 (ref 5.0–8.0)
Protein, ur: NEGATIVE mg/dL
SPECIFIC GRAVITY, URINE: 1.02 (ref 1.005–1.030)

## 2017-08-05 MED ORDER — SODIUM CHLORIDE 0.9 % IV BOLUS
1000.0000 mL | Freq: Once | INTRAVENOUS | Status: AC
  Administered 2017-08-04: 1000 mL via INTRAVENOUS

## 2017-08-05 MED ORDER — HYDROMORPHONE HCL 2 MG PO TABS: 2.0000 mg | ORAL_TABLET | ORAL | 0 refills | Status: DC | PRN

## 2017-08-05 NOTE — Telephone Encounter (Signed)
Just check in with him and make sure he has follow up care with urology already set if no we can place a referral

## 2017-08-05 NOTE — Telephone Encounter (Signed)
See ER note from 08/04/17. I thinks this has been addressed.

## 2017-08-05 NOTE — ED Provider Notes (Signed)
[facility] Provider Note: Georgena Spurling, MD, FACEP  CSN: [id number] MRN: [id number] ARRIVAL: 08/04/17 at 2218 ROOM: [place]  Flank Pain   HISTORY OF PRESENT ILLNESS  08/05/17 1:28 AM Casey Miles is a 52 y.o. male who was diagnosed with ureterolithiasis the day before yesterday.  He was discharged on Percocet 5/325 1 tablet every 4 hours as needed as well as Zofran and Flomax.  He returns with severe right flank pain that returned yesterday afternoon accompanied by nausea and vomiting.  It has been uncontrolled by oxycodone at 3 PM and 7 PM yesterday.  He describes it as feeling like spasms.  He was given Dilaudid and Toradol IV prior to my evaluation with significant relief of his pain and he is now comfortable.   Past Medical History:  Diagnosis Date  . Abdominal pain, epigastric 09/26/2009  . Arthralgia of multiple sites 08/13/2010  . CHEST PAIN, ATYPICAL 05/04/2008  . Chest wall pain   . Chronic rhinitis 09/26/2009  . COSTOCHONDRITIS, RECURRENT 05/06/2010  . 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 Elevated BP 10/08/2011  . Elevated LFTs 06/19/2016  . Fatigue 01/08/2011  . GERD (gastroesophageal reflux disease)   . Hyperglycemia 06/17/2016  . Hyperlipidemia 10/08/2010  . Kidney stone   . NECK PAIN 09/26/2009  . Other acute reactions to stress 09/26/2009  . Other and unspecified hyperlipidemia 01/23/2013  . PARESTHESIA 11/22/2009  . Preventative health care 08/01/2012  . SHOULDER PAIN, LEFT 07/17/2008  . Testosterone deficiency 07/09/2011  . Vitamin D deficiency 07/08/2011    History reviewed. No pertinent surgical history.  Family History  Problem Relation Age of Onset  . Diabetes Mother   . Hypertension Mother   . Hyperlipidemia Mother   . Diabetes Maternal Grandmother   . Heart disease Maternal Grandmother   . Colitis Maternal Grandmother   . Diabetes Paternal Grandmother   . Colitis Maternal Grandfather   . Heart disease Maternal  Grandfather   . Heart attack Father 39  . Lupus Cousin   . Irritable bowel syndrome Unknown   . Cancer Unknown        Esophageal- Great Grandfather  . Stomach cancer Unknown        Uncle  . Colon polyps Unknown        Grandmother  . Diabetes Other        Family history of  . Hypertension Other        Family history of  . Heart attack Other        Family history of  . Irritable bowel syndrome Other        Family history of    Social History   Tobacco Use  . Smoking status: Never Smoker  . Smokeless tobacco: Never Used  Substance Use Topics  . Alcohol use: Yes    Alcohol/week: 0.0 oz    Types: 1 - 2 Standard drinks or equivalent per week    Comment: 1-2 per week  . Drug use: No    Prior to Admission medications   Medication Sig Start Date End Date Taking? Authorizing Provider  aspirin EC 81 MG tablet Take 81 mg by mouth daily.    [provider]  CALCIUM-MAGNESIUM-ZINC PO Take 3 tablets by mouth daily.    [provider]  Coenzyme Q10 (CO Q-10) 100 MG CAPS Take by mouth daily.    [provider]  Cyanocobalamin (VITAMIN B-12) 1000 MCG SUBL Place 1 tablet (1,000  mcg total) under the tongue every morning. 10/08/11   Mosie Lukes, MD  cyclobenzaprine (FLEXERIL) 10 MG tablet Take 1 tablet (10 mg total) by mouth 3 (three) times daily as needed for muscle spasms. 06/17/16   Mosie Lukes, MD  Astrid Drafts CAPS 1 cap po daily 04/23/12   Mosie Lukes, MD  niacin 500 MG tablet Take 500 mg by mouth at bedtime. 4 tablets daily    [provider]  ondansetron (ZOFRAN) 4 MG tablet Take 1 tablet (4 mg total) by mouth every 8 (eight) hours as needed for nausea or vomiting. 08/03/17   Tegeler, Gwenyth Allegra, MD  oxyCODONE-acetaminophen (PERCOCET/ROXICET) 5-325 MG tablet Take 1 tablet by mouth every 4 (four) hours as needed for severe pain. 08/03/17   Tegeler, Gwenyth Allegra, MD  Red Yeast Rice Extract (RED YEAST RICE PO) Take by mouth daily.    [provider]  tamsulosin (FLOMAX) 0.4 MG CAPS capsule Take 1 capsule (0.4 mg total) by mouth daily. 08/03/17   Tegeler, Gwenyth Allegra, MD    Allergies Patient has no known allergies.   REVIEW OF SYSTEMS  Negative except as noted here or in the History of Present Illness.   PHYSICAL EXAMINATION  Initial Vital Signs Blood pressure (!) 108/58, pulse 80, temperature 98.4 F (36.9 C), temperature source Oral, resp. rate 18, height 5\' 8"  (1.727 m), weight 102.1 kg (225 lb), SpO2 100 %.  Examination General: Well-developed, well-nourished male in no acute distress; appearance consistent with age of record HENT: normocephalic; atraumatic Eyes: pupils equal, round and reactive to light; extraocular muscles intact Neck: supple Heart: regular rate and rhythm Lungs: clear to auscultation bilaterally Abdomen: soft; nondistended; nontender; bowel sounds present GU: No CVA tenderness Extremities: No deformity; full range of motion; pulses normal Neurologic: Awake, alert and oriented; motor function intact in all extremities and symmetric; no facial droop Skin: Warm and dry Psychiatric: Normal mood and affect   RESULTS  Summary of this visit's results, reviewed by myself:   EKG Interpretation  Date/Time:    Ventricular Rate:    PR Interval:    QRS Duration:   QT Interval:    QTC Calculation:   R Axis:     Text Interpretation:        Laboratory Studies: Results for orders placed or performed during the hospital encounter of 08/04/17 (from the past 24 hour(s))  Urinalysis, Routine w reflex microscopic     Status: Abnormal   Collection Time: 08/05/17 12:01 AM  Result Value Ref Range   Color, Urine YELLOW YELLOW   APPearance CLEAR CLEAR   Specific Gravity, Urine 1.020 1.005 - 1.030   pH 6.0 5.0 - 8.0   Glucose, UA NEGATIVE NEGATIVE mg/dL   Hgb urine dipstick TRACE (A) NEGATIVE   Bilirubin Urine NEGATIVE NEGATIVE   Ketones, ur NEGATIVE NEGATIVE mg/dL   Protein, ur NEGATIVE  NEGATIVE mg/dL   Nitrite NEGATIVE NEGATIVE   Leukocytes, UA NEGATIVE NEGATIVE  Urinalysis, Microscopic (reflex)     Status: Abnormal   Collection Time: 08/05/17 12:01 AM  Result Value Ref Range   RBC / HPF 0-5 0 - 5 RBC/hpf   WBC, UA 0-5 0 - 5 WBC/hpf   Bacteria, UA RARE (A) NONE SEEN   Squamous Epithelial / LPF 0-5 0 - 5   Imaging Studies: Ct Renal Stone Study  Result Date: 08/03/2017 CLINICAL DATA:  Right flank pain and nausea beginning this morning. EXAM: CT ABDOMEN AND PELVIS WITHOUT CONTRAST TECHNIQUE: Multidetector  CT imaging of the abdomen and pelvis was performed following the standard protocol without IV contrast. COMPARISON:  CT abdomen and pelvis 02/27/2009. FINDINGS: Lower chest: Calcific coronary artery disease is noted. Heart size is normal. Lung bases are clear. Hepatobiliary: 1.9 cm cyst is seen in the dome of the liver, unchanged. The liver is low attenuating consistent with fatty infiltration. The liver otherwise appears normal. Gallbladder and biliary tree are unremarkable. Pancreas: Unremarkable. No pancreatic ductal dilatation or surrounding inflammatory changes. Spleen: Normal in size without focal abnormality. Adrenals/Urinary Tract: The adrenal glands appear normal. There is mild right hydronephrosis with some stranding about the right kidney and ureter due to a 0.3 cm distal right ureteral stone. The stone is just proximal to the UVJ. No other urinary tract stones are identified. The urinary bladder and adrenal glands appear normal. Stomach/Bowel: Stomach is within normal limits. Appendix appears normal. No evidence of bowel wall thickening, distention, or inflammatory changes. Vascular/Lymphatic: Retroaortic left renal vein incidentally noted. No aneurysm or calcific aortic atherosclerosis. No lymphadenopathy. Reproductive: Prostate is unremarkable. Other: No fluid collection.  No hernia. Musculoskeletal: No acute or focal bony abnormality. Facet arthropathy L4-5 and L5-S1  noted. IMPRESSION: Mild right hydronephrosis due to a 0.3 cm stone just proximal to the UVJ. No other urine tract stones are identified. No other acute abnormality. Fatty infiltration of the liver. Single calcific coronary atherosclerotic calcification is identified. Electronically Signed   By: Inge Rise M.D.   On: 08/03/2017 10:04    ED COURSE and MDM  Nursing notes and initial vitals signs, including pulse oximetry, reviewed.  Vitals:   08/04/17 2222 08/04/17 2223 08/05/17 0049  BP:  117/83 (!) 108/58  Pulse:  76 80  Resp:  16 18  Temp:  98.4 F (36.9 C)   TempSrc: Oral Oral   SpO2:  94% 100%  Weight: 102.1 kg (225 lb)    Height: 5\' 8"  (1.727 m)     We will switch the patient from Percocet to Dilaudid as he seems to respond better to Dilaudid.  Consultation with the [facility] state controlled substances database reveals the patient has received 1 prescription for oxycodone/acetaminophen 15 tablets prescribed 2 days ago and no others in the past 2 years.   PROCEDURES    ED DIAGNOSES     ICD-10-CM   1. Ureterolithiasis N20.1        Korah Hufstedler, MD 08/05/17 [phone]

## 2017-08-06 NOTE — Telephone Encounter (Signed)
Called patient left vmail for patient to call the office back  

## 2017-08-07 ENCOUNTER — Ambulatory Visit: Payer: Self-pay

## 2017-08-07 NOTE — Telephone Encounter (Signed)
Pt's wife calling to report pt's constipation and smears of bright red blood on stool surface. Pt has had 2 stools with blood noted. Pt noted this today.  Pt has a kidney stone and has been taking Percocet or Dilaudid for the kidney pain. Pt has not had a BM since Monday until today. Pt states that the stool is like "rabbit pellets." Pt states that he is having cramping before each attempt to have a BM. Pt denies straining with BM's. Pt has been eating less because of the kidney stone pain. Pt has tried [employer] research (medical) plus" (stool softener and laxative). Home care advice given for both rectal bleeding and constipation.  Reason for Disposition . [1] Normal formed BM AND [2] few streaks or drops of blood on surface of BM . [1] Rectal pain or fullness from fecal impaction (rectum full of stool) AND [2] has not tried SITZ bath, suppository or enema  Answer Assessment - Initial Assessment Questions 1. APPEARANCE of BLOOD: "What color is it?" "Is it passed separately, on the surface of the stool, or mixed in with the stool?"      Bright red-surface of the stool 2. AMOUNT: "How much blood was passed?"      smear 3. FREQUENCY: "How many times has blood been passed with the stools?"      2 4. ONSET: "When was the blood first seen in the stools?" (Days or weeks)      today 5. DIARRHEA: "Is there also some diarrhea?" If so, ask: "How many diarrhea stools were passed in past 24 hours?"      no 6. CONSTIPATION: "Do you have constipation?" If so, "How bad is it?"     Yes- has not had a BM since Monday 7. RECURRENT SYMPTOMS: "Have you had blood in your stools before?" If so, ask: "When was the last time?" and "What happened that time?"      no 8. BLOOD THINNERS: "Do you take any blood thinners?" (e.g., Coumadin/warfarin, Pradaxa/dabigatran, aspirin)     no 9. OTHER SYMPTOMS: "Do you have any other symptoms?"  (e.g., abdominal pain, vomiting, dizziness, fever)     Abdominal cramping and bloating 10. PREGNANCY:  "Is there any chance you are pregnant?" "When was your last menstrual period?"       n/a  Answer Assessment - Initial Assessment Questions 1. STOOL PATTERN OR FREQUENCY: "How often do you pass bowel movements (BMs)?"  (Normal range: tid to q 3 days)  "When was the last BM passed?"       daily 2. STRAINING: "Do you have to strain to have a BM?"      no 3. RECTAL PAIN: "Does your rectum hurt when the stool comes out?" If so, ask: "Do you have hemorrhoids? How bad is the pain?"  (Scale 1-10; or mild, moderate, severe)     no 4. STOOL COMPOSITION: "Are the stools hard?"      yes 5. BLOOD ON STOOLS: "Has there been any blood on the toilet tissue or on the surface of the BM?" If so, ask: "When was the last time?"      Yes today 6. CHRONIC CONSTIPATION: "Is this a new problem for you?"  If no, ask: How long have you had this problem?" (days, weeks, months)      Yes 1 week Monday 7. CHANGES IN DIET: "Have there been any recent changes in your diet?"      Eating less 8. MEDICATIONS: "Have you been taking any new medications?"  Dilaudid, percocet, ibuprofen 9. LAXATIVES: "Have you been using any laxatives or enemas?"  If yes, ask "What, how often, and when was the last time?"  sennakot plus stool softener and laxative 10. CAUSE: "What do you think is causing the constipation?"    Pain meds 11. OTHER SYMPTOMS: "Do you have any other symptoms?" (e.g., abdominal pain, fever, vomiting)       Cramping and belly bloated 12. PREGNANCY: "Is there any chance you are pregnant?" "When was your last menstrual period?"   n/a  Protocols used: RECTAL BLEEDING-A-AH, CONSTIPATION-A-AH

## 2017-10-05 ENCOUNTER — Ambulatory Visit: Payer: BLUE CROSS/BLUE SHIELD | Admitting: Family Medicine

## 2018-07-16 ENCOUNTER — Encounter: Payer: Self-pay | Admitting: Family Medicine

## 2018-07-20 ENCOUNTER — Other Ambulatory Visit: Payer: Self-pay | Admitting: Family Medicine

## 2018-07-20 DIAGNOSIS — R000 Tachycardia, unspecified: Secondary | ICD-10-CM

## 2018-07-29 ENCOUNTER — Telehealth: Payer: Self-pay

## 2018-07-29 NOTE — Telephone Encounter (Signed)
YOUR CARDIOLOGY TEAM HAS ARRANGED FOR AN E-VISIT FOR YOUR APPOINTMENT - PLEASE REVIEW IMPORTANT INFORMATION BELOW SEVERAL DAYS PRIOR TO YOUR APPOINTMENT  Due to the recent COVID-19 pandemic, we are transitioning in-person office visits to tele-medicine visits in an effort to decrease unnecessary exposure to our patients, their families, and staff. These visits are billed to your insurance just like a normal visit is. We also encourage you to sign up for MyChart if you have not already done so. You will need a smartphone if possible. For patients that do not have this, we can still complete the visit using a regular telephone but do prefer a smartphone to enable video when possible. You may have a family member that lives with you that can help. If possible, we also ask that you have a blood pressure cuff and scale at home to measure your blood pressure, heart rate and weight prior to your scheduled appointment. Patients with clinical needs that need an in-person evaluation and testing will still be able to come to the office if absolutely necessary. If you have any questions, feel free to call our office.     YOUR PROVIDER WILL BE USING THE FOLLOWING PLATFORM TO COMPLETE YOUR VISIT: [email or web address]  . IF USING MYCHART - How to Download the MyChart App to Your SmartPhone   - If Apple, go to App Store and type in MyChart in the search bar and download the app. If Android, ask patient to go to Google Play Store and type in MyChart in the search bar and download the app. The app is free but as with any other app downloads, your phone may require you to verify saved payment information or Apple/Android password.  - You will need to then log into the app with your MyChart username and password, and select [facility] as your healthcare provider to link the account.  - When it is time for your visit, go to the MyChart app, find appointments, and click Begin Video Visit. Be sure to Select Allow for your device to  access the Microphone and Camera for your visit. You will then be connected, and your provider will be with you shortly.  **If you have any issues connecting or need assistance, please contact MyChart service desk [phone] ([phone])**  **If using a computer, in order to ensure the best quality for your visit, you will need to use either of the following Internet Browsers: Google Chrome or Microsoft Edge**  . IF USING DOXIMITY or [email or web address] - The staff will give you instructions on receiving your link to join the meeting the day of your visit.      2-3 DAYS BEFORE YOUR APPOINTMENT  You will receive a telephone call from one of our [facility] team members - your caller ID may say "Unknown caller." If this is a video visit, we will walk you through how to get the video launched on your phone. We will remind you check your blood pressure, heart rate and weight prior to your scheduled appointment. If you have an Apple Watch or Kardia, please upload any pertinent ECG strips the day before or morning of your appointment to MyChart. Our staff will also make sure you have reviewed the consent and agree to move forward with your scheduled tele-health visit.     THE DAY OF YOUR APPOINTMENT  Approximately 15 minutes prior to your scheduled appointment, you will receive a telephone call from one of [facility] team - your caller ID may say "Unknown caller."    Our staff will confirm medications, vital signs for the day and any symptoms you may be experiencing. Please have this information available prior to the time of visit start. It may also be helpful for you to have a pad of paper and pen handy for any instructions given during your visit. They will also walk you through joining the smartphone meeting if this is a video visit.    CONSENT FOR TELE-HEALTH VISIT - PLEASE REVIEW  I hereby voluntarily request, consent and authorize [facility] and its employed or contracted physicians, physician  assistants, nurse practitioners or other licensed health care professionals (the Practitioner), to provide me with telemedicine health care services (the "Services") as deemed necessary by the treating Practitioner. I acknowledge and consent to receive the Services by the Practitioner via telemedicine. I understand that the telemedicine visit will involve communicating with the Practitioner through live audiovisual communication technology and the disclosure of certain medical information by electronic transmission. I acknowledge that I have been given the opportunity to request an in-person assessment or other available alternative prior to the telemedicine visit and am voluntarily participating in the telemedicine visit.  I understand that I have the right to withhold or withdraw my consent to the use of telemedicine in the course of my care at any time, without affecting my right to future care or treatment, and that the Practitioner or I may terminate the telemedicine visit at any time. I understand that I have the right to inspect all information obtained and/or recorded in the course of the telemedicine visit and may receive copies of available information for a reasonable fee.  I understand that some of the potential risks of receiving the Services via telemedicine include:  . Delay or interruption in medical evaluation due to technological equipment failure or disruption; . Information transmitted may not be sufficient (e.g. poor resolution of images) to allow for appropriate medical decision making by the Practitioner; and/or  . In rare instances, security protocols could fail, causing a breach of personal health information.  Furthermore, I acknowledge that it is my responsibility to provide information about my medical history, conditions and care that is complete and accurate to the best of my ability. I acknowledge that Practitioner's advice, recommendations, and/or decision may be based on  factors not within their control, such as incomplete or inaccurate data provided by me or distortions of diagnostic images or specimens that may result from electronic transmissions. I understand that the practice of medicine is not an exact science and that Practitioner makes no warranties or guarantees regarding treatment outcomes. I acknowledge that I will receive a copy of this consent concurrently upon execution via email to the email address I last provided but may also request a printed copy by calling the office of [facility].    I understand that my insurance will be billed for this visit.   I have read or had this consent read to me. . I understand the contents of this consent, which adequately explains the benefits and risks of the Services being provided via telemedicine.  . I have been provided ample opportunity to ask questions regarding this consent and the Services and have had my questions answered to my satisfaction. . I give my informed consent for the services to be provided through the use of telemedicine in my medical care  By participating in this telemedicine visit I agree to the above.  

## 2018-08-02 ENCOUNTER — Encounter: Payer: Self-pay | Admitting: Cardiology

## 2018-08-02 ENCOUNTER — Other Ambulatory Visit: Payer: Self-pay

## 2018-08-02 ENCOUNTER — Telehealth (INDEPENDENT_AMBULATORY_CARE_PROVIDER_SITE_OTHER): Payer: Self-pay | Admitting: Cardiology

## 2018-08-02 VITALS — BP 117/83 | HR 74 | Ht 68.0 in | Wt 207.0 lb

## 2018-08-02 DIAGNOSIS — Z8249 Family history of ischemic heart disease and other diseases of the circulatory system: Secondary | ICD-10-CM

## 2018-08-02 DIAGNOSIS — R000 Tachycardia, unspecified: Secondary | ICD-10-CM

## 2018-08-02 DIAGNOSIS — R0789 Other chest pain: Secondary | ICD-10-CM

## 2018-08-02 NOTE — Progress Notes (Signed)
Virtual Visit via Video Note   This visit type was conducted due to national recommendations for restrictions regarding the COVID-19 Pandemic (e.g. social distancing) in an effort to limit this patient's exposure and mitigate transmission in our community.  Due to his co-morbid illnesses, this patient is at least at moderate risk for complications without adequate follow up.  This format is felt to be most appropriate for this patient at this time.  All issues noted in this document were discussed and addressed.  A limited physical exam was performed with this format.  Please refer to the patient's chart for his consent to telehealth for [facility].   Date:  08/02/2018   ID:  Casey Miles, DOB 07/13/1965, MRN [id number]  Patient Location: Home Provider Location: Home  PCP:  Mosie Lukes, MD  Cardiologist:  Candee Furbish, MD  Electrophysiologist:  None   Evaluation Performed:  Consultation - Casey Miles was referred by Dr. Charlett Blake for the evaluation of tachycardia.  Chief Complaint: Occasional tachycardia during treadmill  History of Present Illness:    Casey Miles is a 53 y.o. male with GERD hypertension hyperlipidemia previously seen in 2015 by Dr. Angelena Form here.  Evaluate tachycardia.  Had a negative stress test and CT scan for PE in 2011.  Was having ongoing chest pain and therefore had a cardiac catheterization in 2012 that showed no CAD.  Has had chest pain over the years.  Echo in 2015 showed normal systolic function.  Has cervical spine issues.  Previously about potentially his pain was coming from his neck region. Numbness in fingertips.   Anxious at times. In April started feeling chest pain and discomfort in back again. Last Sunday of April, felt light headed when stood up. Had to sit down. Watch was on. BP was taken. In May BP was high. Increased activity, treadmill, walking. HR increase to 120 with mild effort. Next week. Jumped up to 148 for 20 min until stopped. 150  stayed there. 3.7 miles per hour with mild incline. Not harder to breathe. Noted this again. HR bumped up again. 101 at 20 min to 23 148 after. 141 to 123 to 81 in 5 min.  He was worried as well that if he had had COVID-19 and he was hypoxic if his heart rate would have increased.  I do believe that if he was hypoxic he would have had continuous episodes of shortness of breath not just during the treadmill activity.  The patient does not have symptoms concerning for COVID-19 infection (fever, chills, cough, or new shortness of breath).    Past Medical History:  Diagnosis Date  . Abdominal pain, epigastric 09/26/2009  . Arthralgia of multiple sites 08/13/2010  . CHEST PAIN, ATYPICAL 05/04/2008  . Chest wall pain   . Chronic rhinitis 09/26/2009  . COSTOCHONDRITIS, RECURRENT 05/06/2010  . 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 Elevated BP 10/08/2011  . Elevated LFTs 06/19/2016  . Fatigue 01/08/2011  . GERD (gastroesophageal reflux disease)   . Hyperglycemia 06/17/2016  . Hyperlipidemia 10/08/2010  . Kidney stone   . NECK PAIN 09/26/2009  . Other acute reactions to stress 09/26/2009  . Other and unspecified hyperlipidemia 01/23/2013  . PARESTHESIA 11/22/2009  . Preventative health care 08/01/2012  . SHOULDER PAIN, LEFT 07/17/2008  . Testosterone deficiency 07/09/2011  . Vitamin D deficiency 07/08/2011   No past surgical history on file.   No outpatient medications have been marked as taking for the 08/02/18 encounter (Telemedicine)  with Jerline Pain, MD.     Allergies:   Patient has no known allergies.   Social History   Tobacco Use  . Smoking status: Never Smoker  . Smokeless tobacco: Never Used  Substance Use Topics  . Alcohol use: Yes    Alcohol/week: 1.0 - 2.0 standard drinks    Types: 1 - 2 Standard drinks or equivalent per week    Comment: 1-2 per week  . Drug use: No     Family Hx: The patient's family history includes Cancer in his unknown relative; Colitis in his  maternal grandfather and maternal grandmother; Colon polyps in his unknown relative; Diabetes in his maternal grandmother, mother, paternal grandmother, and another family member; Heart attack in an other family member; Heart attack (age of onset: 5) in his father; Heart disease in his maternal grandfather and maternal grandmother; Hyperlipidemia in his mother; Hypertension in his mother and another family member; Irritable bowel syndrome in his unknown relative and another family member; Lupus in his cousin; Stomach cancer in his unknown relative.  ROS:   Please see the history of present illness.    No fevers chills nausea syncope bleeding All other systems reviewed and are negative.   Prior CV studies:   The following studies were reviewed today:  Echo 10/19/13: Left ventricle: The cavity size was normal. Systolic function was normal. The estimated ejection fraction was in the range of 55% to 60%. Wall motion was normal; there were no regional wall motion abnormalities. Left ventricular diastolic function parameters were normal.  EKG: NSR, rate 76 bpm  Exercise treadmill test in 2015- low risk.   Labs/Other Tests and Data Reviewed:    EKG:  An ECG dated 12/22/13 was personally reviewed today and demonstrated:  NSR 66  Recent Labs: 08/03/2017: ALT 31; BUN 20; Creatinine, Ser 1.40; Hemoglobin 15.1; Platelets 255; Potassium 4.0; Sodium 142   Recent Lipid Panel Lab Results  Component Value Date/Time   CHOL 203 (H) 06/17/2016 03:39 PM   TRIG 87.0 06/17/2016 03:39 PM   HDL 53.70 06/17/2016 03:39 PM   CHOLHDL 4 06/17/2016 03:39 PM   LDLCALC 132 (H) 06/17/2016 03:39 PM   LDLDIRECT 78.0 04/09/2015 02:43 PM    Wt Readings from Last 3 Encounters:  08/02/18 207 lb (93.9 kg)  08/04/17 225 lb (102.1 kg)  08/03/17 225 lb (102.1 kg)     Objective:    Vital Signs:  BP 117/83   Pulse 74   Ht 5\' 8"  (1.727 m)   Wt 207 lb (93.9 kg)   BMI 31.47 kg/m    VITAL SIGNS:  reviewed GEN:   no acute distress EYES:  sclerae anicteric, EOMI - Extraocular Movements Intact RESPIRATORY:  normal respiratory effort, symmetric expansion SKIN:  no rash, lesions or ulcers. MUSCULOSKELETAL:  no obvious deformities. NEURO:  alert and oriented x 3, no obvious focal deficit PSYCH:  normal affect  ASSESSMENT & PLAN:    Chest pain - Agree that it is probably radicular, neuropathic type discomfort.  Continue to monitor.  Previous cardiac catheterization reassuring.  Occasional tachycardia - This is within standard limits especially during exercise.  Heart rate jumping to 140 is not unusual.  Continue to monitor.  He has a Garmin watch.  He was also concerned about an occasional heart rate in the mid 40s noted when sitting down on the couch.  He was asymptomatic at the time.  Also told him that is not unusual during rest to see transient heart rates in  this range especially during sleep.  His heart rate was in the 60s during his dizzy episode.  Early family history of CAD -Father massive heart attack age 63.  Suggested that in the future we may desire to check either a coronary calcium score or if he is having ongoing chest discomfort, could consider coronary CT.  Since he has had prior heart catheterization and treadmill test, we will not pursue any further testing at this time.  Of course if symptoms become more worrisome, he will let me know.  As a reassurance, he had a CT scan in 2019 for renal stones and this did not show any evidence of aortic atherosclerosis.  LDL has ranged between 101 32.  Diet.  Exercise.  Orthostatic hypotension - This is likely the cause of his dizziness when he was bending over and getting back up after being in a hot summer day situation with his kids outside.  Continue to hydrate well, salt liberalization.  Blood pressures normally running 110/70.  Essential hypertension -Excellent, currently on no medications.  COVID-19 Education: The signs and symptoms of  COVID-19 were discussed with the patient and how to seek care for testing (follow up with PCP or arrange E-visit).  The importance of social distancing was discussed today.  Time:   Today, I have spent 25 minutes with the patient with telehealth technology discussing the above problems.     Medication Adjustments/Labs and Tests Ordered: Current medicines are reviewed at length with the patient today.  Concerns regarding medicines are outlined above.   Tests Ordered: No orders of the defined types were placed in this encounter.   Medication Changes: No orders of the defined types were placed in this encounter.   Disposition:  Follow up in 6 month(s)  Signed, Candee Furbish, MD  08/02/2018 12:17 PM    [facility]

## 2018-08-02 NOTE — Patient Instructions (Signed)
Medication Instructions:  The current medical regimen is effective;  continue present plan and medications.  If you need a refill on your cardiac medications before your next appointment, please call your pharmacy.   Follow-Up: Follow up in 6 months with Dr. Skains.  You will receive a letter in the mail 2 months before you are due.  Please call us when you receive this letter to schedule your follow up appointment.   Thank you for choosing [facility]!!       

## 2018-12-12 ENCOUNTER — Encounter: Payer: Self-pay | Admitting: Family Medicine

## 2018-12-13 ENCOUNTER — Other Ambulatory Visit: Payer: Self-pay | Admitting: Family Medicine

## 2018-12-13 MED ORDER — FLUCONAZOLE 150 MG PO TABS: ORAL_TABLET | ORAL | 1 refills | Status: DC

## 2018-12-13 NOTE — Progress Notes (Unsigned)
diflucan 

## 2018-12-20 ENCOUNTER — Other Ambulatory Visit: Payer: Self-pay

## 2018-12-20 ENCOUNTER — Ambulatory Visit (INDEPENDENT_AMBULATORY_CARE_PROVIDER_SITE_OTHER): Payer: 59 | Admitting: Family Medicine

## 2018-12-20 DIAGNOSIS — E349 Endocrine disorder, unspecified: Secondary | ICD-10-CM | POA: Diagnosis not present

## 2018-12-20 DIAGNOSIS — R739 Hyperglycemia, unspecified: Secondary | ICD-10-CM

## 2018-12-20 DIAGNOSIS — R21 Rash and other nonspecific skin eruption: Secondary | ICD-10-CM

## 2018-12-20 DIAGNOSIS — E559 Vitamin D deficiency, unspecified: Secondary | ICD-10-CM | POA: Diagnosis not present

## 2018-12-20 DIAGNOSIS — E782 Mixed hyperlipidemia: Secondary | ICD-10-CM | POA: Diagnosis not present

## 2018-12-20 DIAGNOSIS — R000 Tachycardia, unspecified: Secondary | ICD-10-CM | POA: Diagnosis not present

## 2018-12-20 MED ORDER — NYSTATIN 100000 UNIT/GM EX CREA: 1.0000 "application " | TOPICAL_CREAM | Freq: Two times a day (BID) | CUTANEOUS | 1 refills | Status: DC

## 2018-12-23 ENCOUNTER — Encounter: Payer: Self-pay | Admitting: Family Medicine

## 2018-12-23 ENCOUNTER — Telehealth: Payer: Self-pay | Admitting: Family Medicine

## 2018-12-23 NOTE — Telephone Encounter (Signed)
LM to schedule lab appt and flu shot (same day please)

## 2018-12-26 DIAGNOSIS — R000 Tachycardia, unspecified: Secondary | ICD-10-CM | POA: Insufficient documentation

## 2018-12-26 DIAGNOSIS — R21 Rash and other nonspecific skin eruption: Secondary | ICD-10-CM | POA: Insufficient documentation

## 2018-12-26 NOTE — Assessment & Plan Note (Signed)
Supplement and monitor 

## 2018-12-26 NOTE — Assessment & Plan Note (Signed)
Monitor and report concerns. Minimize caffeine and stay well hydrated

## 2018-12-26 NOTE — Progress Notes (Signed)
Virtual Visit via Video Note  I connected with Casey Miles on 12/20/18 at  3:40 PM EDT by a video enabled telemedicine application and verified that I am speaking with the correct person using two identifiers.  Location: Patient: home Provider: office   I discussed the limitations of evaluation and management by telemedicine and the availability of in person appointments. The patient expressed understanding and agreed to proceed. Casey Miles was able to get the patient set up on a video visit   Subjective:    Patient ID: [name], male    DOB: 1966/03/02, 53 y.o.   MRN: [id number]  No chief complaint on file.   HPI Patient is in today for evaluation of pruritus, reash and follow up on chronic medical concerns including hyperglycemia, hyperlipdidemia and more. No recent febrile illness or hospitalizations. But has had trouble with rashes. It started in axillae but then generalized. Was very pruritic but it is improving some with addition of histamine blockers and diflucan. No change in products, clothing etc. No polyuria or polydipsia. Denies CP/palp/SOB/HA/congestion/fevers/GI or GU c/o. Taking meds as prescribed  Past Medical History:  Diagnosis Date  . Abdominal pain, epigastric 09/26/2009  . Arthralgia of multiple sites 08/13/2010  . CHEST PAIN, ATYPICAL 05/04/2008  . Chest wall pain   . Chronic rhinitis 09/26/2009  . COSTOCHONDRITIS, RECURRENT 05/06/2010  . 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 Elevated BP 10/08/2011  . Elevated LFTs 06/19/2016  . Fatigue 01/08/2011  . GERD (gastroesophageal reflux disease)   . Hyperglycemia 06/17/2016  . Hyperlipidemia 10/08/2010  . Kidney stone   . NECK PAIN 09/26/2009  . Other acute reactions to stress 09/26/2009  . Other and unspecified hyperlipidemia 01/23/2013  . PARESTHESIA 11/22/2009  . Preventative health care 08/01/2012  . SHOULDER PAIN, LEFT 07/17/2008  . Testosterone deficiency 07/09/2011  . Vitamin D deficiency  07/08/2011    No past surgical history on file.  Family History  Problem Relation Age of Onset  . Diabetes Mother   . Hypertension Mother   . Hyperlipidemia Mother   . Diabetes Maternal Grandmother   . Heart disease Maternal Grandmother   . Colitis Maternal Grandmother   . Diabetes Paternal Grandmother   . Colitis Maternal Grandfather   . Heart disease Maternal Grandfather   . Heart attack Father 50  . Lupus Cousin   . Irritable bowel syndrome Unknown   . Cancer Unknown        Esophageal- Great Grandfather  . Stomach cancer Unknown        Uncle  . Colon polyps Unknown        Grandmother  . Diabetes Other        Family history of  . Hypertension Other        Family history of  . Heart attack Other        Family history of  . Irritable bowel syndrome Other        Family history of    Social History   Socioeconomic History  . Marital status: Married    Spouse name: Not on file  . Number of children: Not on file  . Years of education: Not on file  . Highest education level: Not on file  Occupational History  . Not on file  Social Needs  . Financial resource strain: Not on file  . Food insecurity    Worry: Not on file    Inability: Not on file  . Transportation needs  Medical: Not on file    Non-medical: Not on file  Tobacco Use  . Smoking status: Never Smoker  . Smokeless tobacco: Never Used  Substance and Sexual Activity  . Alcohol use: Yes    Alcohol/week: 1.0 - 2.0 standard drinks    Types: 1 - 2 Standard drinks or equivalent per week    Comment: 1-2 per week  . Drug use: No  . Sexual activity: Yes    Comment: lives wife and children, exercises intermittently, heart healthy diet.  Lifestyle  . Physical activity    Days per week: Not on file    Minutes per session: Not on file  . Stress: Not on file  Relationships  . Social Herbalist on phone: Not on file    Gets together: Not on file    Attends religious service: Not on file     Active member of club or organization: Not on file    Attends meetings of clubs or organizations: Not on file    Relationship status: Not on file  . Intimate partner violence    Fear of current or ex partner: Not on file    Emotionally abused: Not on file    Physically abused: Not on file    Forced sexual activity: Not on file  Other Topics Concern  . Not on file  Social History Narrative  . Not on file    Outpatient Medications Prior to Visit  Medication Sig Dispense Refill  . fluconazole (DIFLUCAN) 150 MG tablet Take 1 tab po daily x 3 days then weekly x 3 weekly 6 tablet 1   No facility-administered medications prior to visit.     No Known Allergies  Review of Systems  Constitutional: Negative for fever and malaise/fatigue.  HENT: Negative for congestion.   Eyes: Negative for blurred vision.  Respiratory: Negative for shortness of breath.   Cardiovascular: Negative for chest pain, palpitations and leg swelling.  Gastrointestinal: Negative for abdominal pain, blood in stool and nausea.  Genitourinary: Negative for dysuria and frequency.  Musculoskeletal: Negative for falls.  Skin: Positive for itching and rash.  Neurological: Negative for dizziness, loss of consciousness and headaches.  Endo/Heme/Allergies: Negative for environmental allergies.  Psychiatric/Behavioral: Negative for depression. The patient is not nervous/anxious.        Objective:    Physical Exam Constitutional:      Appearance: Normal appearance. He is not ill-appearing.  HENT:     Head: Normocephalic and atraumatic.  Eyes:     General:        Right eye: No discharge.        Left eye: No discharge.  Pulmonary:     Effort: Pulmonary effort is normal.  Neurological:     Mental Status: He is alert and oriented to person, place, and time.  Psychiatric:        Mood and Affect: Mood normal.        Behavior: Behavior normal.     There were no vitals taken for this visit. Wt Readings from Last  3 Encounters:  08/02/18 207 lb (93.9 kg)  08/04/17 225 lb (102.1 kg)  08/03/17 225 lb (102.1 kg)    Diabetic Foot Exam - Simple   No data filed     Lab Results  Component Value Date   WBC 8.1 08/03/2017   HGB 15.1 08/03/2017   HCT 42.9 08/03/2017   PLT 255 08/03/2017   GLUCOSE 157 (H) 08/03/2017   CHOL 203 (H)  06/17/2016   TRIG 87.0 06/17/2016   HDL 53.70 06/17/2016   LDLDIRECT 78.0 04/09/2015   LDLCALC 132 (H) 06/17/2016   ALT 31 08/03/2017   AST 34 08/03/2017   NA 142 08/03/2017   K 4.0 08/03/2017   CL 108 08/03/2017   CREATININE 1.40 (H) 08/03/2017   BUN 20 08/03/2017   CO2 24 08/03/2017   TSH 0.64 06/17/2016   INR 0.96 03/12/2010   HGBA1C 5.4 04/09/2015    Lab Results  Component Value Date   TSH 0.64 06/17/2016   Lab Results  Component Value Date   WBC 8.1 08/03/2017   HGB 15.1 08/03/2017   HCT 42.9 08/03/2017   MCV 91.1 08/03/2017   PLT 255 08/03/2017   Lab Results  Component Value Date   NA 142 08/03/2017   K 4.0 08/03/2017   CO2 24 08/03/2017   GLUCOSE 157 (H) 08/03/2017   BUN 20 08/03/2017   CREATININE 1.40 (H) 08/03/2017   BILITOT 0.5 08/03/2017   ALKPHOS 58 08/03/2017   AST 34 08/03/2017   ALT 31 08/03/2017   PROT 7.7 08/03/2017   ALBUMIN 4.3 08/03/2017   CALCIUM 9.0 08/03/2017   ANIONGAP 10 08/03/2017   GFR 70.46 06/17/2016   Lab Results  Component Value Date   CHOL 203 (H) 06/17/2016   Lab Results  Component Value Date   HDL 53.70 06/17/2016   Lab Results  Component Value Date   LDLCALC 132 (H) 06/17/2016   Lab Results  Component Value Date   TRIG 87.0 06/17/2016   Lab Results  Component Value Date   CHOLHDL 4 06/17/2016   Lab Results  Component Value Date   HGBA1C 5.4 04/09/2015       Assessment & Plan:   Problem List Items Addressed This Visit    Vitamin D deficiency    Supplement and monitor      Relevant Orders   VITAMIN D 25 Hydroxy (Vit-D Deficiency, Fractures)   Testosterone deficiency   Relevant  Orders   PSA   Testosterone   Mixed hyperlipidemia    Encouraged heart healthy diet, increase exercise, avoid trans fats, consider a krill oil cap daily      Relevant Orders   Lipid panel   Hyperglycemia    hgba1c acceptable, minimize simple carbs. Increase exercise as tolerated.       Relevant Orders   Hemoglobin A1c   Tachycardia - Primary    Monitor and report concerns. Minimize caffeine and stay well hydrated      Relevant Orders   CBC   Comprehensive metabolic panel   TSH   Rash    Likely a reaction to a fungal infection. Continue Diflucan, Famotidine, Cetirizine and added Nystatin cream prn, report if worsens         I am having Casey Miles start on nystatin cream. I am also having him maintain his fluconazole.  Meds ordered this encounter  Medications  . nystatin cream (MYCOSTATIN)    Sig: Apply 1 application topically 2 (two) times daily.    Dispense:  90 g    Refill:  1     Penni Homans, MD   I discussed the assessment and treatment plan with the patient. The patient was provided an opportunity to ask questions and all were answered. The patient agreed with the plan and demonstrated an understanding of the instructions.   The patient was advised to call back or seek an in-person evaluation if the symptoms worsen or if the  condition fails to improve as anticipated.  I provided 25 minutes of non-face-to-face time during this encounter.   Penni Homans, MD

## 2018-12-26 NOTE — Assessment & Plan Note (Signed)
Likely a reaction to a fungal infection. Continue Diflucan, Famotidine, Cetirizine and added Nystatin cream prn, report if worsens

## 2018-12-26 NOTE — Assessment & Plan Note (Signed)
hgba1c acceptable, minimize simple carbs. Increase exercise as tolerated.  

## 2018-12-26 NOTE — Assessment & Plan Note (Signed)
Encouraged heart healthy diet, increase exercise, avoid trans fats, consider a krill oil cap daily 

## 2019-01-06 ENCOUNTER — Other Ambulatory Visit: Payer: 59

## 2019-01-06 ENCOUNTER — Other Ambulatory Visit (INDEPENDENT_AMBULATORY_CARE_PROVIDER_SITE_OTHER): Payer: 59

## 2019-01-06 ENCOUNTER — Ambulatory Visit (INDEPENDENT_AMBULATORY_CARE_PROVIDER_SITE_OTHER): Payer: 59 | Admitting: *Deleted

## 2019-01-06 ENCOUNTER — Other Ambulatory Visit: Payer: Self-pay

## 2019-01-06 DIAGNOSIS — E782 Mixed hyperlipidemia: Secondary | ICD-10-CM | POA: Diagnosis not present

## 2019-01-06 DIAGNOSIS — R739 Hyperglycemia, unspecified: Secondary | ICD-10-CM | POA: Diagnosis not present

## 2019-01-06 DIAGNOSIS — Z23 Encounter for immunization: Secondary | ICD-10-CM | POA: Diagnosis not present

## 2019-01-06 DIAGNOSIS — E559 Vitamin D deficiency, unspecified: Secondary | ICD-10-CM

## 2019-01-06 DIAGNOSIS — R000 Tachycardia, unspecified: Secondary | ICD-10-CM

## 2019-01-06 DIAGNOSIS — E349 Endocrine disorder, unspecified: Secondary | ICD-10-CM | POA: Diagnosis not present

## 2019-01-06 LAB — COMPREHENSIVE METABOLIC PANEL
ALT: 23 U/L (ref 0–53)
AST: 18 U/L (ref 0–37)
Albumin: 4.4 g/dL (ref 3.5–5.2)
Alkaline Phosphatase: 53 U/L (ref 39–117)
BUN: 15 mg/dL (ref 6–23)
CO2: 30 mEq/L (ref 19–32)
Calcium: 9.3 mg/dL (ref 8.4–10.5)
Chloride: 106 mEq/L (ref 96–112)
Creatinine, Ser: 1.14 mg/dL (ref 0.40–1.50)
GFR: 67.6 mL/min (ref >60.00)
Glucose, Bld: 97 mg/dL (ref 70–99)
Potassium: 4.4 mEq/L (ref 3.5–5.1)
Sodium: 143 mEq/L (ref 135–145)
Total Bilirubin: 0.5 mg/dL (ref 0.2–1.2)
Total Protein: 6.9 g/dL (ref 6.0–8.3)

## 2019-01-06 LAB — LDL CHOLESTEROL, DIRECT: Direct LDL: 88 mg/dL

## 2019-01-06 LAB — LIPID PANEL
Cholesterol: 194 mg/dL (ref 0–200)
HDL: 39.8 mg/dL (ref >39.00)
NonHDL: 154.02
Total CHOL/HDL Ratio: 5
Triglycerides: 251 mg/dL — ABNORMAL HIGH (ref 0.0–149.0)
VLDL: 50.2 mg/dL — ABNORMAL HIGH (ref 0.0–40.0)

## 2019-01-06 LAB — CBC
HCT: 42.8 % (ref 39.0–52.0)
Hemoglobin: 15 g/dL (ref 13.0–17.0)
MCHC: 35 g/dL (ref 30.0–36.0)
MCV: 93 fl (ref 78.0–100.0)
Platelets: 230 10*3/uL (ref 150.0–400.0)
RBC: 4.6 Mil/uL (ref 4.22–5.81)
RDW: 13 % (ref 11.5–15.5)
WBC: 5.6 10*3/uL (ref 4.0–10.5)

## 2019-01-06 LAB — VITAMIN D 25 HYDROXY (VIT D DEFICIENCY, FRACTURES): VITD: 19.37 ng/mL — ABNORMAL LOW (ref 30.00–100.00)

## 2019-01-06 LAB — PSA: PSA: 0.19 ng/mL (ref 0.10–4.00)

## 2019-01-06 LAB — TESTOSTERONE: Testosterone: 425.67 ng/dL (ref 300.00–890.00)

## 2019-01-06 LAB — HEMOGLOBIN A1C: Hgb A1c MFr Bld: 5.5 % (ref 4.6–6.5)

## 2019-01-06 LAB — TSH: TSH: 0.88 u[IU]/mL (ref 0.35–4.50)

## 2019-01-06 NOTE — Progress Notes (Signed)
Patient here today for flu vaccine.  Vaccine given in left deltoid and patient tolerated well. 

## 2019-01-07 MED ORDER — VITAMIN D (ERGOCALCIFEROL) 1.25 MG (50000 UNIT) PO CAPS: 50000.0000 [IU] | ORAL_CAPSULE | ORAL | 4 refills | Status: DC

## 2019-01-07 NOTE — Addendum Note (Signed)
Addended by: Magdalene Molly A on: 01/07/2019 03:14 PM   Modules accepted: Orders

## 2019-04-04 ENCOUNTER — Other Ambulatory Visit: Payer: Self-pay

## 2019-04-05 ENCOUNTER — Ambulatory Visit (INDEPENDENT_AMBULATORY_CARE_PROVIDER_SITE_OTHER): Payer: 59 | Admitting: Family Medicine

## 2019-04-05 ENCOUNTER — Encounter: Payer: Self-pay | Admitting: Family Medicine

## 2019-04-05 VITALS — BP 120/84 | HR 78 | Temp 97.6°F | Resp 18 | Ht 68.0 in | Wt 216.0 lb

## 2019-04-05 DIAGNOSIS — R1031 Right lower quadrant pain: Secondary | ICD-10-CM | POA: Diagnosis not present

## 2019-04-05 DIAGNOSIS — R1011 Right upper quadrant pain: Secondary | ICD-10-CM | POA: Diagnosis not present

## 2019-04-05 DIAGNOSIS — Z1211 Encounter for screening for malignant neoplasm of colon: Secondary | ICD-10-CM

## 2019-04-05 DIAGNOSIS — R1033 Periumbilical pain: Secondary | ICD-10-CM

## 2019-04-05 DIAGNOSIS — Z0000 Encounter for general adult medical examination without abnormal findings: Secondary | ICD-10-CM

## 2019-04-05 DIAGNOSIS — Z0001 Encounter for general adult medical examination with abnormal findings: Secondary | ICD-10-CM

## 2019-04-05 DIAGNOSIS — E559 Vitamin D deficiency, unspecified: Secondary | ICD-10-CM

## 2019-04-05 DIAGNOSIS — E349 Endocrine disorder, unspecified: Secondary | ICD-10-CM

## 2019-04-05 DIAGNOSIS — R739 Hyperglycemia, unspecified: Secondary | ICD-10-CM | POA: Diagnosis not present

## 2019-04-05 DIAGNOSIS — B354 Tinea corporis: Secondary | ICD-10-CM

## 2019-04-05 DIAGNOSIS — E782 Mixed hyperlipidemia: Secondary | ICD-10-CM

## 2019-04-05 DIAGNOSIS — R21 Rash and other nonspecific skin eruption: Secondary | ICD-10-CM

## 2019-04-05 MED ORDER — TERBINAFINE HCL 1 % EX CREA: 1.0000 "application " | TOPICAL_CREAM | Freq: Two times a day (BID) | CUTANEOUS | 0 refills | Status: DC

## 2019-04-05 NOTE — Patient Instructions (Addendum)
[email or web address]  Shingrix is the new shingles shot, 2 shots over 2-6 months call insurance and confirm coverage, document check where it is paid for best and then make appt.     Preventive Care 52-52 Years Old, Male Preventive care refers to lifestyle choices and visits with your health care provider that can promote health and wellness. This includes:  A yearly physical exam. This is also called an annual well check.  Regular dental and eye exams.  Immunizations.  Screening for certain conditions.  Healthy lifestyle choices, such as eating a healthy diet, getting regular exercise, not using drugs or products that contain nicotine and tobacco, and limiting alcohol use. What can I expect for my preventive care visit? Physical exam Your health care provider will check:  Height and weight. These may be used to calculate body mass index (BMI), which is a measurement that tells if you are at a healthy weight.  Heart rate and blood pressure.  Your skin for abnormal spots. Counseling Your health care provider may ask you questions about:  Alcohol, tobacco, and drug use.  Emotional well-being.  Home and relationship well-being.  Sexual activity.  Eating habits.  Work and work Statistician. What immunizations do I need?  Influenza (flu) vaccine  This is recommended every year. Tetanus, diphtheria, and pertussis (Tdap) vaccine  You may need a Td booster every 10 years. Varicella (chickenpox) vaccine  You may need this vaccine if you have not already been vaccinated. Zoster (shingles) vaccine  You may need this after age 19. Measles, mumps, and rubella (MMR) vaccine  You may need at least one dose of MMR if you were born in 1957 or later. You may also need a second dose. Pneumococcal conjugate (PCV13) vaccine  You may need this if you have certain conditions and were not previously vaccinated. Pneumococcal polysaccharide (PPSV23)  vaccine  You may need one or two doses if you smoke cigarettes or if you have certain conditions. Meningococcal conjugate (MenACWY) vaccine  You may need this if you have certain conditions. Hepatitis A vaccine  You may need this if you have certain conditions or if you travel or work in places where you may be exposed to hepatitis A. Hepatitis B vaccine  You may need this if you have certain conditions or if you travel or work in places where you may be exposed to hepatitis B. Haemophilus influenzae type b (Hib) vaccine  You may need this if you have certain risk factors. Human papillomavirus (HPV) vaccine  If recommended by your health care provider, you may need three doses over 6 months. You may receive vaccines as individual doses or as more than one vaccine together in one shot (combination vaccines). Talk with your health care provider about the risks and benefits of combination vaccines. What tests do I need? Blood tests  Lipid and cholesterol levels. These may be checked every 5 years, or more frequently if you are over 73 years old.  Hepatitis C test.  Hepatitis B test. Screening  Lung cancer screening. You may have this screening every year starting at age 52 if you have a 30-pack-year history of smoking and currently smoke or have quit within the past 15 years.  Prostate cancer screening. Recommendations will vary depending on your family history and other risks.  Colorectal cancer screening. All adults should have this screening starting at age 52 and continuing until age 52. Your health care provider may recommend screening at age 52 if you are at increased  risk. You will have tests every 1-10 years, depending on your results and the type of screening test.  Diabetes screening. This is done by checking your blood sugar (glucose) after you have not eaten for a while (fasting). You may have this done every 1-3 years.  Sexually transmitted disease (STD)  testing. Follow these instructions at home: Eating and drinking  Eat a diet that includes fresh fruits and vegetables, whole grains, lean protein, and low-fat dairy products.  Take vitamin and mineral supplements as recommended by your health care provider.  Do not drink alcohol if your health care provider tells you not to drink.  If you drink alcohol: ? Limit how much you have to 0-2 drinks a day. ? Be aware of how much alcohol is in your drink. In the [place] one drink equals one 12 oz bottle of beer (355 mL), one 5 oz glass of wine (148 mL), or one 1 oz glass of hard liquor (44 mL). Lifestyle  Take daily care of your teeth and gums.  Stay active. Exercise for at least 30 minutes on 5 or more days each week.  Do not use any products that contain nicotine or tobacco, such as cigarettes, e-cigarettes, and chewing tobacco. If you need help quitting, ask your health care provider.  If you are sexually active, practice safe sex. Use a condom or other form of protection to prevent STIs (sexually transmitted infections).  Talk with your health care provider about taking a low-dose aspirin every day starting at age 23. What's next?  Go to your health care provider once a year for a well check visit.  Ask your health care provider how often you should have your eyes and teeth checked.  Stay up to date on all vaccines. This information is not intended to replace advice given to you by your health care provider. Make sure you discuss any questions you have with your health care provider. Document Revised: 02/11/2018 Document Reviewed: 02/11/2018 Elsevier Patient Education  2020 [employer].

## 2019-04-05 NOTE — Assessment & Plan Note (Addendum)
Patient encouraged to maintain heart healthy diet, regular exercise, adequate sleep. Consider daily probiotics. Take medications as prescribed. Labs ordered and reviewed. Referred to gastroenterology for screening colonoscopy

## 2019-04-05 NOTE — Assessment & Plan Note (Signed)
Encouraged heart healthy diet, increase exercise, avoid trans fats, consider a krill oil cap daily 

## 2019-04-05 NOTE — Assessment & Plan Note (Signed)
Supplement and monitor 

## 2019-04-05 NOTE — Assessment & Plan Note (Signed)
hgba1c acceptable, minimize simple carbs. Increase exercise as tolerated. Continue current meds 

## 2019-04-05 NOTE — Assessment & Plan Note (Signed)
Left upper inner thigh, small spots, itchy at times. Has not spread and has been.

## 2019-04-06 ENCOUNTER — Telehealth: Payer: Self-pay | Admitting: Family Medicine

## 2019-04-06 DIAGNOSIS — B354 Tinea corporis: Secondary | ICD-10-CM | POA: Insufficient documentation

## 2019-04-06 NOTE — Progress Notes (Signed)
Subjective:    Patient ID: Casey Miles, male    DOB: 02-15-1966, 54 y.o.   MRN: [id number]  No chief complaint on file.   HPI Patient is in today for annual preventative exam and follow up on chronic medical concerns including hyperlipidemia, hyperglycemia and more. He has a couple of new concerns including a rash persistent for the past 6 months. It is itchy at times and responded partially to Diflucan in the past. He also notes some intermittent abdominal pain, sometimes periumbilical but more often on the right lower quadrant, it comes and goes and he describes it as stabbing. No significant change in bowel habits. No bloody or tarry stool. No recent febrile illness or hospitalizations. Is trying to stay active and maintain a heart healthy diet. Denies CP/palp/SOB/HA/congestion/fevers/GI or GU c/o. Taking meds as prescribed  Past Medical History:  Diagnosis Date  . Abdominal pain, epigastric 09/26/2009  . Arthralgia of multiple sites 08/13/2010  . CHEST PAIN, ATYPICAL 05/04/2008  . Chest wall pain   . Chronic rhinitis 09/26/2009  . COSTOCHONDRITIS, RECURRENT 05/06/2010  . 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 Elevated BP 10/08/2011  . Elevated LFTs 06/19/2016  . Fatigue 01/08/2011  . GERD (gastroesophageal reflux disease)   . Hyperglycemia 06/17/2016  . Hyperlipidemia 10/08/2010  . Kidney stone   . NECK PAIN 09/26/2009  . Other acute reactions to stress 09/26/2009  . Other and unspecified hyperlipidemia 01/23/2013  . PARESTHESIA 11/22/2009  . Preventative health care 08/01/2012  . SHOULDER PAIN, LEFT 07/17/2008  . Testosterone deficiency 07/09/2011  . Vitamin D deficiency 07/08/2011    History reviewed. No pertinent surgical history.  Family History  Problem Relation Age of Onset  . Diabetes Mother   . Hypertension Mother   . Hyperlipidemia Mother   . Diabetes Maternal Grandmother   . Heart disease Maternal Grandmother   . Colitis Maternal Grandmother   . Diabetes  Paternal Grandmother   . Colitis Maternal Grandfather   . Heart disease Maternal Grandfather   . Heart attack Father 74  . Lupus Cousin   . Irritable bowel syndrome Unknown   . Cancer Unknown        Esophageal- Great Grandfather  . Stomach cancer Unknown        Uncle  . Colon polyps Unknown        Grandmother  . Diabetes Other        Family history of  . Hypertension Other        Family history of  . Heart attack Other        Family history of  . Irritable bowel syndrome Other        Family history of    Social History   Socioeconomic History  . Marital status: Married    Spouse name: Not on file  . Number of children: Not on file  . Years of education: Not on file  . Highest education level: Not on file  Occupational History  . Not on file  Tobacco Use  . Smoking status: Never Smoker  . Smokeless tobacco: Never Used  Substance and Sexual Activity  . Alcohol use: Yes    Alcohol/week: 1.0 - 2.0 standard drinks    Types: 1 - 2 Standard drinks or equivalent per week    Comment: 1-2 per week  . Drug use: No  . Sexual activity: Yes    Comment: lives wife and children, exercises intermittently, heart healthy diet.  Other Topics Concern  .  Not on file  Social History Narrative  . Not on file   Social Determinants of Health   Financial Resource Strain:   . Difficulty of Paying Living Expenses: Not on file  Food Insecurity:   . Worried About Charity fundraiser in the Last Year: Not on file  . Ran Out of Food in the Last Year: Not on file  Transportation Needs:   . Lack of Transportation (Medical): Not on file  . Lack of Transportation (Non-Medical): Not on file  Physical Activity:   . Days of Exercise per Week: Not on file  . Minutes of Exercise per Session: Not on file  Stress:   . Feeling of Stress : Not on file  Social Connections:   . Frequency of Communication with Friends and Family: Not on file  . Frequency of Social Gatherings with Friends and Family:  Not on file  . Attends Religious Services: Not on file  . Active Member of Clubs or Organizations: Not on file  . Attends Archivist Meetings: Not on file  . Marital Status: Not on file  Intimate Partner Violence:   . Fear of Current or Ex-Partner: Not on file  . Emotionally Abused: Not on file  . Physically Abused: Not on file  . Sexually Abused: Not on file    Outpatient Medications Prior to Visit  Medication Sig Dispense Refill  . nystatin cream (MYCOSTATIN) Apply 1 application topically 2 (two) times daily. 90 g 1  . Vitamin D, Ergocalciferol, (DRISDOL) 1.25 MG (50000 UT) CAPS capsule Take 1 capsule (50,000 Units total) by mouth every 7 (seven) days. 4 capsule 4  . fluconazole (DIFLUCAN) 150 MG tablet Take 1 tab po daily x 3 days then weekly x 3 weekly 6 tablet 1   No facility-administered medications prior to visit.    No Known Allergies  Review of Systems  Constitutional: Negative for chills, fever and malaise/fatigue.  HENT: Negative for congestion and hearing loss.   Eyes: Negative for discharge.  Respiratory: Negative for cough, sputum production and shortness of breath.   Cardiovascular: Negative for chest pain, palpitations and leg swelling.  Gastrointestinal: Positive for abdominal pain. Negative for blood in stool, constipation, diarrhea, heartburn, nausea and vomiting.  Genitourinary: Negative for dysuria, frequency, hematuria and urgency.  Musculoskeletal: Negative for back pain, falls and myalgias.  Skin: Negative for rash.  Neurological: Positive for dizziness. Negative for sensory change, loss of consciousness, weakness and headaches.  Endo/Heme/Allergies: Negative for environmental allergies. Does not bruise/bleed easily.  Psychiatric/Behavioral: Negative for depression and suicidal ideas. The patient is not nervous/anxious and does not have insomnia.        Objective:    Physical Exam Vitals and nursing note reviewed.  Constitutional:       General: He is not in acute distress.    Appearance: He is well-developed.  HENT:     Head: Normocephalic and atraumatic.     Nose: Nose normal.  Eyes:     General:        Right eye: No discharge.        Left eye: No discharge.  Cardiovascular:     Rate and Rhythm: Normal rate and regular rhythm.     Heart sounds: No murmur.  Pulmonary:     Effort: Pulmonary effort is normal.     Breath sounds: Normal breath sounds.  Abdominal:     General: Bowel sounds are normal. There is no distension.     Palpations: Abdomen is  soft. There is no mass.     Tenderness: There is abdominal tenderness. There is no guarding or rebound.  Musculoskeletal:     Cervical back: Normal range of motion and neck supple.  Skin:    General: Skin is warm and dry.  Neurological:     Mental Status: He is alert and oriented to person, place, and time.     BP 120/84 (BP Location: Left Arm, Patient Position: Sitting, Cuff Size: Normal)   Pulse 78   Temp 97.6 F (36.4 C) (Temporal)   Resp 18   Ht 5\' 8"  (1.727 m)   Wt 216 lb (98 kg)   SpO2 99%   BMI 32.84 kg/m  Wt Readings from Last 3 Encounters:  04/05/19 216 lb (98 kg)  08/02/18 207 lb (93.9 kg)  08/04/17 225 lb (102.1 kg)    Diabetic Foot Exam - Simple   No data filed     Lab Results  Component Value Date   WBC 5.6 01/06/2019   HGB 15.0 01/06/2019   HCT 42.8 01/06/2019   PLT 230.0 01/06/2019   GLUCOSE 97 01/06/2019   CHOL 194 01/06/2019   TRIG 251.0 (H) 01/06/2019   HDL 39.80 01/06/2019   LDLDIRECT 88.0 01/06/2019   LDLCALC 132 (H) 06/17/2016   ALT 23 01/06/2019   AST 18 01/06/2019   NA 143 01/06/2019   K 4.4 01/06/2019   CL 106 01/06/2019   CREATININE 1.14 01/06/2019   BUN 15 01/06/2019   CO2 30 01/06/2019   TSH 0.88 01/06/2019   PSA 0.19 01/06/2019   INR 0.96 03/12/2010   HGBA1C 5.5 01/06/2019    Lab Results  Component Value Date   TSH 0.88 01/06/2019   Lab Results  Component Value Date   WBC 5.6 01/06/2019   HGB 15.0  01/06/2019   HCT 42.8 01/06/2019   MCV 93.0 01/06/2019   PLT 230.0 01/06/2019   Lab Results  Component Value Date   NA 143 01/06/2019   K 4.4 01/06/2019   CO2 30 01/06/2019   GLUCOSE 97 01/06/2019   BUN 15 01/06/2019   CREATININE 1.14 01/06/2019   BILITOT 0.5 01/06/2019   ALKPHOS 53 01/06/2019   AST 18 01/06/2019   ALT 23 01/06/2019   PROT 6.9 01/06/2019   ALBUMIN 4.4 01/06/2019   CALCIUM 9.3 01/06/2019   ANIONGAP 10 08/03/2017   GFR 67.60 01/06/2019   Lab Results  Component Value Date   CHOL 194 01/06/2019   Lab Results  Component Value Date   HDL 39.80 01/06/2019   Lab Results  Component Value Date   LDLCALC 132 (H) 06/17/2016   Lab Results  Component Value Date   TRIG 251.0 (H) 01/06/2019   Lab Results  Component Value Date   CHOLHDL 5 01/06/2019   Lab Results  Component Value Date   HGBA1C 5.5 01/06/2019       Assessment & Plan:   Problem List Items Addressed This Visit    RUQ pain    maintain low fat, low spice diet. Has been having trouble for several weeks already will proceed with ultrasound of abdomen for further evaluation.       Vitamin D deficiency    Supplement and monitor       Relevant Orders   VITAMIN D 25 Hydroxy (Vit-D Deficiency, Fractures)   VITAMIN D 25 Hydroxy (Vit-D Deficiency, Fractures)   Testosterone deficiency    WNL on recent check      Relevant Orders   PSA  Testosterone   Preventative health care - Primary    Patient encouraged to maintain heart healthy diet, regular exercise, adequate sleep. Consider daily probiotics. Take medications as prescribed. Labs ordered and reviewed. Referred to gastroenterology for screening colonoscopy      Relevant Orders   Hemoglobin A1c   CBC   Comprehensive metabolic panel   Lipid panel   TSH   VITAMIN D 25 Hydroxy (Vit-D Deficiency, Fractures)   Hemoglobin A1c   CBC   Comprehensive metabolic panel   Lipid panel   TSH   PSA   Testosterone   Mixed hyperlipidemia     Encouraged heart healthy diet, increase exercise, avoid trans fats, consider a krill oil cap daily      Relevant Orders   Lipid panel   TSH   Lipid panel   TSH   Hyperglycemia    hgba1c acceptable, minimize simple carbs. Increase exercise as tolerated. Continue current meds      Relevant Orders   Hemoglobin A1c   CBC   Comprehensive metabolic panel   TSH   Hemoglobin A1c   CBC   Comprehensive metabolic panel   TSH   Rash    Left upper inner thigh, small spots, itchy at times. Has not spread and has been.       Relevant Medications   terbinafine (LAMISIL AT) 1 % cream   Tinea corporis    Left upper inner thigh, given rx for topical Lamisil to apply bid.       Relevant Medications   terbinafine (LAMISIL AT) 1 % cream    Other Visit Diagnoses    Colon cancer screening       Relevant Orders   Ambulatory referral to Gastroenterology   RLQ abdominal pain       Relevant Orders   US Abdomen Complete   Periumbilical abdominal pain       Relevant Orders   US Abdomen Complete      I have discontinued Beatriz E. Balsam's fluconazole. I am also having him start on terbinafine. Additionally, I am having him maintain his nystatin cream and Vitamin D (Ergocalciferol).  Meds ordered this encounter  Medications  . terbinafine (LAMISIL AT) 1 % cream    Sig: Apply 1 application topically 2 (two) times daily.    Dispense:  30 g    Refill:  0     Penni Homans, MD

## 2019-04-06 NOTE — Assessment & Plan Note (Signed)
Left upper inner thigh, given rx for topical Lamisil to apply bid.

## 2019-04-06 NOTE — Telephone Encounter (Signed)
Caller Name: [name], wife Phone: [phone]  Casey Miles saw Dr. Charlett Blake 2/1 and they discussed his risk factors for COVID-19. Today his wife was sent home from work due to an exposure on Monday when serving food at a leadership training function. She was masked and not directly seated with the person who tested positive. Should [employer] w/in the home from his wife? Wear a mask in the home? Does he need to be tested?

## 2019-04-06 NOTE — Assessment & Plan Note (Signed)
maintain low fat, low spice diet. Has been having trouble for several weeks already will proceed with ultrasound of abdomen for further evaluation.

## 2019-04-06 NOTE — Assessment & Plan Note (Signed)
WNL on recent check

## 2019-04-07 NOTE — Telephone Encounter (Signed)
Pt wife called back. Spoke with Princess and advised that pt does not need to quarantine from family. Recommended testing if he has symptoms (indirect exposure). Pt has not developed symptoms and aware if he does to contact office for virtual appt.

## 2019-04-11 ENCOUNTER — Encounter: Payer: Self-pay | Admitting: Family Medicine

## 2019-04-11 ENCOUNTER — Other Ambulatory Visit: Payer: Self-pay

## 2019-04-11 ENCOUNTER — Encounter: Payer: Self-pay | Admitting: Gastroenterology

## 2019-04-11 ENCOUNTER — Ambulatory Visit (HOSPITAL_BASED_OUTPATIENT_CLINIC_OR_DEPARTMENT_OTHER)
Admission: RE | Admit: 2019-04-11 | Discharge: 2019-04-11 | Disposition: A | Discharge status: Home / Self Care | Payer: 59 | Source: Ambulatory Visit | Attending: Family Medicine | Admitting: Family Medicine

## 2019-04-11 DIAGNOSIS — R1030 Lower abdominal pain, unspecified: Secondary | ICD-10-CM

## 2019-04-11 DIAGNOSIS — R1031 Right lower quadrant pain: Secondary | ICD-10-CM | POA: Diagnosis present

## 2019-04-11 DIAGNOSIS — R1033 Periumbilical pain: Secondary | ICD-10-CM | POA: Insufficient documentation

## 2019-04-11 DIAGNOSIS — K828 Other specified diseases of gallbladder: Secondary | ICD-10-CM

## 2019-04-26 ENCOUNTER — Ambulatory Visit (AMBULATORY_SURGERY_CENTER): Payer: Self-pay | Admitting: *Deleted

## 2019-04-26 ENCOUNTER — Other Ambulatory Visit: Payer: Self-pay

## 2019-04-26 VITALS — Ht 68.0 in | Wt 215.0 lb

## 2019-04-26 DIAGNOSIS — Z1211 Encounter for screening for malignant neoplasm of colon: Secondary | ICD-10-CM

## 2019-04-26 DIAGNOSIS — Z01818 Encounter for other preprocedural examination: Secondary | ICD-10-CM

## 2019-04-26 MED ORDER — SUPREP BOWEL PREP KIT 17.5-3.13-1.6 GM/177ML PO SOLN: 1.0000 | Freq: Once | ORAL | 0 refills | Status: AC

## 2019-04-26 NOTE — Progress Notes (Signed)
Pt verified name, DOB, address and insurance during PV today. Pt mailed instruction packet to included paper to complete and mail back to [facility] with addressed and stamped envelope, Emmi video, copy of consent form to read and not return, and instructions. Suprep coupon mailed in packet. PV completed over the phone. Pt encouraged to call with questions or issues   No egg or soy allergy known to patient  No issues with past sedation with any surgeries  or procedures, no intubation problems  No diet pills per patient No home 02 use per patient  No blood thinners per patient  Pt denies issues with constipation  No A fib or A flutter  EMMI video sent to pt's e mail   Due to the COVID-19 pandemic we are asking patients to follow these guidelines. Please only bring one care partner. Please be aware that your care partner may wait in the car in the parking lot or if they feel like they will be too hot to wait in the car, they may wait in the lobby on the 4th floor. All care partners are required to wear a mask the entire time (we do not have any that we can provide them), they need to practice social distancing, and we will do a Covid check for all patient's and care partners when you arrive. Also we will check their temperature and your temperature. If the care partner waits in their car they need to stay in the parking lot the entire time and we will call them on their cell phone when the patient is ready for discharge so they can bring the car to the front of the building. Also all patient's will need to wear a mask into building.

## 2019-05-10 ENCOUNTER — Encounter: Payer: 59 | Admitting: Gastroenterology

## 2019-05-19 ENCOUNTER — Encounter: Payer: Self-pay | Admitting: Gastroenterology

## 2019-05-19 ENCOUNTER — Other Ambulatory Visit: Payer: Self-pay | Admitting: Gastroenterology

## 2019-05-19 ENCOUNTER — Ambulatory Visit (INDEPENDENT_AMBULATORY_CARE_PROVIDER_SITE_OTHER): Payer: 59

## 2019-05-19 DIAGNOSIS — Z1159 Encounter for screening for other viral diseases: Secondary | ICD-10-CM

## 2019-05-19 LAB — SARS CORONAVIRUS 2 (TAT 6-24 HRS): SARS Coronavirus 2: NEGATIVE

## 2019-05-24 ENCOUNTER — Other Ambulatory Visit: Payer: Self-pay

## 2019-05-24 ENCOUNTER — Ambulatory Visit (AMBULATORY_SURGERY_CENTER): Payer: 59 | Admitting: Gastroenterology

## 2019-05-24 ENCOUNTER — Encounter: Payer: Self-pay | Admitting: Gastroenterology

## 2019-05-24 VITALS — BP 126/79 | HR 72 | Temp 96.6°F | Resp 14 | Ht 68.0 in | Wt 215.0 lb

## 2019-05-24 DIAGNOSIS — Z1211 Encounter for screening for malignant neoplasm of colon: Secondary | ICD-10-CM | POA: Diagnosis present

## 2019-05-24 DIAGNOSIS — D125 Benign neoplasm of sigmoid colon: Secondary | ICD-10-CM

## 2019-05-24 MED ORDER — SODIUM CHLORIDE 0.9 % IV SOLN: 500.0000 mL | Freq: Once | INTRAVENOUS | Status: DC

## 2019-05-24 NOTE — Patient Instructions (Signed)
YOU HAD AN ENDOSCOPIC PROCEDURE TODAY AT [facility]:   Refer to the procedure report that was given to you for any specific questions about what was found during the examination.  If the procedure report does not answer your questions, please call your gastroenterologist to clarify.  If you requested that your care partner not be given the details of your procedure findings, then the procedure report has been included in a sealed envelope for you to review at your convenience later.  YOU SHOULD EXPECT: Some feelings of bloating in the abdomen. Passage of more gas than usual.  Walking can help get rid of the air that was put into your GI tract during the procedure and reduce the bloating. If you had a lower endoscopy (such as a colonoscopy or flexible sigmoidoscopy) you may notice spotting of blood in your stool or on the toilet paper. If you underwent a bowel prep for your procedure, you may not have a normal bowel movement for a few days.  Please Note:  You might notice some irritation and congestion in your nose or some drainage.  This is from the oxygen used during your procedure.  There is no need for concern and it should clear up in a day or so.  SYMPTOMS TO REPORT IMMEDIATELY:   Following lower endoscopy (colonoscopy or flexible sigmoidoscopy):  Excessive amounts of blood in the stool  Significant tenderness or worsening of abdominal pains  Swelling of the abdomen that is new, acute  Fever of 100F or higher  For urgent or emergent issues, a gastroenterologist can be reached at any hour by calling [phone]. Do not use MyChart messaging for urgent concerns.   DIET:  We do recommend a small meal at first, but then you may proceed to your regular diet.  Drink plenty of fluids but you should avoid alcoholic beverages for 24 hours.  ACTIVITY:  You should plan to take it easy for the rest of today and you should NOT DRIVE or use heavy machinery until tomorrow (because of  the sedation medicines used during the test).    FOLLOW UP: Our staff will call the number listed on your records 48-72 hours following your procedure to check on you and address any questions or concerns that you may have regarding the information given to you following your procedure. If we do not reach you, we will leave a message.  We will attempt to reach you two times.  During this call, we will ask if you have developed any symptoms of COVID 19. If you develop any symptoms (ie: fever, flu-like symptoms, shortness of breath, cough etc.) before then, please call [phone].  If you test positive for Covid 19 in the 2 weeks post procedure, please call and report this information to [place].    If any biopsies were taken you will be contacted by phone or by letter within the next 1-3 weeks.  Please call us at [phone] if you have not heard about the biopsies in 3 weeks.    SIGNATURES/CONFIDENTIALITY: You and/or your care partner have signed paperwork which will be entered into your electronic medical record.  These signatures attest to the fact that that the information above on your After Visit Summary has been reviewed and is understood.  Full responsibility of the confidentiality of this discharge information lies with you and/or your care-partner.  No Aspirin, Aspirin containing products, Ibuprofen (Advil, Aleve, Motrin) for 5 days- Tylenol is ok to take  Please  read over handout about polyps and diverticulosis

## 2019-05-24 NOTE — Op Note (Signed)
[facility] Patient Name: Casey Miles Procedure Date: 05/24/2019 9:26 AM MRN: [id number] Endoscopist: Casey Miles , MD Age: 54 Referring MD:  Date of Birth: 01/20/1966 Gender: Male Account #: [id number] Procedure:                Colonoscopy Indications:              Screening for colorectal malignant neoplasm Medicines:                Monitored Anesthesia Care Procedure:                Pre-Anesthesia Assessment:                           - Prior to the procedure, a History and Physical                            was performed, and patient medications and                            allergies were reviewed. The patient's tolerance of                            previous anesthesia was also reviewed. The risks                            and benefits of the procedure and the sedation                            options and risks were discussed with the patient.                            All questions were answered, and informed consent                            was obtained. Prior Anticoagulants: The patient has                            taken no previous anticoagulant or antiplatelet                            agents. ASA Grade Assessment: II - A patient with                            mild systemic disease. After reviewing the risks                            and benefits, the patient was deemed in                            satisfactory condition to undergo the procedure.                           After obtaining informed consent, the colonoscope  was passed under direct vision. Throughout the                            procedure, the patient's blood pressure, pulse, and                            oxygen saturations were monitored continuously. The                            Colonoscope was introduced through the anus and                            advanced to the 2 cm into the ileum. The                            colonoscopy was performed without  difficulty. The                            patient tolerated the procedure well. The quality                            of the bowel preparation was good. The terminal                            ileum, ileocecal valve, appendiceal orifice, and                            rectum were photographed. Scope In: 9:29:09 AM Scope Out: 9:41:54 AM Scope Withdrawal Time: 0 hours 10 minutes 26 seconds  Total Procedure Duration: 0 hours 12 minutes 45 seconds  Findings:                 A 12 mm polyp was found in the distal sigmoid                            colon, 25 cm from the anal verge. The polyp was                            semi-pedunculated with a thick pedicle. The polyp                            was removed with a hot snare. Resection and                            retrieval were complete. Estimated blood loss: none.                           A few rare small-mouthed diverticula were found in                            the sigmoid colon.                           The terminal ileum appeared normal.  The exam was otherwise without abnormality on                            direct and retroflexion views.                           Non-bleeding internal hemorrhoids were found during                            retroflexion. The hemorrhoids were small. Complications:            No immediate complications. Estimated Blood Loss:     Estimated blood loss: none. Impression:               -Colonic polyp s/p polypectomy.                           -Minimal sigmoid diverticulosis.                           -Otherwise normal colonoscopy to TI. Recommendation:           - Patient has a contact number available for                            emergencies. The signs and symptoms of potential                            delayed complications were discussed with the                            patient. Return to normal activities tomorrow.                            Written discharge  instructions were provided to the                            patient.                           - High fiber diet.                           - Continue present medications.                           - Await pathology results.                           - No aspirin, ibuprofen, naproxen, or other                            non-steroidal anti-inflammatory drugs for 5 days                            after polyp removal.                           -  Repeat colonoscopy for surveillance based on                            pathology results.                           - The findings and recommendations were discussed                            with Casey Miles. Casey Denmark, MD 05/24/2019 9:52:13 AM This report has been signed electronically.

## 2019-05-24 NOTE — Progress Notes (Signed)
Called to room to assist during endoscopic procedure.  Patient ID and intended procedure confirmed with present staff. Received instructions for my participation in the procedure from the performing physician.  

## 2019-05-24 NOTE — Progress Notes (Signed)
Pt's states no medical or surgical changes since previsit or office visit.  Temp taken by LC VS taken by DT 

## 2019-05-24 NOTE — Progress Notes (Signed)
Report to PACU, RN, vss, BBS= Clear.  

## 2019-05-26 ENCOUNTER — Encounter: Payer: Self-pay | Admitting: Gastroenterology

## 2019-05-26 ENCOUNTER — Telehealth: Payer: Self-pay

## 2019-05-26 NOTE — Telephone Encounter (Signed)
  Follow up Call-  Call back number 05/24/2019  Post procedure Call Back phone  # [phone]  Permission to leave phone message Yes  Some recent data might be hidden     Patient questions:  Do you have a fever, pain , or abdominal swelling? No. Pain Score  0 *  Have you tolerated food without any problems? Yes.    Have you been able to return to your normal activities? Yes.    Do you have any questions about your discharge instructions: Diet   No. Medications  No. Follow up visit  No.  Do you have questions or concerns about your Care? No.  Actions: * If pain score is 4 or above: No action needed, pain <4.   1. Have you developed a fever since your procedure? No  2.   Have you had an respiratory symptoms (SOB or cough) since your procedure? No  3.   Have you tested positive for COVID 19 since your procedure No  4.   Have you had any family members/close contacts diagnosed with the COVID 19 since your procedure?  No   If yes to any of these questions please route to Joylene John, RN and Erenest Rasher, RN

## 2019-05-28 ENCOUNTER — Other Ambulatory Visit: Payer: Self-pay | Admitting: Family Medicine

## 2019-06-07 ENCOUNTER — Other Ambulatory Visit: Payer: Self-pay | Admitting: Surgery

## 2019-06-07 DIAGNOSIS — R932 Abnormal findings on diagnostic imaging of liver and biliary tract: Secondary | ICD-10-CM

## 2019-06-07 DIAGNOSIS — R109 Unspecified abdominal pain: Secondary | ICD-10-CM

## 2019-06-23 ENCOUNTER — Ambulatory Visit
Admission: RE | Admit: 2019-06-23 | Discharge: 2019-06-23 | Disposition: A | Discharge status: Home / Self Care | Payer: 59 | Source: Ambulatory Visit | Attending: Surgery | Admitting: Surgery

## 2019-06-23 DIAGNOSIS — R932 Abnormal findings on diagnostic imaging of liver and biliary tract: Secondary | ICD-10-CM

## 2019-06-23 DIAGNOSIS — R109 Unspecified abdominal pain: Secondary | ICD-10-CM

## 2019-06-30 ENCOUNTER — Other Ambulatory Visit: Payer: Self-pay

## 2019-06-30 ENCOUNTER — Encounter: Payer: Self-pay | Admitting: Cardiology

## 2019-06-30 ENCOUNTER — Ambulatory Visit: Payer: 59 | Admitting: Cardiology

## 2019-06-30 VITALS — BP 120/90 | HR 76 | Ht 68.0 in | Wt 220.0 lb

## 2019-06-30 DIAGNOSIS — R000 Tachycardia, unspecified: Secondary | ICD-10-CM

## 2019-06-30 DIAGNOSIS — Z8249 Family history of ischemic heart disease and other diseases of the circulatory system: Secondary | ICD-10-CM

## 2019-06-30 DIAGNOSIS — R079 Chest pain, unspecified: Secondary | ICD-10-CM | POA: Diagnosis not present

## 2019-06-30 DIAGNOSIS — R0789 Other chest pain: Secondary | ICD-10-CM

## 2019-06-30 MED ORDER — METOPROLOL TARTRATE 100 MG PO TABS: ORAL_TABLET | ORAL | 0 refills | Status: DC

## 2019-06-30 NOTE — Progress Notes (Signed)
Cardiology Office Note:    Date:  06/30/2019   ID:  Casey Miles, DOB [birth year], MRN [id number]  PCP:  Mosie Lukes, MD  Cardiologist:  Candee Furbish, MD  Electrophysiologist:  None   Referring MD: Mosie Lukes, MD     History of Present Illness:    Casey Miles is a 54 y.o. male here for the follow-up of tachycardia, hypertension, hyperlipidemia.  Cardiac catheterization 2012 showed no CAD. Echo 2015-normal pump function  Heart rate increased to 120 with mild effort. Dropped to 148 at one point for 20 minutes then stopped. Seem to be within normal limits . Father had a massive heart attack in his early 44s.  Prior EKG showed sinus rhythm 66  Has gained weight. 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   Has CP off and on. Left localized and across back. Spinal issues, could be nerves. Tingling, left arm, axilla.   Past Medical History:  Diagnosis Date  . Abdominal pain, epigastric 09/26/2009  . Allergy    possible per pt - no dx   . Arthralgia of multiple sites 08/13/2010  . CHEST PAIN, ATYPICAL 05/04/2008  . Chest wall pain   . Chronic rhinitis 09/26/2009  . COSTOCHONDRITIS, RECURRENT 05/06/2010  . 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 Elevated BP 10/08/2011  . Elevated LFTs 06/19/2016  . Fatigue 01/08/2011  . GERD (gastroesophageal reflux disease)   . Hyperglycemia 06/17/2016  . Hyperlipidemia 10/08/2010  . Kidney stone   . NECK PAIN 09/26/2009  . Neuromuscular disorder ([place])    C4C5 herniated disc   . Other acute reactions to stress 09/26/2009  . Other and unspecified hyperlipidemia 01/23/2013  . PARESTHESIA 11/22/2009  . Preventative health care 08/01/2012  . SHOULDER PAIN, LEFT 07/17/2008  . Testosterone deficiency 07/09/2011  . Vitamin D deficiency 07/08/2011    Past Surgical History:  Procedure Laterality Date  . CARDIAC CATHETERIZATION     2013-2014  . UMBILICAL HERNIA REPAIR  2017  . UPPER GASTROINTESTINAL ENDOSCOPY       Current Medications: Current Meds  Medication Sig  . aspirin (ASPIRIN LOW DOSE) 81 MG EC tablet Take 81 mg by mouth daily.   . cholecalciferol (VITAMIN D3) 25 MCG (1000 UNIT) tablet Take 1,000 Units by mouth daily. Every day but Thursday  . Multiple Vitamins-Minerals (CENTRUM ADULTS) TABS   . nystatin cream (MYCOSTATIN) Apply 1 application topically 2 (two) times daily.  Marland Kitchen terbinafine (LAMISIL AT) 1 % cream Apply 1 application topically 2 (two) times daily.  . Vitamin D, Ergocalciferol, (DRISDOL) 1.25 MG (50000 UNIT) CAPS capsule TAKE 1 CAPSULE (50,000 UNITS TOTAL) BY MOUTH EVERY 7 (SEVEN) DAYS.     Allergies:   Patient has no known allergies.   Social History   Socioeconomic History  . Marital status: Married    Spouse name: Not on file  . Number of children: Not on file  . Years of education: Not on file  . Highest education level: Not on file  Occupational History  . Not on file  Tobacco Use  . Smoking status: Never Smoker  . Smokeless tobacco: Never Used  Substance and Sexual Activity  . Alcohol use: Yes    Alcohol/week: 1.0 - 2.0 standard drinks    Types: 1 - 2 Standard drinks or equivalent per week    Comment: 1-2 per week  . Drug use: No  . Sexual activity: Yes    Comment: lives wife and children, exercises  intermittently, heart healthy diet.  Other Topics Concern  . Not on file  Social History Narrative  . Not on file   Social Determinants of Health   Financial Resource Strain:   . Difficulty of Paying Living Expenses:   Food Insecurity:   . Worried About Charity fundraiser in the Last Year:   . Arboriculturist in the Last Year:   Transportation Needs:   . Film/video editor (Medical):   Marland Kitchen Lack of Transportation (Non-Medical):   Physical Activity:   . Days of Exercise per Week:   . Minutes of Exercise per Session:   Stress:   . Feeling of Stress :   Social Connections:   . Frequency of Communication with Friends and Family:   . Frequency of  Social Gatherings with Friends and Family:   . Attends Religious Services:   . Active Member of Clubs or Organizations:   . Attends Archivist Meetings:   Marland Kitchen Marital Status:      Family History: The patient's family history includes Cancer in an other family member; Colitis in his maternal grandfather and maternal grandmother; Colon polyps in an other family member; Diabetes in his maternal grandmother, mother, paternal grandmother, and another family member; Heart attack in an other family member; Heart attack (age of onset: 47) in his father; Heart disease in his maternal grandfather and maternal grandmother; Hyperlipidemia in his mother; Hypertension in his mother and another family member; Irritable bowel syndrome in some other family members; Lupus in his cousin; Stomach cancer in an other family member. There is no history of Colon cancer, Esophageal cancer, or Rectal cancer.  ROS:   Please see the history of present illness.    No fevers chills nausea vomiting syncope bleeding all other systems reviewed and are negative.  EKGs/Labs/Other Studies Reviewed:    The following studies were reviewed today: As above  EKG:  EKG is  ordered today.  The ekg ordered today demonstrates sinus rhythm 76 no other significant abnormalities  Recent Labs: 01/06/2019: ALT 23; BUN 15; Creatinine, Ser 1.14; Hemoglobin 15.0; Platelets 230.0; Potassium 4.4; Sodium 143; TSH 0.88  Recent Lipid Panel    Component Value Date/Time   CHOL 194 01/06/2019 0941   TRIG 251.0 (H) 01/06/2019 0941   HDL 39.80 01/06/2019 0941   CHOLHDL 5 01/06/2019 0941   VLDL 50.2 (H) 01/06/2019 0941   LDLCALC 132 (H) 06/17/2016 1539   LDLDIRECT 88.0 01/06/2019 0941    Physical Exam:    VS:  BP 120/90   Pulse 76   Ht 5\' 8"  (1.727 m)   Wt 220 lb (99.8 kg)   BMI 33.45 kg/m     Wt Readings from Last 3 Encounters:  06/30/19 220 lb (99.8 kg)  05/24/19 215 lb (97.5 kg)  04/26/19 215 lb (97.5 kg)     GEN:  Well  nourished, well developed in no acute distress HEENT: Normal NECK: No JVD; No carotid bruits LYMPHATICS: No lymphadenopathy CARDIAC: RRR, no murmurs, rubs, gallops RESPIRATORY:  Clear to auscultation without rales, wheezing or rhonchi  ABDOMEN: Soft, non-tender, non-distended MUSCULOSKELETAL:  No edema; No deformity  SKIN: Warm and dry NEUROLOGIC:  Alert and oriented x 3 PSYCHIATRIC:  Normal affect   ASSESSMENT:    1. Tachycardia   2. Family history of early CAD   3. Atypical chest pain   4. Chest pain, unspecified type    PLAN:    In order of problems listed above:  Paroxysmal  tachycardia -During last visit we discussed that his increase in exercise is usually within the standard limits of exercise. He has a Jeannie Done watch to monitor this. Occasionally he will notice his heart rate in the mid 40s when he sitting down on the couch. He is asymptomatic. Not unusual. His heart rate one point was 60s during the previous dizzy episode.  Early family history of CAD -Father massive heart attack at age 37. -Suggested coronary calcium score or if he was having ongoing atypical chest discomfort could consider coronary CT. -Cardiac catheterization in 2012 was normal. -CT scan 2019 in the abdomen for renal stones did not show any evidence of aortic atherosclerosis. Personally reviewed. -Continue with good primary prevention strategies. LDL about 100  Continued chest discomfort -Still stating that he is having some chest discomfort sometimes upper left arm discomfort, axillary discomfort.  Sometimes his discomfort can wrap around to his back.  With and without exertion.  I think since his been approximately 10 years since a coronary evaluation has taken place and he has such a strong family history with his father having a massive heart attack in his early 49s.  I think a coronary CT scan with possible FFR analysis would be perfect for him and help guide further therapies including prevention  therapies if he does have any evidence of plaque-i.e. adding statin for instance.   Medication Adjustments/Labs and Tests Ordered: Current medicines are reviewed at length with the patient today.  Concerns regarding medicines are outlined above.  Orders Placed This Encounter  Procedures  . CT CORONARY FRACTIONAL FLOW RESERVE DATA PREP  . CT CORONARY FRACTIONAL FLOW RESERVE FLUID ANALYSIS  . CT CORONARY MORPH W/CTA COR W/SCORE W/CA W/CM &/OR WO/CM  . Basic Metabolic Panel (BMET)  . EKG 12-Lead   Meds ordered this encounter  Medications  . metoprolol tartrate (LOPRESSOR) 100 MG tablet    Sig: Take one tablet (100 mg) by mouth two hours prior to your cardiac CT. Do not take if your heart rate is less than 55 BPM.    Dispense:  1 tablet    Refill:  0    Patient Instructions  Medication Instructions:  Your physician recommends that you continue on your current medications as directed. Please refer to the Current Medication list given to you today.  *If you need a refill on your cardiac medications before your next appointment, please call your pharmacy*   Lab Work:  If you have labs (blood work) drawn today and your tests are completely normal, you will receive your results only by: Marland Kitchen MyChart Message (if you have MyChart) OR . A paper copy in the mail If you have any lab test that is abnormal or we need to change your treatment, we will call you to review the results.   Testing/Procedures: BMET prior to CT  Your cardiac CT will be scheduled at one of the below locations:   [facility] [address] [place], [place] [address] [phone]  If scheduled at [facility], please arrive at the [facility] main entrance of [facility] 30 minutes prior to test start time. Proceed to the [facility] Radiology Department (first floor) to check-in and test prep.  Please follow these instructions carefully (unless otherwise directed):  Hold all erectile  dysfunction medications at least 3 days (72 hrs) prior to test.  On the Night Before the Test: . Be sure to Drink plenty of water. . Do not consume any caffeinated/decaffeinated beverages  or chocolate 12 hours prior to your test. .  On the Day of the Test: . Drink plenty of water. Do not drink any water within one hour of the test. . Do not eat any food 4 hours prior to the test. . You may take your regular medications prior to the test.  . Take metoprolol (Lopressor) two hours prior to test.                 -If HR is less than 55 BPM- No Lopressor                -IF HR is greater than 55 BPM and patient is less than or equal to 51 yrs old       Lopressor 100mg  x1.                      After the Test: . Drink plenty of water. . After receiving IV contrast, you may experience a mild flushed feeling. This is normal. . On occasion, you may experience a mild rash up to 24 hours after the test. This is not dangerous. If this occurs, you can take Benadryl 25 mg and increase your fluid intake. . If you experience trouble breathing, this can be serious. If it is severe call 911 IMMEDIATELY. If it is mild, please call our office. . If you take any of these medications: Glipizide/Metformin, Avandament, Glucavance, please do not take 48 hours after completing test unless otherwise instructed.   Once we have confirmed authorization from your insurance company, we will call you to set up a date and time for your test.   For non-scheduling related questions, please contact the cardiac imaging nurse navigator should you have any questions/concerns: Marchia Bond, RN Navigator Cardiac Imaging [facility] [phone] office  For scheduling needs, including cancellations and rescheduling, please call [phone].      Follow-Up: At [facility], you and your health needs are our priority.  As part of our continuing mission to provide you with exceptional heart care, we  have created designated Provider Care Teams.  These Care Teams include your primary Cardiologist (physician) and Advanced Practice Providers (APPs -  Physician Assistants and Nurse Practitioners) who all work together to provide you with the care you need, when you need it.  We recommend signing up for the patient portal called "MyChart".  Sign up information is provided on this After Visit Summary.  MyChart is used to connect with patients for Virtual Visits (Telemedicine).  Patients are able to view lab/test results, encounter notes, upcoming appointments, etc.  Non-urgent messages can be sent to your provider as well.   To learn more about what you can do with MyChart, go to [email or web address].    Your next appointment:   1 year(s)  The format for your next appointment:   In Person  Provider:   Candee Furbish, MD   Other Instructions      Signed, Candee Furbish, MD  06/30/2019 8:54 AM    [place]

## 2019-06-30 NOTE — Patient Instructions (Signed)
Medication Instructions:  Your physician recommends that you continue on your current medications as directed. Please refer to the Current Medication list given to you today.  *If you need a refill on your cardiac medications before your next appointment, please call your pharmacy*   Lab Work:  If you have labs (blood work) drawn today and your tests are completely normal, you will receive your results only by: Marland Kitchen MyChart Message (if you have MyChart) OR . A paper copy in the mail If you have any lab test that is abnormal or we need to change your treatment, we will call you to review the results.   Testing/Procedures: BMET prior to CT  Your cardiac CT will be scheduled at one of the below locations:   [facility] [address] [place], [place] [address] [phone]  If scheduled at [facility], please arrive at the [facility] main entrance of [facility] 30 minutes prior to test start time. Proceed to the [facility] Radiology Department (first floor) to check-in and test prep.  Please follow these instructions carefully (unless otherwise directed):  Hold all erectile dysfunction medications at least 3 days (72 hrs) prior to test.  On the Night Before the Test: . Be sure to Drink plenty of water. . Do not consume any caffeinated/decaffeinated beverages or chocolate 12 hours prior to your test. .  On the Day of the Test: . Drink plenty of water. Do not drink any water within one hour of the test. . Do not eat any food 4 hours prior to the test. . You may take your regular medications prior to the test.  . Take metoprolol (Lopressor) two hours prior to test.                 -If HR is less than 55 BPM- No Lopressor                -IF HR is greater than 55 BPM and patient is less than or equal to 7 yrs old       Lopressor 100mg  x1.                      After the Test: . Drink plenty of water. . After receiving IV contrast, you may experience  a mild flushed feeling. This is normal. . On occasion, you may experience a mild rash up to 24 hours after the test. This is not dangerous. If this occurs, you can take Benadryl 25 mg and increase your fluid intake. . If you experience trouble breathing, this can be serious. If it is severe call 911 IMMEDIATELY. If it is mild, please call our office. . If you take any of these medications: Glipizide/Metformin, Avandament, Glucavance, please do not take 48 hours after completing test unless otherwise instructed.   Once we have confirmed authorization from your insurance company, we will call you to set up a date and time for your test.   For non-scheduling related questions, please contact the cardiac imaging nurse navigator should you have any questions/concerns: Marchia Bond, RN Navigator Cardiac Imaging [facility] [phone] office  For scheduling needs, including cancellations and rescheduling, please call [phone].      Follow-Up: At [facility], you and your health needs are our priority.  As part of our continuing mission to provide you with exceptional heart care, we have created designated Provider Care Teams.  These Care Teams include your  primary Cardiologist (physician) and Advanced Practice Providers (APPs -  Physician Assistants and Nurse Practitioners) who all work together to provide you with the care you need, when you need it.  We recommend signing up for the patient portal called "MyChart".  Sign up information is provided on this After Visit Summary.  MyChart is used to connect with patients for Virtual Visits (Telemedicine).  Patients are able to view lab/test results, encounter notes, upcoming appointments, etc.  Non-urgent messages can be sent to your provider as well.   To learn more about what you can do with MyChart, go to [email or web address].    Your next appointment:   1 year(s)  The format for your next appointment:   In  Person  Provider:   Candee Furbish, MD   Other Instructions

## 2019-07-22 ENCOUNTER — Telehealth (HOSPITAL_COMMUNITY): Payer: Self-pay | Admitting: *Deleted

## 2019-07-22 ENCOUNTER — Other Ambulatory Visit (HOSPITAL_COMMUNITY): Payer: Self-pay | Admitting: *Deleted

## 2019-07-22 MED ORDER — METOPROLOL TARTRATE 100 MG PO TABS: ORAL_TABLET | ORAL | 0 refills | Status: DC

## 2019-07-22 NOTE — Telephone Encounter (Signed)
Reaching out to patient to offer assistance regarding upcoming cardiac imaging study; pt verbalizes understanding of appt date/time, parking situation and where to check in, pre-test NPO status and medications ordered, and verified current allergies; name and call back number provided for further questions should they arise  [place] Tai RN Navigator Cardiac Imaging [facility] [phone] office [phone] cell 

## 2019-07-25 ENCOUNTER — Other Ambulatory Visit: Payer: Self-pay

## 2019-07-25 ENCOUNTER — Ambulatory Visit (HOSPITAL_COMMUNITY)
Admission: RE | Admit: 2019-07-25 | Discharge: 2019-07-25 | Disposition: A | Discharge status: Home / Self Care | Payer: 59 | Source: Ambulatory Visit | Attending: Cardiology | Admitting: Cardiology

## 2019-07-25 DIAGNOSIS — R079 Chest pain, unspecified: Secondary | ICD-10-CM

## 2019-07-25 MED ORDER — IOHEXOL 350 MG/ML SOLN
80.0000 mL | Freq: Once | INTRAVENOUS | Status: AC | PRN
  Administered 2019-07-25: 80 mL via INTRAVENOUS

## 2019-07-25 MED ORDER — NITROGLYCERIN 0.4 MG SL SUBL
0.8000 mg | SUBLINGUAL_TABLET | Freq: Once | SUBLINGUAL | Status: AC
  Administered 2019-07-25: 0.8 mg via SUBLINGUAL

## 2019-07-25 MED ORDER — NITROGLYCERIN 0.4 MG SL SUBL
SUBLINGUAL_TABLET | SUBLINGUAL | Status: AC
  Filled 2019-07-25: qty 2

## 2019-07-27 ENCOUNTER — Telehealth: Payer: Self-pay | Admitting: *Deleted

## 2019-07-27 NOTE — Telephone Encounter (Signed)
Patient notified. He is asking if Crestor would be a long term medication.  I told him this would be an ongoing medication.  He reports he does not do well with medicines long term and would like to think about it before starting Crestor.  He will call us back with an update

## 2019-07-27 NOTE — Telephone Encounter (Signed)
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 Non flow limiting.   Given calcified non flow limiting plaque, recommend starting Crestor 20mg  PO QD and rechecking lipids in 3 months with ALT. Candee Furbish, MD

## 2019-07-28 NOTE — Telephone Encounter (Signed)
Thank you - will close this encounter and wait for pt's return call once he decides about starting Crestor

## 2019-08-09 NOTE — Telephone Encounter (Signed)
I would be happy to discuss in a follow up appointment.  Candee Furbish, MD

## 2019-08-10 ENCOUNTER — Other Ambulatory Visit: Payer: Self-pay

## 2019-08-10 ENCOUNTER — Encounter: Payer: Self-pay | Admitting: Podiatry

## 2019-08-10 ENCOUNTER — Ambulatory Visit (INDEPENDENT_AMBULATORY_CARE_PROVIDER_SITE_OTHER): Payer: 59 | Admitting: Podiatry

## 2019-08-10 DIAGNOSIS — L600 Ingrowing nail: Secondary | ICD-10-CM

## 2019-08-10 MED ORDER — NEOMYCIN-POLYMYXIN-HC 3.5-10000-1 OT SOLN
OTIC | 0 refills | Status: DC
Start: 2019-08-10 — End: 2020-04-05

## 2019-08-10 NOTE — Progress Notes (Signed)
Subjective:   Patient ID: Casey Miles, male   DOB: 54 y.o.   MRN: [id number]   HPI Patient states he had his nail removed hallux right in December 2019 and it did not grow back normally and is developed an ingrown toenail on the lateral side that is painful and impossible for him to trim   Review of Systems  All other systems reviewed and are negative.       Objective:  Physical Exam Vitals and nursing note reviewed.  Constitutional:      Appearance: He is well-developed.  Pulmonary:     Effort: Pulmonary effort is normal.  Musculoskeletal:        General: Normal range of motion.  Skin:    General: Skin is warm.  Neurological:     Mental Status: He is alert.     Neurovascular status intact muscle strength found to be adequate range of motion within normal limits with patient found to have incurvated lateral border right hallux that is irritating the corner with redness but no active drainage noted.  Patient's right hallux nail is discolored slightly loose but overall relatively well adhered.  Patient has good digital perfusion well oriented     Assessment:  Ingrown toenail deformity right hallux lateral border that is painful along with damaged right hallux nail with discoloration dorsal surface slight movement of the nailbed     Plan:  H&P reviewed conditions and I recommended correction of the ingrown toenail and allowing the rest of the nail to grow out even though eventually total nail may be necessary.  Patient wants to undergo this and today I explained risk and he signed consent form and I infiltrated the right hallux 60 mg Xylocaine mixture and I went ahead and using sterile instrumentation remove the lateral border exposed the matrix and applied phenol 3 applications 30 seconds followed by alcohol lavage sterile dressing.  Gave instructions on soaks and patient will be seen back and is encouraged to call with questions and will leave dressing on 24 hours but take it off  earlier if any throbbing were to occur

## 2019-08-10 NOTE — Patient Instructions (Signed)

## 2019-08-12 ENCOUNTER — Other Ambulatory Visit: Payer: Self-pay | Admitting: *Deleted

## 2019-08-12 DIAGNOSIS — R931 Abnormal findings on diagnostic imaging of heart and coronary circulation: Secondary | ICD-10-CM

## 2019-08-12 DIAGNOSIS — Z79899 Other long term (current) drug therapy: Secondary | ICD-10-CM

## 2019-08-12 MED ORDER — ROSUVASTATIN CALCIUM 20 MG PO TABS
20.0000 mg | ORAL_TABLET | Freq: Every day | ORAL | 3 refills | Status: DC
Start: 2019-08-12 — End: 2019-11-18

## 2019-11-15 ENCOUNTER — Other Ambulatory Visit: Payer: Self-pay

## 2019-11-15 ENCOUNTER — Other Ambulatory Visit: Payer: 59

## 2019-11-15 DIAGNOSIS — Z79899 Other long term (current) drug therapy: Secondary | ICD-10-CM

## 2019-11-15 DIAGNOSIS — R931 Abnormal findings on diagnostic imaging of heart and coronary circulation: Secondary | ICD-10-CM

## 2019-11-15 LAB — LIPID PANEL
Chol/HDL Ratio: 3.7 ratio (ref 0.0–5.0)
Cholesterol, Total: 150 mg/dL (ref 100–199)
HDL: 41 mg/dL (ref >39)
LDL Chol Calc (NIH): 78 mg/dL (ref 0–99)
Triglycerides: 184 mg/dL — ABNORMAL HIGH (ref 0–149)
VLDL Cholesterol Cal: 31 mg/dL (ref 5–40)

## 2019-11-15 LAB — ALT: ALT: 25 IU/L (ref 0–44)

## 2019-11-18 ENCOUNTER — Other Ambulatory Visit: Payer: Self-pay | Admitting: *Deleted

## 2019-11-18 MED ORDER — ROSUVASTATIN CALCIUM 40 MG PO TABS
40.0000 mg | ORAL_TABLET | Freq: Every day | ORAL | 3 refills | Status: DC
Start: 2019-11-18 — End: 2019-12-27

## 2019-12-23 ENCOUNTER — Other Ambulatory Visit: Payer: Self-pay | Admitting: Surgery

## 2019-12-23 DIAGNOSIS — R932 Abnormal findings on diagnostic imaging of liver and biliary tract: Secondary | ICD-10-CM

## 2019-12-27 ENCOUNTER — Other Ambulatory Visit: Payer: Self-pay | Admitting: *Deleted

## 2019-12-27 MED ORDER — ROSUVASTATIN CALCIUM 40 MG PO TABS: 40.0000 mg | ORAL_TABLET | Freq: Every day | ORAL | 3 refills | Status: DC

## 2019-12-27 NOTE — Progress Notes (Signed)
crestor 40 mg tablet #90 X 3 sent into pharmacy of choice at pt's request.

## 2020-01-12 ENCOUNTER — Ambulatory Visit
Admission: RE | Admit: 2020-01-12 | Discharge: 2020-01-12 | Disposition: A | Discharge status: Home / Self Care | Payer: 59 | Source: Ambulatory Visit | Attending: Surgery | Admitting: Surgery

## 2020-01-12 ENCOUNTER — Other Ambulatory Visit: Payer: 59

## 2020-01-12 DIAGNOSIS — R932 Abnormal findings on diagnostic imaging of liver and biliary tract: Secondary | ICD-10-CM

## 2020-04-05 ENCOUNTER — Ambulatory Visit (INDEPENDENT_AMBULATORY_CARE_PROVIDER_SITE_OTHER): Payer: 59 | Admitting: Family Medicine

## 2020-04-05 ENCOUNTER — Other Ambulatory Visit: Payer: Self-pay

## 2020-04-05 ENCOUNTER — Encounter: Payer: Self-pay | Admitting: Family Medicine

## 2020-04-05 VITALS — BP 118/76 | HR 76 | Temp 98.6°F | Resp 16 | Ht 68.0 in | Wt 222.6 lb

## 2020-04-05 DIAGNOSIS — Z0000 Encounter for general adult medical examination without abnormal findings: Secondary | ICD-10-CM

## 2020-04-05 DIAGNOSIS — E349 Endocrine disorder, unspecified: Secondary | ICD-10-CM

## 2020-04-05 DIAGNOSIS — E782 Mixed hyperlipidemia: Secondary | ICD-10-CM

## 2020-04-05 DIAGNOSIS — R739 Hyperglycemia, unspecified: Secondary | ICD-10-CM

## 2020-04-05 DIAGNOSIS — Z7289 Other problems related to lifestyle: Secondary | ICD-10-CM

## 2020-04-05 DIAGNOSIS — Z860100 Personal history of colon polyps, unspecified: Secondary | ICD-10-CM | POA: Insufficient documentation

## 2020-04-05 DIAGNOSIS — K829 Disease of gallbladder, unspecified: Secondary | ICD-10-CM

## 2020-04-05 DIAGNOSIS — E559 Vitamin D deficiency, unspecified: Secondary | ICD-10-CM

## 2020-04-05 LAB — COMPREHENSIVE METABOLIC PANEL
ALT: 26 U/L (ref 0–53)
AST: 24 U/L (ref 0–37)
Albumin: 4.5 g/dL (ref 3.5–5.2)
Alkaline Phosphatase: 54 U/L (ref 39–117)
BUN: 14 mg/dL (ref 6–23)
CO2: 32 mEq/L (ref 19–32)
Calcium: 9.6 mg/dL (ref 8.4–10.5)
Chloride: 105 mEq/L (ref 96–112)
Creatinine, Ser: 1.19 mg/dL (ref 0.40–1.50)
GFR: 70.14 mL/min (ref >60.00)
Glucose, Bld: 107 mg/dL — ABNORMAL HIGH (ref 70–99)
Potassium: 4.6 mEq/L (ref 3.5–5.1)
Sodium: 141 mEq/L (ref 135–145)
Total Bilirubin: 0.9 mg/dL (ref 0.2–1.2)
Total Protein: 7.1 g/dL (ref 6.0–8.3)

## 2020-04-05 LAB — CBC WITH DIFFERENTIAL/PLATELET
Basophils Absolute: 0 10*3/uL (ref 0.0–0.1)
Basophils Relative: 0.5 % (ref 0.0–3.0)
Eosinophils Absolute: 0 10*3/uL (ref 0.0–0.7)
Eosinophils Relative: 1 % (ref 0.0–5.0)
HCT: 41.9 % (ref 39.0–52.0)
Hemoglobin: 14.7 g/dL (ref 13.0–17.0)
Lymphocytes Relative: 34 % (ref 12.0–46.0)
Lymphs Abs: 1.7 10*3/uL (ref 0.7–4.0)
MCHC: 35 g/dL (ref 30.0–36.0)
MCV: 91.7 fl (ref 78.0–100.0)
Monocytes Absolute: 0.5 10*3/uL (ref 0.1–1.0)
Monocytes Relative: 9.3 % (ref 3.0–12.0)
Neutro Abs: 2.7 10*3/uL (ref 1.4–7.7)
Neutrophils Relative %: 55.2 % (ref 43.0–77.0)
Platelets: 201 10*3/uL (ref 150.0–400.0)
RBC: 4.57 Mil/uL (ref 4.22–5.81)
RDW: 13 % (ref 11.5–15.5)
WBC: 5 10*3/uL (ref 4.0–10.5)

## 2020-04-05 LAB — TESTOSTERONE: Testosterone: 322.1 ng/dL (ref 300.00–890.00)

## 2020-04-05 LAB — LIPID PANEL
Cholesterol: 126 mg/dL (ref 0–200)
HDL: 41.6 mg/dL (ref >39.00)
LDL Cholesterol: 63 mg/dL (ref 0–99)
NonHDL: 83.92
Total CHOL/HDL Ratio: 3
Triglycerides: 107 mg/dL (ref 0.0–149.0)
VLDL: 21.4 mg/dL (ref 0.0–40.0)

## 2020-04-05 LAB — TSH: TSH: 1.14 u[IU]/mL (ref 0.35–4.50)

## 2020-04-05 LAB — HEMOGLOBIN A1C: Hgb A1c MFr Bld: 5.7 % (ref 4.6–6.5)

## 2020-04-05 LAB — PSA: PSA: 0.19 ng/mL (ref 0.10–4.00)

## 2020-04-05 NOTE — Assessment & Plan Note (Signed)
Ultrasound showed sludge and stones. Had an episode of epigastric pain after a fatty meal recently but that is infrequent.

## 2020-04-05 NOTE — Assessment & Plan Note (Signed)
Patient encouraged to maintain heart healthy diet, regular exercise, adequate sleep. Consider daily probiotics. Take medications as prescribed. Labs reviewed and ordered. Colonoscopy in 05/22/19, repeat in 3/24.

## 2020-04-05 NOTE — Patient Instructions (Signed)
Preventive Care 40-53 Years Old Old, Male Preventive care refers to lifestyle choices and visits with your health care provider that can promote health and wellness. This includes:  A yearly physical exam. This is also called an annual wellness visit.  Regular dental and eye exams.  Immunizations.  Screening for certain conditions.  Healthy lifestyle choices, such as: ? Eating a healthy diet. ? Getting regular exercise. ? Not using drugs or products that contain nicotine and tobacco. ? Limiting alcohol use. What can I expect for my preventive care visit? Physical exam Your health care provider will check your:  Height and weight. These may be used to calculate your BMI (body mass index). BMI is a measurement that tells if you are at a healthy weight.  Heart rate and blood pressure.  Body temperature.  Skin for abnormal spots. Counseling Your health care provider may ask you questions about your:  Past medical problems.  Family's medical history.  Alcohol, tobacco, and drug use.  Emotional well-being.  Home life and relationship well-being.  Sexual activity.  Diet, exercise, and sleep habits.  Work and work environment.  Access to firearms. What immunizations do I need? Vaccines are usually given at various ages, according to a schedule. Your health care provider will recommend vaccines for you based on your age, medical history, and lifestyle or other factors, such as travel or where you work.   What tests do I need? Blood tests  Lipid and cholesterol levels. These may be checked every 5 years, or more often if you are over 53 years old.  Hepatitis C test.  Hepatitis B test. Screening  Lung cancer screening. You may have this screening every year starting at age 53 if you have a 30-pack-year history of smoking and currently smoke or have quit within the past 15 years.  Prostate cancer screening. Recommendations will vary depending on your family history and  other risks.  Genital exam to check for testicular cancer or hernias.  Colorectal cancer screening. ? All adults should have this screening starting at age 53 and continuing until age 75. ? Your health care provider may recommend screening at age 53 if you are at increased risk. ? You will have tests every 1-10 years, depending on your results and the type of screening test.  Diabetes screening. ? This is done by checking your blood sugar (glucose) after you have not eaten for a while (fasting). ? You may have this done every 1-3 years.  STD (sexually transmitted disease) testing, if you are at risk. Follow these instructions at home: Eating and drinking  Eat a diet that includes fresh fruits and vegetables, whole grains, lean protein, and low-fat dairy products.  Take vitamin and mineral supplements as recommended by your health care provider.  Do not drink alcohol if your health care provider tells you not to drink.  If you drink alcohol: ? Limit how much you have to 0-2 drinks a day. ? Be aware of how much alcohol is in your drink. In the [place] one drink equals one 12 oz bottle of beer (355 mL), one 5 oz glass of wine (148 mL), or one 1 oz glass of hard liquor (44 mL).   Lifestyle  Take daily care of your teeth and gums. Brush your teeth every morning and night with fluoride toothpaste. Floss one time each day.  Stay active. Exercise for at least 30 minutes 5 or more days each week.  Do not use any products that contain nicotine or   tobacco, such as cigarettes, e-cigarettes, and chewing tobacco. If you need help quitting, ask your health care provider.  Do not use drugs.  If you are sexually active, practice safe sex. Use a condom or other form of protection to prevent STIs (sexually transmitted infections).  If told by your health care provider, take low-dose aspirin daily starting at age 53.  Find healthy ways to cope with stress, such as: ? Meditation, yoga, or  listening to music. ? Journaling. ? Talking to a trusted person. ? Spending time with friends and family. Safety  Always wear your seat belt while driving or riding in a vehicle.  Do not drive: ? If you have been drinking alcohol. Do not ride with someone who has been drinking. ? When you are tired or distracted. ? While texting.  Wear a helmet and other protective equipment during sports activities.  If you have firearms in your house, make sure you follow all gun safety procedures. What's next?  Go to your health care provider once a year for an annual wellness visit.  Ask your health care provider how often you should have your eyes and teeth checked.  Stay up to date on all vaccines. This information is not intended to replace advice given to you by your health care provider. Make sure you discuss any questions you have with your health care provider. Document Revised: 11/16/2018 Document Reviewed: 02/11/2018 Elsevier Patient Education  2021 Elsevier Inc.  

## 2020-04-05 NOTE — Assessment & Plan Note (Signed)
Supplement and monitor 

## 2020-04-05 NOTE — Assessment & Plan Note (Signed)
Colonoscopy in 3/21 showed a precancerous polyp repeat in 3/24

## 2020-04-05 NOTE — Assessment & Plan Note (Signed)
Encouraged heart healthy diet, increase exercise, avoid trans fats, consider a krill oil cap daily 

## 2020-04-05 NOTE — Progress Notes (Signed)
Subjective:    Patient ID: Casey Miles, male    DOB: Feb 19, 1966, 55 y.o.   MRN: [id number]  No chief complaint on file.   HPI Patient is in today for annual preventative exam and follow up on chronic medical concerns. No recent febrile illness or hospitalizations. He has continued to stay active at work during the pandemic. Has a teenage son at home who is pushing boundaries but they are doing OK most of the time. He acknowledges he has not been exercising regularly. No polyuria or polydipsia. He had some epigastric/right upper quadrant pain after eating a fatty meal recently. Denies palp/SOB/HA/congestion/fevers or GU c/o. Taking meds as prescribed Past Medical History:  Diagnosis Date  . Abdominal pain, epigastric 09/26/2009  . Allergy    possible per pt - no dx   . Arthralgia of multiple sites 08/13/2010  . CHEST PAIN, ATYPICAL 05/04/2008  . Chest wall pain   . Chronic rhinitis 09/26/2009  . COSTOCHONDRITIS, RECURRENT 05/06/2010  . 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 Elevated BP 10/08/2011  . Elevated LFTs 06/19/2016  . Fatigue 01/08/2011  . GERD (gastroesophageal reflux disease)   . Hyperglycemia 06/17/2016  . Hyperlipidemia 10/08/2010  . Kidney stone   . NECK PAIN 09/26/2009  . Neuromuscular disorder (HCC)    C4C5 herniated disc   . Other acute reactions to stress 09/26/2009  . Other and unspecified hyperlipidemia 01/23/2013  . PARESTHESIA 11/22/2009  . Preventative health care 08/01/2012  . SHOULDER PAIN, LEFT 07/17/2008  . Testosterone deficiency 07/09/2011  . Vitamin D deficiency 07/08/2011    Past Surgical History:  Procedure Laterality Date  . CARDIAC CATHETERIZATION     2013-2014  . UMBILICAL HERNIA REPAIR  2017  . UPPER GASTROINTESTINAL ENDOSCOPY      Family History  Problem Relation Age of Onset  . Diabetes Mother   . Hypertension Mother   . Hyperlipidemia Mother   . Diabetes Maternal Grandmother   . Heart disease Maternal Grandmother   . Colitis  Maternal Grandmother   . Diabetes Paternal Grandmother   . Colitis Maternal Grandfather   . Heart disease Maternal Grandfather   . Heart attack Father [id number]  . Lupus Cousin   . Irritable bowel syndrome Other   . Cancer Other        Esophageal- Great Grandfather  . Stomach cancer Other        Uncle  . Colon polyps Other        Grandmother  . Diabetes Other        Family history of  . Hypertension Other        Family history of  . Heart attack Other        Family history of  . Irritable bowel syndrome Other        Family history of  . Colon cancer Neg Hx   . Esophageal cancer Neg Hx   . Rectal cancer Neg Hx     Social History   Socioeconomic History  . Marital status: Married    Spouse name: Not on file  . Number of children: Not on file  . Years of education: Not on file  . Highest education level: Not on file  Occupational History  . Not on file  Tobacco Use  . Smoking status: Never Smoker  . Smokeless tobacco: Never Used  Vaping Use  . Vaping Use: Never used  Substance and Sexual Activity  . Alcohol use: Yes    Alcohol/week: 1.0 -  2.0 standard drink    Types: 1 - 2 Standard drinks or equivalent per week    Comment: 1-2 per week  . Drug use: No  . Sexual activity: Yes    Comment: lives wife and children, exercises intermittently, heart healthy diet.  Other Topics Concern  . Not on file  Social History Narrative  . Not on file   Social Determinants of Health   Financial Resource Strain: Not on file  Food Insecurity: Not on file  Transportation Needs: Not on file  Physical Activity: Not on file  Stress: Not on file  Social Connections: Not on file  Intimate Partner Violence: Not on file    Outpatient Medications Prior to Visit  Medication Sig Dispense Refill  . Multiple Vitamins-Minerals (CENTRUM ADULTS) TABS     . rosuvastatin (CRESTOR) 40 MG tablet Take 1 tablet (40 mg total) by mouth daily. 90 tablet 3  . aspirin (ASPIRIN LOW DOSE) 81 MG EC tablet  Take 81 mg by mouth daily.     . cholecalciferol (VITAMIN D3) 25 MCG (1000 UNIT) tablet Take 1,000 Units by mouth daily. Every day but Thursday    . metoprolol tartrate (LOPRESSOR) 100 MG tablet Take one tablet (100 mg) by mouth two hours prior to your cardiac CT. Do not take if your heart rate is less than 55 BPM. 1 tablet 0  . neomycin-polymyxin-hydrocortisone (CORTISPORIN) OTIC solution Apply 1-2 drops to toe after soaking twice a day 10 mL 0  . nystatin cream (MYCOSTATIN) Apply 1 application topically 2 (two) times daily. 90 g 1  . terbinafine (LAMISIL AT) 1 % cream Apply 1 application topically 2 (two) times daily. 30 g 0  . Vitamin D, Ergocalciferol, (DRISDOL) 1.25 MG (50000 UNIT) CAPS capsule TAKE 1 CAPSULE (50,000 UNITS TOTAL) BY MOUTH EVERY 7 (SEVEN) DAYS. 12 capsule 0   No facility-administered medications prior to visit.    No Known Allergies  Review of Systems  Constitutional: Negative for chills, fever and malaise/fatigue.  HENT: Negative for congestion and hearing loss.   Eyes: Negative for discharge.  Respiratory: Negative for cough, sputum production and shortness of breath.   Cardiovascular: Positive for chest pain. Negative for palpitations and leg swelling.  Gastrointestinal: Positive for abdominal pain. Negative for blood in stool, constipation, diarrhea, heartburn, nausea and vomiting.  Genitourinary: Negative for dysuria, frequency, hematuria and urgency.  Musculoskeletal: Negative for back pain, falls and myalgias.  Skin: Negative for rash.  Neurological: Negative for dizziness, sensory change, loss of consciousness, weakness and headaches.  Endo/Heme/Allergies: Negative for environmental allergies. Does not bruise/bleed easily.  Psychiatric/Behavioral: Negative for depression and suicidal ideas. The patient is not nervous/anxious and does not have insomnia.        Objective:    Physical Exam Vitals and nursing note reviewed.  Constitutional:      General: He  is not in acute distress.    Appearance: Normal appearance. He is well-developed and well-nourished. He is not ill-appearing.  HENT:     Head: Normocephalic and atraumatic.     Right Ear: Tympanic membrane, ear canal and external ear normal.     Left Ear: Tympanic membrane, ear canal and external ear normal.  Eyes:     General:        Right eye: No discharge.        Left eye: No discharge.     Extraocular Movements: Extraocular movements intact.     Conjunctiva/sclera: Conjunctivae normal.     Pupils: Pupils are equal,  round, and reactive to light.  Cardiovascular:     Rate and Rhythm: Normal rate and regular rhythm.     Pulses: Normal pulses.     Heart sounds: Normal heart sounds. No murmur heard.   Pulmonary:     Effort: Pulmonary effort is normal.     Breath sounds: Normal breath sounds. No wheezing.  Abdominal:     General: Bowel sounds are normal.     Palpations: Abdomen is soft. There is no mass.     Tenderness: There is no abdominal tenderness. There is no guarding or rebound.  Musculoskeletal:        General: No edema.     Cervical back: Normal range of motion and neck supple.  Skin:    General: Skin is warm and dry.  Neurological:     Mental Status: He is alert and oriented to person, place, and time.  Psychiatric:        Mood and Affect: Mood and affect normal.        Behavior: Behavior normal.     BP 118/76   Pulse 76   Temp 98.6 F (37 C)   Resp 16   Ht 5\' 8"  (1.727 m)   Wt 222 lb 9.6 oz (101 kg)   SpO2 98%   BMI 33.85 kg/m  Wt Readings from Last 3 Encounters:  04/05/20 222 lb 9.6 oz (101 kg)  06/30/19 220 lb (99.8 kg)  05/24/19 215 lb (97.5 kg)    Diabetic Foot Exam - Simple   No data filed    Lab Results  Component Value Date   WBC 5.0 04/05/2020   HGB 14.7 04/05/2020   HCT 41.9 04/05/2020   PLT 201.0 04/05/2020   GLUCOSE 107 (H) 04/05/2020   CHOL 126 04/05/2020   TRIG 107.0 04/05/2020   HDL 41.60 04/05/2020   LDLDIRECT 88.0  01/06/2019   LDLCALC 63 04/05/2020   ALT 26 04/05/2020   AST 24 04/05/2020   NA 141 04/05/2020   K 4.6 04/05/2020   CL 105 04/05/2020   CREATININE 1.19 04/05/2020   BUN 14 04/05/2020   CO2 32 04/05/2020   TSH 1.14 04/05/2020   PSA 0.19 04/05/2020   INR 0.96 03/12/2010   HGBA1C 5.7 04/05/2020    Lab Results  Component Value Date   TSH 1.14 04/05/2020   Lab Results  Component Value Date   WBC 5.0 04/05/2020   HGB 14.7 04/05/2020   HCT 41.9 04/05/2020   MCV 91.7 04/05/2020   PLT 201.0 04/05/2020   Lab Results  Component Value Date   NA 141 04/05/2020   K 4.6 04/05/2020   CO2 32 04/05/2020   GLUCOSE 107 (H) 04/05/2020   BUN 14 04/05/2020   CREATININE 1.19 04/05/2020   BILITOT 0.9 04/05/2020   ALKPHOS 54 04/05/2020   AST 24 04/05/2020   ALT 26 04/05/2020   PROT 7.1 04/05/2020   ALBUMIN 4.5 04/05/2020   CALCIUM 9.6 04/05/2020   ANIONGAP 10 08/03/2017   GFR 70.14 04/05/2020   Lab Results  Component Value Date   CHOL 126 04/05/2020   Lab Results  Component Value Date   HDL 41.60 04/05/2020   Lab Results  Component Value Date   LDLCALC 63 04/05/2020   Lab Results  Component Value Date   TRIG 107.0 04/05/2020   Lab Results  Component Value Date   CHOLHDL 3 04/05/2020   Lab Results  Component Value Date   HGBA1C 5.7 04/05/2020  Assessment & Plan:   Problem List Items Addressed This Visit    Vitamin D deficiency    Supplement and monitor      Relevant Orders   Vitamin D 1,25 dihydroxy   Testosterone deficiency    Supplement and monitor      Relevant Orders   PSA (Completed)   Testosterone (Completed)   Preventative health care    Patient encouraged to maintain heart healthy diet, regular exercise, adequate sleep. Consider daily probiotics. Take medications as prescribed. Labs reviewed and ordered. Colonoscopy in 05/22/19, repeat in 3/24.      Relevant Orders   CBC with Differential/Platelet (Completed)   Comprehensive metabolic  panel (Completed)   TSH (Completed)   Mixed hyperlipidemia    Encouraged heart healthy diet, increase exercise, avoid trans fats, consider a krill oil cap daily      Relevant Orders   Lipid panel (Completed)   Hyperglycemia    hgba1c acceptable, minimize simple carbs. Increase exercise as tolerated.       Relevant Orders   Hemoglobin A1c (Completed)   Hx of colonic polyp    Colonoscopy in 3/21 showed a precancerous polyp repeat in 3/24      Gallbladder disorder    Ultrasound showed sludge and stones. Had an episode of epigastric pain after a fatty meal recently but that is infrequent.        Other Visit Diagnoses    Other problems related to lifestyle    -  Primary   Relevant Orders   Hepatitis C Antibody      I have discontinued Parthiv [facility]'s nystatin cream, terbinafine, aspirin, cholecalciferol, Vitamin D (Ergocalciferol), metoprolol tartrate, and neomycin-polymyxin-hydrocortisone. I am also having him maintain his Centrum Adults and rosuvastatin.  No orders of the defined types were placed in this encounter.    Penni Homans, MD

## 2020-04-05 NOTE — Assessment & Plan Note (Signed)
hgba1c acceptable, minimize simple carbs. Increase exercise as tolerated.  

## 2020-04-12 LAB — HEPATITIS C ANTIBODY
Hepatitis C Ab: NONREACTIVE
SIGNAL TO CUT-OFF: 0.01 (ref <1.00)

## 2020-04-12 LAB — VITAMIN D 1,25 DIHYDROXY
Vitamin D 1, 25 (OH)2 Total: 37 pg/mL (ref 18–72)
Vitamin D2 1, 25 (OH)2: 10 pg/mL
Vitamin D3 1, 25 (OH)2: 27 pg/mL

## 2020-10-04 ENCOUNTER — Ambulatory Visit: Payer: 59 | Admitting: Family Medicine

## 2020-11-28 MED ORDER — ROSUVASTATIN CALCIUM 40 MG PO TABS: 40.0000 mg | ORAL_TABLET | Freq: Every day | ORAL | 0 refills | Status: DC

## 2020-12-20 ENCOUNTER — Other Ambulatory Visit: Payer: Self-pay | Admitting: Cardiology

## 2020-12-24 ENCOUNTER — Ambulatory Visit (HOSPITAL_BASED_OUTPATIENT_CLINIC_OR_DEPARTMENT_OTHER): Payer: 59 | Admitting: Cardiology

## 2021-01-17 ENCOUNTER — Encounter: Payer: Self-pay | Admitting: Cardiology

## 2021-01-17 ENCOUNTER — Ambulatory Visit: Payer: 59 | Admitting: Cardiology

## 2021-01-17 ENCOUNTER — Other Ambulatory Visit: Payer: Self-pay

## 2021-01-17 DIAGNOSIS — I2510 Atherosclerotic heart disease of native coronary artery without angina pectoris: Secondary | ICD-10-CM | POA: Diagnosis not present

## 2021-01-17 DIAGNOSIS — Z8249 Family history of ischemic heart disease and other diseases of the circulatory system: Secondary | ICD-10-CM

## 2021-01-17 DIAGNOSIS — R000 Tachycardia, unspecified: Secondary | ICD-10-CM

## 2021-01-17 DIAGNOSIS — E782 Mixed hyperlipidemia: Secondary | ICD-10-CM

## 2021-01-17 NOTE — Assessment & Plan Note (Signed)
Previously discussed that his increase in exercise was within standard limits.  Occasionally will notice heart rates in the mid 40s when relaxing on the couch.  Asymptomatic.  Not unusual.  Had a dizzy episode where his heart rate was in the 60s.  Benign.

## 2021-01-17 NOTE — Assessment & Plan Note (Signed)
Continue with Crestor 40 mg a day high intensity dose.  LDL goal less than 70.  63 LDL last checked.  Excellent.  No myalgias.

## 2021-01-17 NOTE — Patient Instructions (Signed)
Medication Instructions:  Your physician recommends that you continue on your current medications as directed. Please refer to the Current Medication list given to you today.  *If you need a refill on your cardiac medications before your next appointment, please call your pharmacy*   Lab Work: None If you have labs (blood work) drawn today and your tests are completely normal, you will receive your results only by: [facility] (if you have MyChart) OR A paper copy in the mail If you have any lab test that is abnormal or we need to change your treatment, we will call you to review the results.   Follow-Up: At [facility], you and your health needs are our priority.  As part of our continuing mission to provide you with exceptional heart care, we have created designated Provider Care Teams.  These Care Teams include your primary Cardiologist (physician) and Advanced Practice Providers (APPs -  Physician Assistants and Nurse Practitioners) who all work together to provide you with the care you need, when you need it.  Your next appointment:   1 year(s)  The format for your next appointment:   In Person  Provider:   Candee Furbish, MD {

## 2021-01-17 NOTE — Assessment & Plan Note (Signed)
2021 coronary calcium score of 31 with mild focal plaque of the LAD nonflow limiting.  Continue with goal-directed medical therapy.  LDL goal less than 70.  On rosuvastatin 40 mg a day.  Consider low-dose aspirin 81 mg.  Diet, exercise.

## 2021-01-17 NOTE — Progress Notes (Signed)
Cardiology Office Note:    Date:  01/17/2021   ID:  CAEDIN MOGAN, DOB 16-Aug-1965, MRN [id number]  PCP:  Mosie Lukes, MD  Cardiologist:  Candee Furbish, MD  Electrophysiologist:  None   Referring MD: Mosie Lukes, MD   History of Present Illness:    Casey Miles is a 55 y.o. male here for the follow-up of hypertension and hyperlipidemia, mild coronary artery disease seen on coronary CT scan 2021, strong family history of early CAD.  Cardiac catheterization 2012 showed no CAD. Echo 2015-normal pump function  At his last appointment: Heart rate increased to 120 with mild effort. Dropped to 148 at one point for 20 minutes then stopped. Seem to be within normal limits . Father had a massive heart attack in his early 29s.  Prior EKG showed sinus rhythm 66  Has gained weight. 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   Has CP off and on. Left localized and across back. Spinal issues, could be nerves. Tingling, left arm, axilla.    Today:  He says he has been okay. He denies have any chest pain or shortness of breath but he does say he has nerve related chest discomfort he had from the last appointment.   Since he has started rosuvastatin his cholesterol has been managed.  He denies any palpitations, or shortness of breath. No lightheadedness, headaches, syncope, orthopnea, PND, lower extremity edema or exertional symptoms.  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   Elevated BP 10/08/2011   Elevated LFTs 06/19/2016   Fatigue 01/08/2011   GERD (gastroesophageal reflux disease)    Hyperglycemia 06/17/2016   Hyperlipidemia 10/08/2010   Kidney stone    NECK PAIN 09/26/2009    Neuromuscular disorder ([place])    C4C5 herniated disc    Other acute reactions to stress 09/26/2009   Other and unspecified hyperlipidemia 01/23/2013   PARESTHESIA 11/22/2009   Preventative health care 08/01/2012   SHOULDER PAIN, LEFT 07/17/2008   Testosterone deficiency 07/09/2011   Vitamin D deficiency 07/08/2011    Past Surgical History:  Procedure Laterality Date   CARDIAC CATHETERIZATION     [id number]   UMBILICAL HERNIA REPAIR  2017   UPPER GASTROINTESTINAL ENDOSCOPY      Current Medications: Current Meds  Medication Sig   rosuvastatin (CRESTOR) 40 MG tablet TAKE 1 TABLET BY MOUTH EVERY DAY     Allergies:   Patient has no known allergies.   Social History   Socioeconomic History   Marital status: Married    Spouse name: Not on file   Number of children: Not on file   Years of education: Not on file   Highest education level: Not on file  Occupational History   Not on file  Tobacco Use   Smoking status: Never   Smokeless tobacco: Never  Vaping Use   Vaping Use: Never used  Substance and Sexual Activity   Alcohol use: Yes    Alcohol/week: 1.0 - 2.0 standard drink    Types: 1 - 2 Standard drinks or equivalent per week    Comment: 1-2 per week   Drug use: No   Sexual activity:  Yes    Comment: lives wife and children, exercises intermittently, heart healthy diet.  Other Topics Concern   Not on file  Social History Narrative   Not on file   Social Determinants of Health   Financial Resource Strain: Not on file  Food Insecurity: Not on file  Transportation Needs: Not on file  Physical Activity: Not on file  Stress: Not on file  Social Connections: Not on file     Family History: The patient's family history includes Cancer in an other family member; Colitis in his maternal grandfather and maternal grandmother; Colon polyps in an other family member; Diabetes in his maternal grandmother, mother, paternal grandmother, and another family member; Heart attack in an  other family member; Heart attack (age of onset: 32) in his father; Heart disease in his maternal grandfather and maternal grandmother; Hyperlipidemia in his mother; Hypertension in his mother and another family member; Irritable bowel syndrome in some other family members; Lupus in his cousin; Stomach cancer in an other family member. There is no history of Colon cancer, Esophageal cancer, or Rectal cancer.  ROS:   Please see the history of present illness.  All other systems are reviewed and negative.   EKGs/Labs/Other Studies Reviewed:    The following studies were reviewed today: As above  Cardiac CTA with Calcium Score 07/25/2019: IMPRESSION: 1. Coronary calcium score of 31. This was 57 percentile for age and sex matched control.   2. Normal coronary origin with LEFT dominance. (RCA is small and originates high from non coronary cusp).   3. Mild calcified focal plaque of LAD that is non flow limiting (0-24%).   4. CAD-RADS 1. Minimal non-obstructive CAD (0-24%). Consider non-atherosclerotic causes of chest pain. Consider preventive therapy and risk factor modification.  EKG:  EKG is personally reviewed and interpreted. 01/17/2021: Sinus rhythm. Rate 68 bpm. No other changes. 06/30/2019: sinus rhythm 76 no other significant abnormalities  Recent Labs: 04/05/2020: ALT 26; BUN 14; Creatinine, Ser 1.19; Hemoglobin 14.7; Platelets 201.0; Potassium 4.6; Sodium 141; TSH 1.14  Recent Lipid Panel    Component Value Date/Time   CHOL 126 04/05/2020 0915   CHOL 150 11/15/2019 0816   TRIG 107.0 04/05/2020 0915   HDL 41.60 04/05/2020 0915   HDL 41 11/15/2019 0816   CHOLHDL 3 04/05/2020 0915   VLDL 21.4 04/05/2020 0915   LDLCALC 63 04/05/2020 0915   LDLCALC 78 11/15/2019 0816   LDLDIRECT 88.0 01/06/2019 0941    Physical Exam:    VS:  BP 130/80 (BP Location: Left Arm, Patient Position: Sitting, Cuff Size: Normal)   Pulse 68   Ht 5\' 8"  (1.727 m)   Wt 225 lb (102.1 kg)   SpO2 98%    BMI 34.21 kg/m     Wt Readings from Last 3 Encounters:  01/17/21 225 lb (102.1 kg)  04/05/20 222 lb 9.6 oz (101 kg)  06/30/19 220 lb (99.8 kg)     GEN:  Well nourished, well developed in no acute distress HEENT: Normal NECK: No JVD; No carotid bruits LYMPHATICS: No lymphadenopathy CARDIAC: RRR, no murmurs, rubs, gallops RESPIRATORY:  Clear to auscultation without rales, wheezing or rhonchi  ABDOMEN: Soft, non-tender, non-distended MUSCULOSKELETAL:  No edema; No deformity  SKIN: Warm and dry NEUROLOGIC:  Alert and oriented x 3 PSYCHIATRIC:  Normal affect   ASSESSMENT:    1. Coronary artery disease involving native coronary artery of native heart without angina pectoris   2. Mixed hyperlipidemia   3. Tachycardia   4.  Family history of early CAD     PLAN:    In order of problems listed above:  Coronary artery disease involving native coronary artery of native heart without angina pectoris 2021 coronary calcium score of 31 with mild focal plaque of the LAD nonflow limiting.  Continue with goal-directed medical therapy.  LDL goal less than 70.  On rosuvastatin 40 mg a day.  Consider low-dose aspirin 81 mg.  Diet, exercise.  Mixed hyperlipidemia Continue with Crestor 40 mg a day high intensity dose.  LDL goal less than 70.  63 LDL last checked.  Excellent.  No myalgias.  Tachycardia Previously discussed that his increase in exercise was within standard limits.  Occasionally will notice heart rates in the mid 40s when relaxing on the couch.  Asymptomatic.  Not unusual.  Had a dizzy episode where his heart rate was in the 60s.  Benign.  Family history of early CAD Father had a massive heart attack at age 26.  Continue high intensity statin currently.  LDL at goal.  He has mild LAD stenosis.  Follow Up: 1 year APP would be reasonable  Medication Adjustments/Labs and Tests Ordered: Current medicines are reviewed at length with the patient today.  Concerns regarding medicines  are outlined above.   Orders Placed This Encounter  Procedures   EKG 12-Lead    No orders of the defined types were placed in this encounter.  Patient Instructions  Medication Instructions:  Your physician recommends that you continue on your current medications as directed. Please refer to the Current Medication list given to you today.  *If you need a refill on your cardiac medications before your next appointment, please call your pharmacy*   Lab Work: None If you have labs (blood work) drawn today and your tests are completely normal, you will receive your results only by: [place] (if you have MyChart) OR A paper copy in the mail If you have any lab test that is abnormal or we need to change your treatment, we will call you to review the results.   Follow-Up: At [facility], you and your health needs are our priority.  As part of our continuing mission to provide you with exceptional heart care, we have created designated Provider Care Teams.  These Care Teams include your primary Cardiologist (physician) and Advanced Practice Providers (APPs -  Physician Assistants and Nurse Practitioners) who all work together to provide you with the care you need, when you need it.  Your next appointment:   1 year(s)  The format for your next appointment:   In Person  Provider:   Candee Furbish, MD {     Rondell Reams as a scribe for Candee Furbish, MD.,have documented all relevant documentation on the behalf of Candee Furbish, MD,as directed by  Candee Furbish, MD while in the presence of Candee Furbish, MD.   I, Candee Furbish, MD, have reviewed all documentation for this visit. The documentation on 01/17/21 for the exam, diagnosis, procedures, and orders are all accurate and complete.   Signed, Candee Furbish, MD  01/17/2021 8:55 AM    [facility]

## 2021-01-17 NOTE — Assessment & Plan Note (Signed)
Father had a massive heart attack at age 53.  Continue high intensity statin currently.  LDL at goal.  He has mild LAD stenosis.

## 2021-01-20 ENCOUNTER — Encounter: Payer: Self-pay | Admitting: Cardiology

## 2021-01-21 ENCOUNTER — Other Ambulatory Visit: Payer: Self-pay | Admitting: *Deleted

## 2021-01-21 MED ORDER — ROSUVASTATIN CALCIUM 40 MG PO TABS: 40.0000 mg | ORAL_TABLET | Freq: Every day | ORAL | 3 refills | Status: DC

## 2021-03-19 ENCOUNTER — Ambulatory Visit: Payer: 59

## 2021-03-19 ENCOUNTER — Encounter: Payer: Self-pay | Admitting: Family Medicine

## 2021-11-24 ENCOUNTER — Other Ambulatory Visit: Payer: Self-pay | Admitting: Cardiology

## 2022-03-04 ENCOUNTER — Telehealth: Payer: Self-pay | Admitting: Cardiology

## 2022-03-04 MED ORDER — ROSUVASTATIN CALCIUM 40 MG PO TABS: 40.0000 mg | ORAL_TABLET | Freq: Every day | ORAL | 0 refills | Status: DC

## 2022-03-04 NOTE — Telephone Encounter (Signed)
*  STAT* If patient is at the pharmacy, call can be transferred to refill team.   1. Which medications need to be refilled? (please list name of each medication and dose if known)   rosuvastatin (CRESTOR) 40 MG tablet    2. Which pharmacy/location (including street and city if local pharmacy) is medication to be sent to? [facility] [place], [place] - [address]   3. Do they need a 30 day or 90 day supply?  90 day

## 2022-03-04 NOTE — Telephone Encounter (Signed)
Pt's medication was sent to pt's pharmacy as requested. Confirmation received.  °

## 2022-04-03 IMAGING — US US ABDOMEN LIMITED
1 series · 13 of 25 positions shown · non-contrast
Comparison: Abdominal ultrasound dated April 11, 2019. CT abdomen
pelvis dated August 03, 2017.

CLINICAL DATA: Follow-up possible gallbladder polyp.

EXAM:
ULTRASOUND ABDOMEN LIMITED RIGHT UPPER QUADRANT

[Series 1: us abdomen limited · 0.23mm/px · 68 acquisitions, 13 frames shown]
[im 1/68]
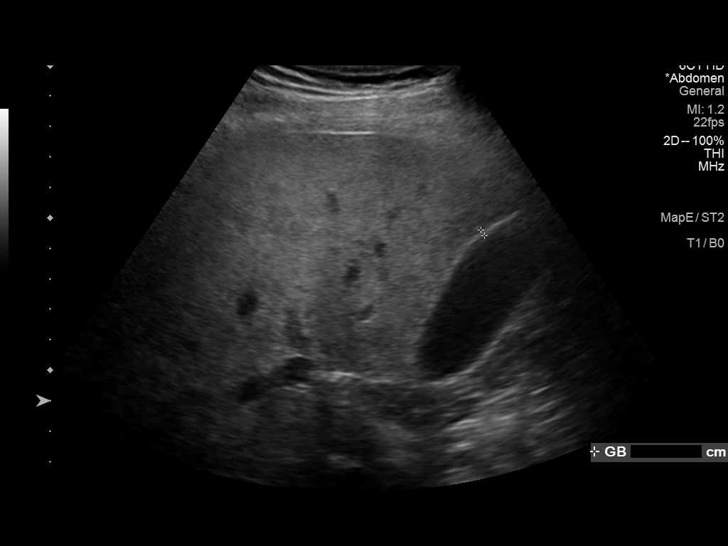
[im 6/68]
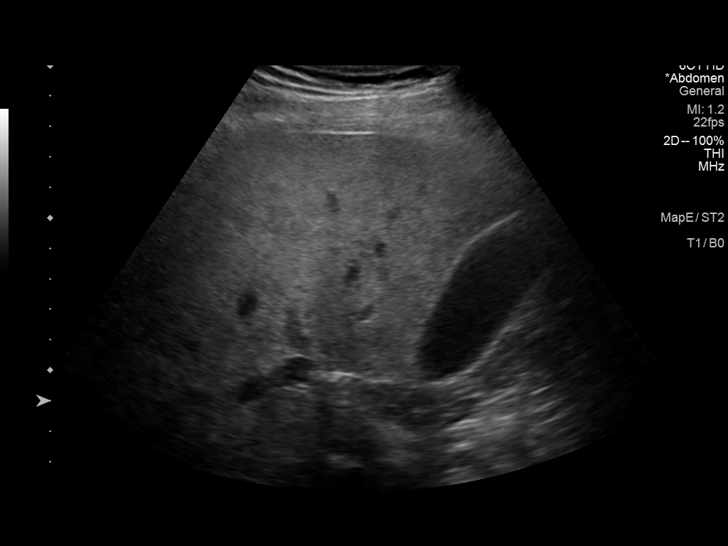
[im 12/68]
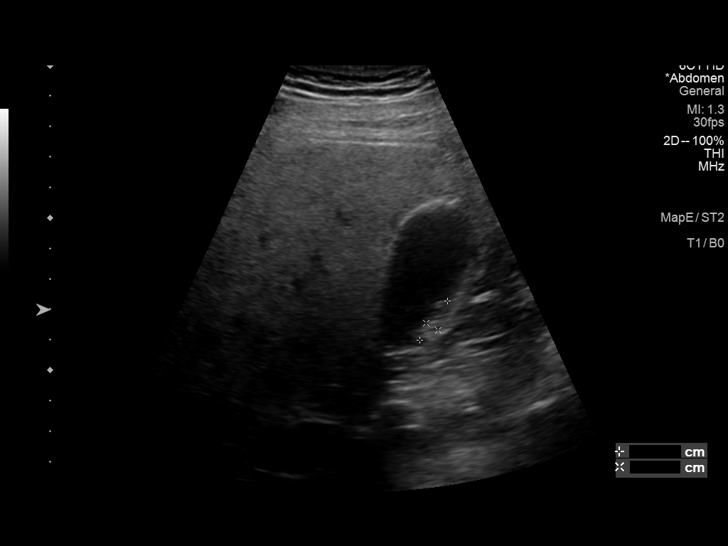
[im 17/68]
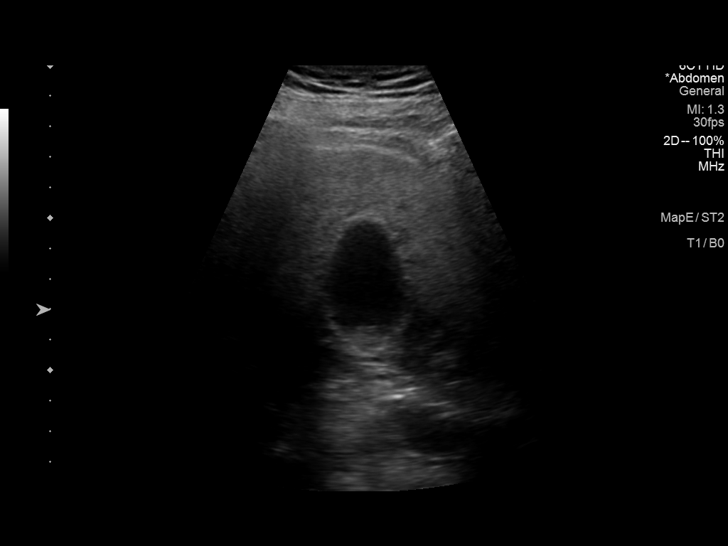
[im 23/68]
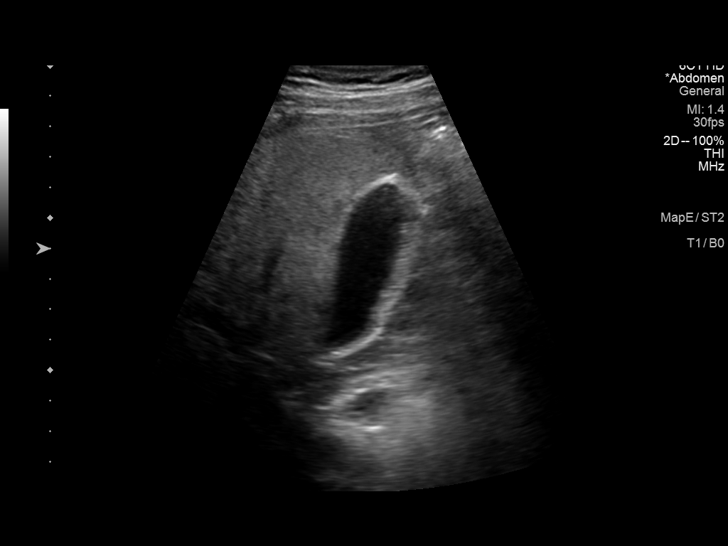
[im 28/68]
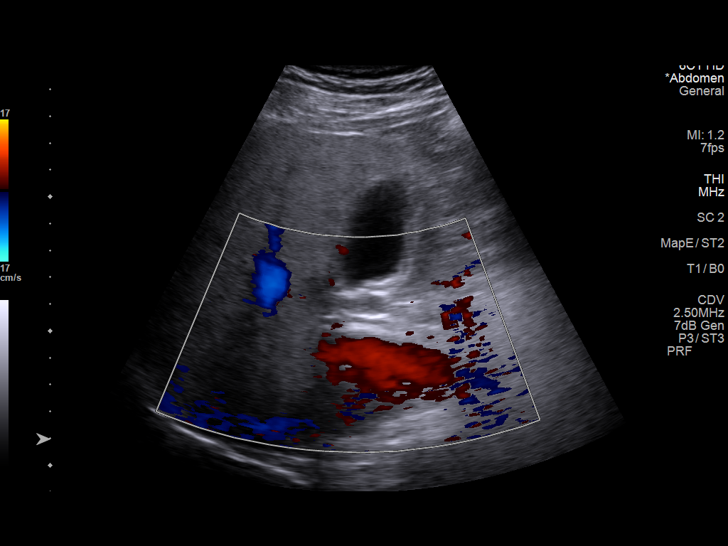
[im 34/68]
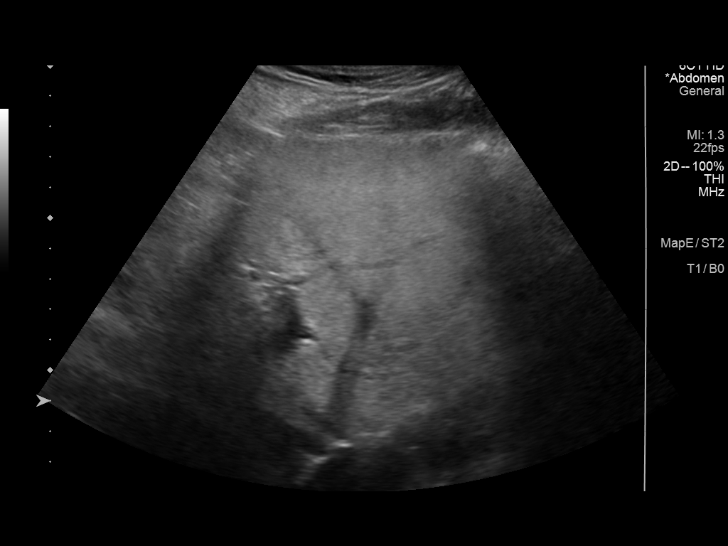
[im 40/68]
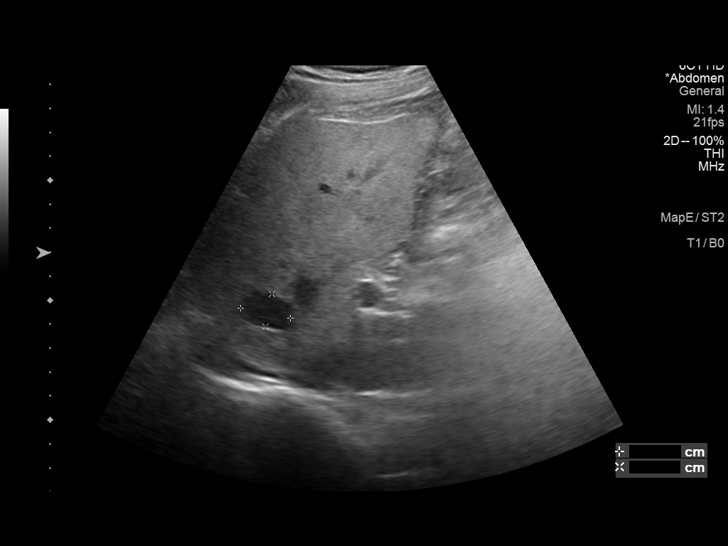
[im 45/68]
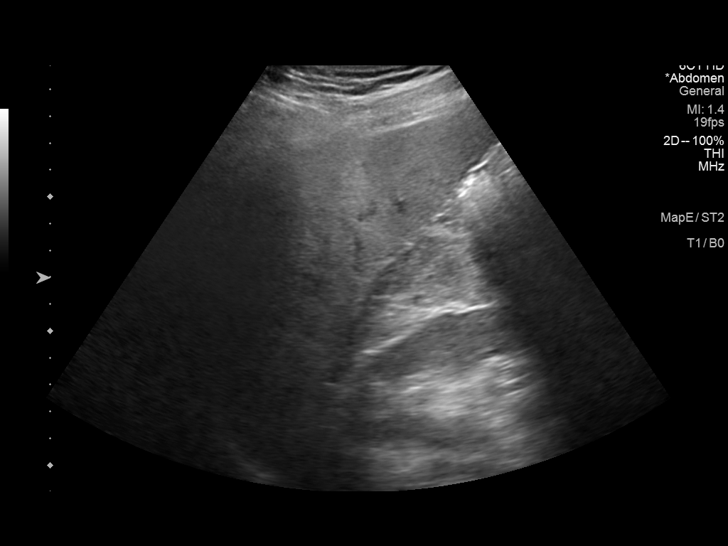
[im 51/68]
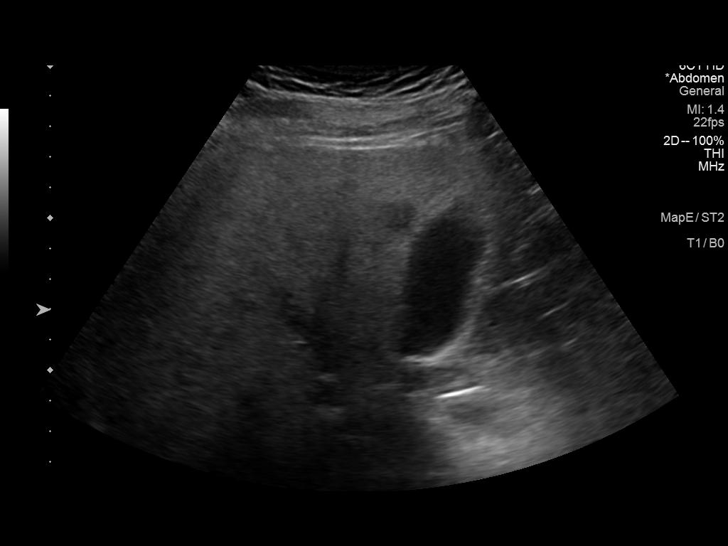
[im 56/68]
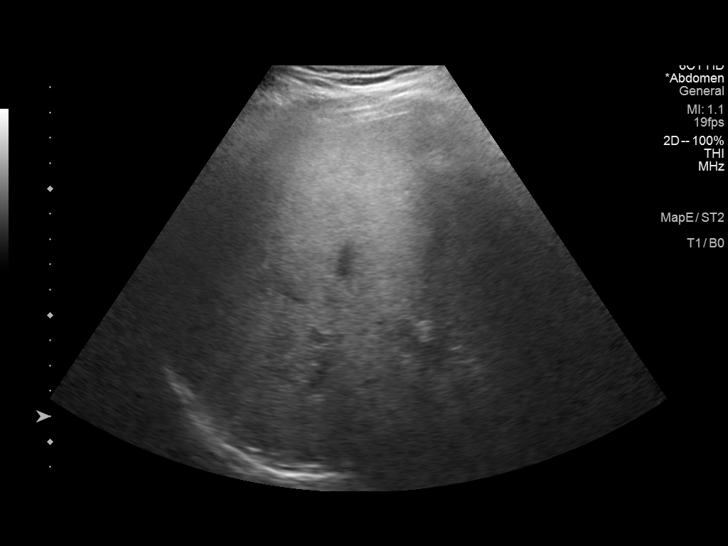
[im 62/68]
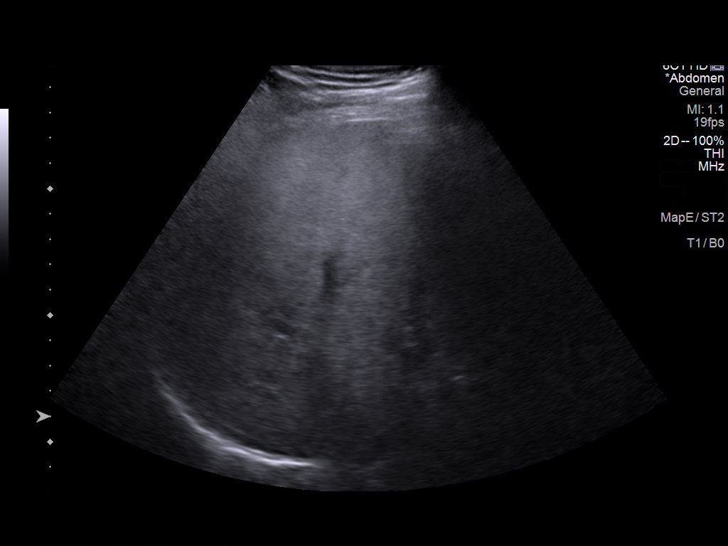
[im 68/68]
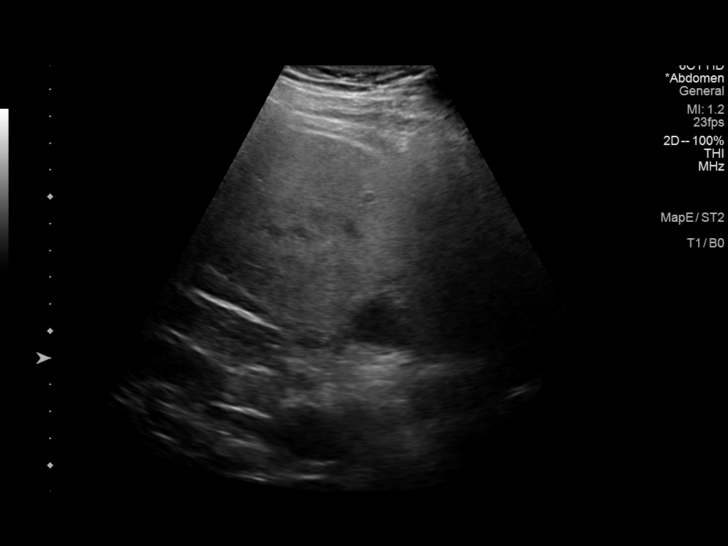

[13 of 25 positions shown; findings below may reference images not displayed]

FINDINGS: Gallbladder:

Slightly less well-defined 1.6 cm area of nonshadowing
hyperechogenicity along the dependent portion of gallbladder wall
that appears to move slowly with changes in patient positioning,
suggestive of tumefactive sludge. No gallstones or wall thickening
visualized. No sonographic Murphy sign noted by sonographer.

Common bile duct:

Diameter: 2 mm, normal.

Liver:

Unchanged 2.1 cm simple cyst in the hepatic dome. Unchanged
diffusely increased parenchymal echogenicity with areas of sparing.
Portal vein is patent on color Doppler imaging with normal direction
of blood flow towards the liver.

Other: None.
IMPRESSION: 1. Slightly less well-defined 1.6 cm area of nonshadowing
hyperechogenicity along the dependent portion of the gallbladder
wall. This appears to move slowly with changes in patient
positioning, more suggestive of tumefactive sludge than polyp.
Follow-up ultrasound in 6-9 months is recommended to evaluate for
interval change.
2. Unchanged hepatic steatosis.

## 2022-04-09 ENCOUNTER — Encounter: Payer: Self-pay | Admitting: Cardiology

## 2022-04-09 ENCOUNTER — Ambulatory Visit: Payer: No Typology Code available for payment source | Attending: Cardiology | Admitting: Cardiology

## 2022-04-09 VITALS — BP 160/100 | HR 77 | Ht 68.0 in | Wt 230.0 lb

## 2022-04-09 DIAGNOSIS — E782 Mixed hyperlipidemia: Secondary | ICD-10-CM | POA: Diagnosis not present

## 2022-04-09 DIAGNOSIS — Z8249 Family history of ischemic heart disease and other diseases of the circulatory system: Secondary | ICD-10-CM

## 2022-04-09 DIAGNOSIS — I2510 Atherosclerotic heart disease of native coronary artery without angina pectoris: Secondary | ICD-10-CM | POA: Diagnosis not present

## 2022-04-09 MED ORDER — ROSUVASTATIN CALCIUM 40 MG PO TABS: 40.0000 mg | ORAL_TABLET | Freq: Every day | ORAL | 3 refills | Status: DC

## 2022-04-09 NOTE — Progress Notes (Signed)
Cardiology Office Note:    Date:  04/09/2022   ID:  Casey Miles, DOB 02-23-1966, MRN [id number]  PCP:  Mosie Lukes, MD  Cardiologist:  Candee Furbish, MD  Electrophysiologist:  None   Referring MD: Mosie Lukes, MD   History of Present Illness:    Casey Miles is a 57 y.o. male here for the follow-up of hypertension and hyperlipidemia, mild coronary artery disease seen on coronary CT scan 2021, strong family history of early CAD.  Cardiac catheterization 2012 showed no CAD. Echo 2015-normal pump function  Prior appointment: Heart rate increased to 120 with mild effort. Dropped to 148 at one point for 20 minutes then stopped. Seem to be within normal limits . Father had a massive heart attack in his early 12s.  Prior EKG showed sinus rhythm 66  Has gained weight. 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   Has CP off and on. Left localized and across back. Spinal issues, could be nerves. Tingling, left arm, axilla.  Finally with the neurosurgeon he showed that he had herniated cervical spinal discs that could be causing some of his discomfort.  Occasionally will have bouts of atypical type chest discomfort  Since he has started rosuvastatin his cholesterol has been managed.  He states he is compliant 98% of the time.  Blood pressure at recent other visits for in normal range.  He denies any palpitations, or shortness of breath. No lightheadedness, headaches, syncope, orthopnea, PND, lower extremity edema or exertional symptoms.  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     Allergies:   Patient has no known allergies.   Social History   Socioeconomic History   Marital status: Married    Spouse name: Not on file   Number of children: Not on file   Years of education: Not on file   Highest education level: Not on file  Occupational History   Not on file  Tobacco Use   Smoking status: Never   Smokeless tobacco: Never  Vaping Use   Vaping Use: Never used  Substance and Sexual Activity   Alcohol use: Yes    Alcohol/week: 1.0 - 2.0 standard drink of alcohol    Types: 1 - 2 Standard drinks or equivalent  per week    Comment: 1-2 per week   Drug use: No   Sexual activity: Yes    Comment: lives wife and children, exercises intermittently, heart healthy diet.  Other Topics Concern   Not on file  Social History Narrative   Not on file   Social Determinants of Health   Financial Resource Strain: Not on file  Food Insecurity: Not on file  Transportation Needs: Not on file  Physical Activity: Not on file  Stress: Not on file  Social Connections: Not on file     Family History: The patient's family history includes Cancer in an other family member; Colitis in his maternal grandfather and maternal grandmother; Colon  polyps in an other family member; Diabetes in his maternal grandmother, mother, paternal grandmother, and another family member; Heart attack in an other family member; Heart attack (age of onset: 33) in his father; Heart disease in his maternal grandfather and maternal grandmother; Hyperlipidemia in his mother; Hypertension in his mother and another family member; Irritable bowel syndrome in some other family members; Lupus in his cousin; Stomach cancer in an other family member. There is no history of Colon cancer, Esophageal cancer, or Rectal cancer.  ROS:   Please see the history of present illness.  All other systems are reviewed and negative.   EKGs/Labs/Other Studies Reviewed:    The following studies were reviewed today: As above  Cardiac CTA with Calcium Score 07/25/2019: IMPRESSION: 1. Coronary calcium score of 31. This was 33 percentile for age and sex matched control.   2. Normal coronary origin with LEFT dominance. (RCA is small and originates high from non coronary cusp).   3. Mild calcified focal plaque of LAD that is non flow limiting (0-24%).   4. CAD-RADS 1. Minimal non-obstructive CAD (0-24%). Consider non-atherosclerotic causes of chest pain. Consider preventive therapy and risk factor modification.  EKG:  EKG is personally reviewed and interpreted. 04/09/2022-sinus rhythm 77 small Q waves in 3. 01/17/2021: Sinus rhythm. Rate 68 bpm. No other changes. 06/30/2019: sinus rhythm 76 no other significant abnormalities  Recent Labs: No results found for requested labs within last 365 days.  Recent Lipid Panel    Component Value Date/Time   CHOL 126 04/05/2020 0915   CHOL 150 11/15/2019 0816   TRIG 107.0 04/05/2020 0915   HDL 41.60 04/05/2020 0915   HDL 41 11/15/2019 0816   CHOLHDL 3 04/05/2020 0915   VLDL 21.4 04/05/2020 0915   LDLCALC 63 04/05/2020 0915   LDLCALC 78 11/15/2019 0816   LDLDIRECT 88.0 01/06/2019 0941    Physical Exam:    VS:  BP (!) 160/100    Pulse 77   Ht '5\' 8"'$  (1.727 m)   Wt 230 lb (104.3 kg)   SpO2 98%   BMI 34.97 kg/m     Wt Readings from Last 3 Encounters:  04/09/22 230 lb (104.3 kg)  01/17/21 225 lb (102.1 kg)  04/05/20 222 lb 9.6 oz (101 kg)     GEN:  Well nourished, well developed in no acute distress HEENT: Normal NECK: No JVD; No carotid bruits LYMPHATICS: No lymphadenopathy CARDIAC: RRR, no murmurs, rubs, gallops RESPIRATORY:  Clear to auscultation without rales, wheezing or rhonchi  ABDOMEN: Soft, non-tender, non-distended MUSCULOSKELETAL:  No edema; No deformity  SKIN: Warm and dry NEUROLOGIC:  Alert and oriented x 3 PSYCHIATRIC:  Normal affect   ASSESSMENT:    1. Coronary artery disease involving native coronary artery of native heart without angina pectoris   2.  Mixed hyperlipidemia   3. Family history of early CAD      PLAN:    In order of problems listed above:   Coronary artery disease involving native coronary artery of native heart without angina pectoris 2021 coronary calcium score of 31 with mild focal plaque of the LAD nonflow limiting.  Continue with goal-directed medical therapy.  LDL goal less than 70.  LDL 63. On rosuvastatin 40 mg a day.  Consider low-dose aspirin 81 mg.  Discussed again today.  Diet, exercise.  He spoke of his 66 year old father-in-law who for lunch would eat sardines crackers and [employer]Ho[employer].  Mixed hyperlipidemia Continue with Crestor 40 mg a day high intensity dose.  LDL goal less than 70.  63 LDL last checked.  Excellent.  No myalgias. Refill  Tachycardia Previously discussed that his increase in exercise was within standard limits.  Occasionally will notice heart rates in the mid 40s when relaxing on the couch.  Asymptomatic.  Not unusual.  Had a dizzy episode where his heart rate was in the 60s.  Benign.  Heart rate excellent today.  Family history of early CAD Father had a massive heart attack at age 63.  Continue high intensity statin currently.  LDL at  goal.  He has mild LAD stenosis.  Low-dose aspirin recommended.  Follow Up: 1 year would be reasonable  Medication Adjustments/Labs and Tests Ordered: Current medicines are reviewed at length with the patient today.  Concerns regarding medicines are outlined above.   Orders Placed This Encounter  Procedures   EKG 12-Lead    Meds ordered this encounter  Medications   rosuvastatin (CRESTOR) 40 MG tablet    Sig: Take 1 tablet (40 mg total) by mouth daily.    Dispense:  90 tablet    Refill:  3    Patient Instructions  Medication Instructions:  The current medical regimen is effective;  continue present plan and medications.  *If you need a refill on your cardiac medications before your next appointment, please call your pharmacy*  Follow-Up: At [facility], you and your health needs are our priority.  As part of our continuing mission to provide you with exceptional heart care, we have created designated Provider Care Teams.  These Care Teams include your primary Cardiologist (physician) and Advanced Practice Providers (APPs -  Physician Assistants and Nurse Practitioners) who all work together to provide you with the care you need, when you need it.  We recommend signing up for the patient portal called "MyChart".  Sign up information is provided on this After Visit Summary.  MyChart is used to connect with patients for Virtual Visits (Telemedicine).  Patients are able to view lab/test results, encounter notes, upcoming appointments, etc.  Non-urgent messages can be sent to your provider as well.   To learn more about what you can do with MyChart, go to [email or web address].    Your next appointment:   1 year(s)  Provider:   Candee Furbish, MD         Rondell Reams as a scribe for Candee Furbish, MD.,have documented all relevant documentation on the behalf of Candee Furbish, MD,as directed by  Candee Furbish, MD while in the presence of Candee Furbish, MD.   I, Candee Furbish, MD, have reviewed all documentation for this visit. The documentation on 04/09/22 for the exam, diagnosis, procedures, and orders are all accurate and complete.   Signed, Candee Furbish, MD  04/09/2022 9:27 AM    [facility]

## 2022-04-09 NOTE — Patient Instructions (Signed)
Medication Instructions:  The current medical regimen is effective;  continue present plan and medications.  *If you need a refill on your cardiac medications before your next appointment, please call your pharmacy*  Follow-Up: At [facility], you and your health needs are our priority.  As part of our continuing mission to provide you with exceptional heart care, we have created designated Provider Care Teams.  These Care Teams include your primary Cardiologist (physician) and Advanced Practice Providers (APPs -  Physician Assistants and Nurse Practitioners) who all work together to provide you with the care you need, when you need it.  We recommend signing up for the patient portal called "MyChart".  Sign up information is provided on this After Visit Summary.  MyChart is used to connect with patients for Virtual Visits (Telemedicine).  Patients are able to view lab/test results, encounter notes, upcoming appointments, etc.  Non-urgent messages can be sent to your provider as well.   To learn more about what you can do with MyChart, go to [email or web address].    Your next appointment:   1 year(s)  Provider:   Diamante Skains, MD      

## 2022-05-05 IMAGING — CT CT HEART MORP W/ CTA COR W/ SCORE W/ CA W/CM &/OR W/O CM
4 of 7 series · 8 of 20 positions shown, 9 images · non-contrast
Comparison: None.
COMPARISON: None.

Addendum:
EXAM:
OVER-READ INTERPRETATION  CT CHEST

The following report is an over-read performed by radiologist Dr.
Stamenov Incalza [[email or web address]] on 07/25/2019. This over-read
does not include interpretation of cardiac or coronary anatomy or
pathology. The coronary CTA interpretation by the cardiologist is
attached.
CLINICAL DATA: 53 year old with chest pain and family history of
early CAD
Cardiac/Coronary  CTA
TECHNIQUE: The patient was scanned on a [employer] Force scanner.

[Series 6: best diast 73 % · axial · 0.46mm/px · z∈[-137,-90]mm · 2 of 353 slices shown, 3 images]
[im 118/353  vessel]
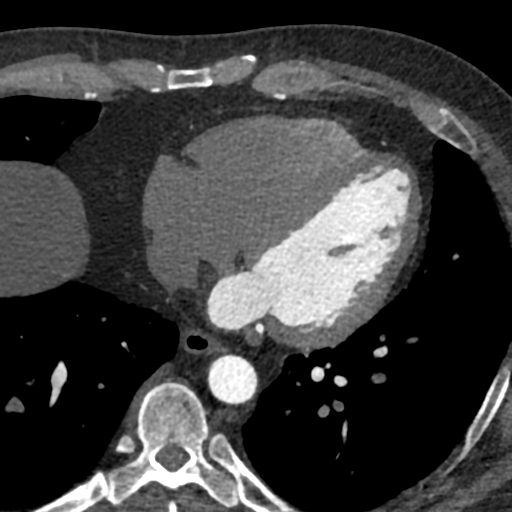
[im 118/353  lung]
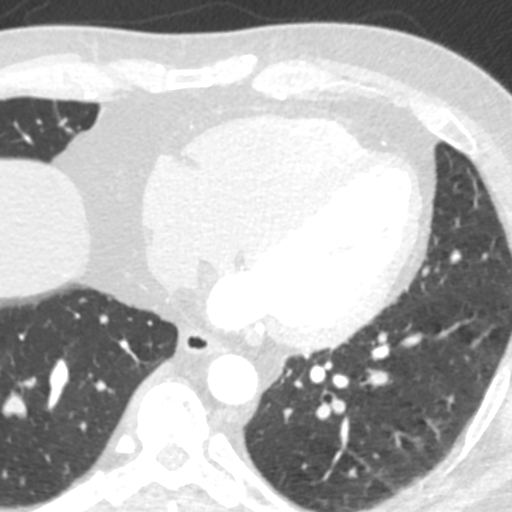
[im 235/353  vessel]
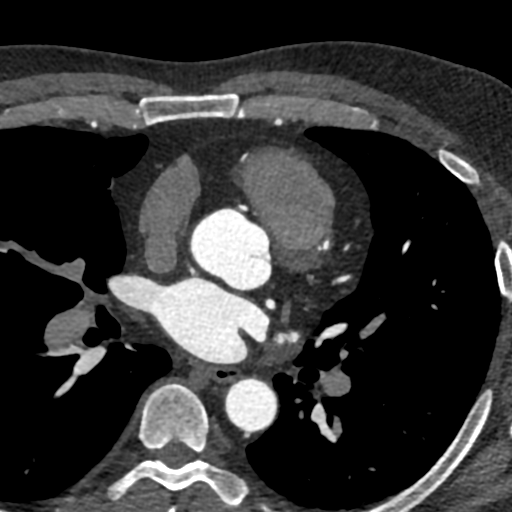

[Series 7: best syst 39 % · axial · 0.46mm/px · z∈[-137,-90]mm · 2 of 353 slices shown]
[im 118/353  vessel]
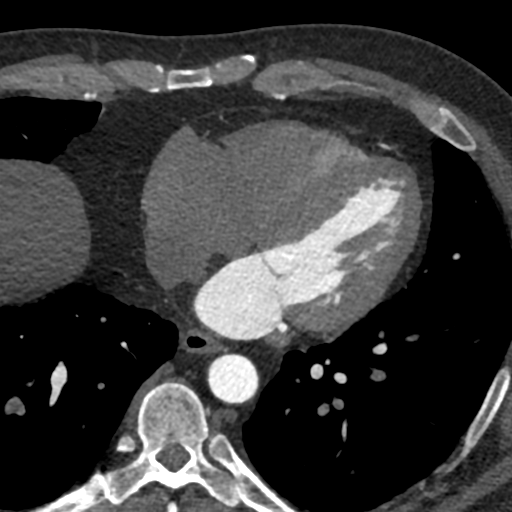
[im 235/353  vessel]
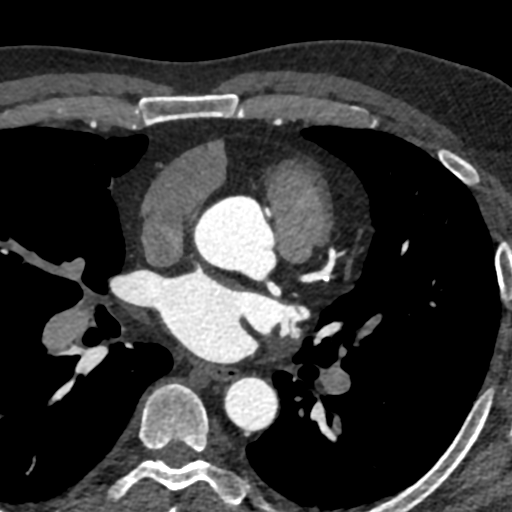

[Series 8: ts diast sharp 73 % · axial · 0.46mm/px · z∈[-137,-90]mm · 2 of 353 slices shown]
[im 118/353  lung]
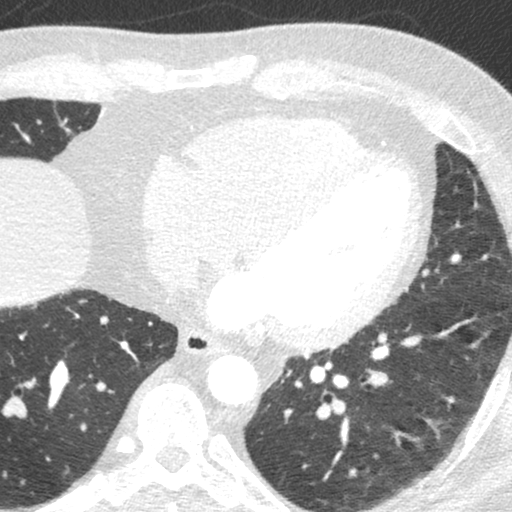
[im 235/353  lung]
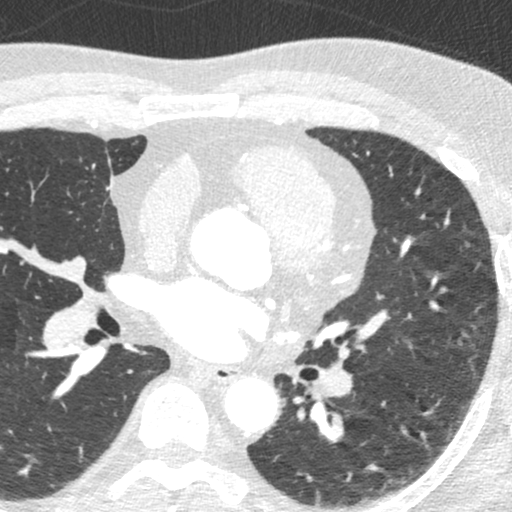

[Series 9: ts syst sharp 39 % · axial · 0.46mm/px · z∈[-137,-90]mm · 2 of 353 slices shown]
[im 118/353  lung]
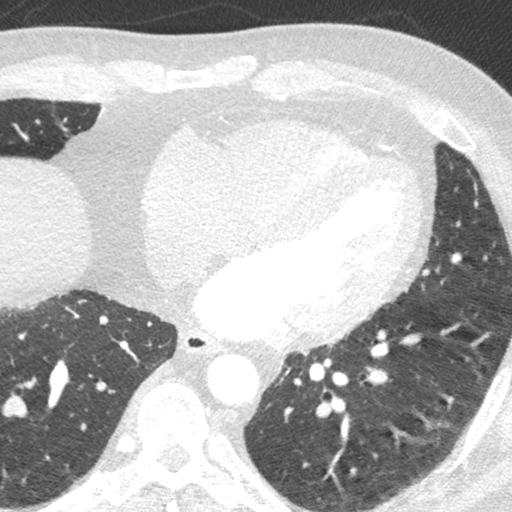
[im 235/353  lung]
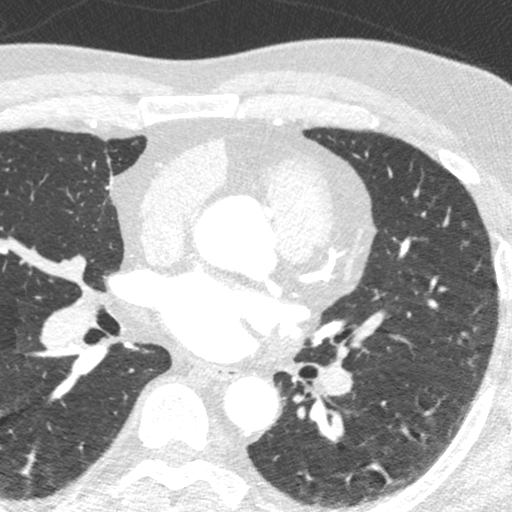

[8 of 20 positions shown; findings below may reference images not displayed]

FINDINGS: Vascular: Heart is normal size.  Aorta normal caliber.

Mediastinum/Nodes: No adenopathy

Lungs/Pleura: No confluent opacities or effusions.

Upper Abdomen: 2 cm low-density lesion in the hepatic dome is stable
since prior study compatible with cyst. No acute findings.

Musculoskeletal: Chest wall soft tissues are unremarkable. No acute
bony abnormality.
IMPRESSION: No acute or significant extracardiac abnormality.
FINDINGS: A 120 kV prospective scan was triggered in the descending thoracic
aorta at 111 HU's. Axial non-contrast 3 mm slices were carried out
through the heart. The data set was analyzed on a dedicated work
station and scored using the Agatson method. Gantry rotation speed
was 250 msecs and collimation was.6 mm. Beta blockade and 0.8 mg of
sl NTG was given. The 3D data set was reconstructed in 5% intervals
of the 67-82 % of the R-R cycle. Diastolic phases were analyzed on a
dedicated work station using MPR, MIP and VRT modes. The patient
received 80 cc of contrast.

Aorta:  Normal size.  No calcifications.  No dissection.

Aortic Valve:  Trileaflet.  No calcifications.

Coronary Arteries:  Normal coronary origin.  Right dominance.

RCA is a small non-dominant artery. Takeoff is high and left of
anterior from non coronary cusp. There is no plaque. [place] Allel artery
noted.

Left main is a large short artery that gives rise to LAD and LCX
arteries.

LAD is a large vessel that has calcified focal plaque at the ostium
of first diagonal that is non flow limiting (0-24%).

LCX is a dominant artery that gives rise to one large OM1 branch as
well as PDA and PLA. There is no plaque.

Other findings:

Normal pulmonary vein drainage into the left atrium (accessory right
middle pulmonary vein.).

Normal left atrial appendage without a thrombus.

Normal size of the pulmonary artery.

Please see radiology report for non cardiac findings.
IMPRESSION: 1. Coronary calcium score of 31. This was 75 percentile for age and
sex matched control.

2. Normal coronary origin with LEFT dominance. (RCA is small and
originates high from non coronary cusp).

3. Mild calcified focal plaque of LAD that is non flow limiting
(0-24%).

4. CAD-RADS 1. Minimal non-obstructive CAD (0-24%). Consider
non-atherosclerotic causes of chest pain. Consider preventive
therapy and risk factor modification.

*** End of Addendum ***
EXAM:
OVER-READ INTERPRETATION  CT CHEST

The following report is an over-read performed by radiologist Dr.
Stamenov Incalza [REDACTED] on 07/25/2019. This over-read
does not include interpretation of cardiac or coronary anatomy or
pathology. The coronary CTA interpretation by the cardiologist is
attached.
FINDINGS: Vascular: Heart is normal size.  Aorta normal caliber.

Mediastinum/Nodes: No adenopathy

Lungs/Pleura: No confluent opacities or effusions.

Upper Abdomen: 2 cm low-density lesion in the hepatic dome is stable
since prior study compatible with cyst. No acute findings.

Musculoskeletal: Chest wall soft tissues are unremarkable. No acute
bony abnormality.
IMPRESSION: No acute or significant extracardiac abnormality.

## 2022-05-14 ENCOUNTER — Encounter: Payer: Self-pay | Admitting: Gastroenterology

## 2022-06-16 ENCOUNTER — Encounter: Payer: Self-pay | Admitting: Gastroenterology

## 2022-06-16 ENCOUNTER — Ambulatory Visit (AMBULATORY_SURGERY_CENTER): Payer: No Typology Code available for payment source

## 2022-06-16 VITALS — Ht 68.0 in | Wt 230.0 lb

## 2022-06-16 DIAGNOSIS — Z86010 Personal history of colonic polyps: Secondary | ICD-10-CM

## 2022-06-16 MED ORDER — NA SULFATE-K SULFATE-MG SULF 17.5-3.13-1.6 GM/177ML PO SOLN: 1.0000 | Freq: Once | ORAL | 0 refills | Status: AC

## 2022-06-16 NOTE — Progress Notes (Signed)
No egg or soy allergy known to patient  No issues known to pt with past sedation with any surgeries or procedures Patient denies ever being told they had issues or difficulty with intubation  No FH of Malignant Hyperthermia Pt is not on diet pills Pt is not on  home 02  Pt is not on blood thinners  Pt denies issues with constipation  No A fib or A flutter Have any cardiac testing pending--no Pt instructed to use [email or web address] or GoodRx for a price reduction on prep   

## 2022-07-06 ENCOUNTER — Telehealth: Payer: Self-pay | Admitting: Physician Assistant

## 2022-07-06 NOTE — Telephone Encounter (Signed)
Patient called this morning, he is scheduled for a colonoscopy with Dr. Chales Abrahams tomorrow 07/07/2022.  He has had a herpes outbreak and typically gets some lesions above the anus, he does not have any internal rectal pain.  He has been on valacyclovir the past couple days and symptoms are improving .He just wanted to check prior to starting the bowel prep.  Pt was advised he should be fine to proceed with the colonoscopy tomorrow.

## 2022-07-07 ENCOUNTER — Encounter: Payer: Self-pay | Admitting: Gastroenterology

## 2022-07-07 ENCOUNTER — Ambulatory Visit (AMBULATORY_SURGERY_CENTER): Payer: No Typology Code available for payment source | Admitting: Gastroenterology

## 2022-07-07 VITALS — BP 120/85 | HR 68 | Temp 98.4°F | Resp 9 | Ht 68.0 in | Wt 230.0 lb

## 2022-07-07 DIAGNOSIS — K635 Polyp of colon: Secondary | ICD-10-CM | POA: Diagnosis not present

## 2022-07-07 DIAGNOSIS — Z86010 Personal history of colonic polyps: Secondary | ICD-10-CM

## 2022-07-07 DIAGNOSIS — D125 Benign neoplasm of sigmoid colon: Secondary | ICD-10-CM

## 2022-07-07 DIAGNOSIS — D122 Benign neoplasm of ascending colon: Secondary | ICD-10-CM | POA: Diagnosis not present

## 2022-07-07 DIAGNOSIS — Z09 Encounter for follow-up examination after completed treatment for conditions other than malignant neoplasm: Secondary | ICD-10-CM | POA: Diagnosis present

## 2022-07-07 MED ORDER — SODIUM CHLORIDE 0.9 % IV SOLN: 500.0000 mL | Freq: Once | INTRAVENOUS | Status: DC

## 2022-07-07 NOTE — Progress Notes (Signed)
To pacu, VSS. Report to [email or web address] 

## 2022-07-07 NOTE — Progress Notes (Signed)
[facility] History and Physical   Primary Care Physician:  Bradd Canary, MD   Reason for Procedure:   H/O Polyps  Plan:     colonoscopy     HPI: Casey Miles is a 57 y.o. 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   Elevated BP 10/08/2011   Elevated LFTs 06/19/2016   Fatigue 01/08/2011   GERD (gastroesophageal reflux disease)    Hyperglycemia 06/17/2016   Hyperlipidemia 10/08/2010   Kidney stone    NECK PAIN 09/26/2009   Neuromuscular disorder (HCC)    C4C5 herniated disc    Other acute reactions to stress 09/26/2009   Other and unspecified hyperlipidemia 01/23/2013   PARESTHESIA 11/22/2009   Preventative health care 08/01/2012   SHOULDER PAIN, LEFT 07/17/2008   Testosterone deficiency 07/09/2011   Vitamin D deficiency 07/08/2011    Past Surgical History:  Procedure Laterality Date   CARDIAC CATHETERIZATION     2013-2014   UMBILICAL HERNIA REPAIR  2017   UPPER GASTROINTESTINAL ENDOSCOPY      Prior to Admission medications   Medication Sig Start Date End Date Taking? Authorizing Provider  rosuvastatin (CRESTOR) 40 MG tablet Take 1 tablet (40 mg total) by mouth daily. 04/09/22  Yes Jake Bathe, MD    Current Outpatient Medications  Medication Sig Dispense Refill   rosuvastatin (CRESTOR) 40 MG tablet Take 1 tablet (40 mg total) by mouth daily. 90 tablet 3   Current Facility-Administered Medications  Medication Dose Route Frequency Provider Last Rate Last Admin   0.9 %  sodium chloride infusion  500 mL Intravenous Once Lynann Bologna, MD        Allergies as of 07/07/2022   (No Known Allergies)    Family History  Problem Relation Age of Onset   Diabetes Mother     Hypertension Mother    Hyperlipidemia Mother    Diabetes Maternal Grandmother    Heart disease Maternal Grandmother    Colitis Maternal Grandmother    Diabetes Paternal Grandmother    Colitis Maternal Grandfather    Heart disease Maternal Grandfather    Heart attack Father 46   Lupus Cousin    Irritable bowel syndrome Other    Cancer Other        Esophageal- Great Grandfather   Stomach cancer Other        Uncle   Colon polyps Other        Grandmother   Diabetes Other        Family history of   Hypertension Other        Family history of   Heart attack Other        Family history of   Irritable bowel syndrome Other        Family history of   Colon cancer Neg Hx    Esophageal cancer Neg Hx    Rectal cancer Neg Hx     Social History   Socioeconomic History   Marital status: Married    Spouse name: Not on file   Number of children: Not on file   Years of education: Not on file   Highest education level: Not on  file  Occupational History   Not on file  Tobacco Use   Smoking status: Never   Smokeless tobacco: Never  Vaping Use   Vaping Use: Never used  Substance and Sexual Activity   Alcohol use: Yes    Alcohol/week: 1.0 - 2.0 standard drink of alcohol    Types: 1 - 2 Standard drinks or equivalent per week    Comment: 1-2 per week   Drug use: No   Sexual activity: Yes    Comment: lives wife and children, exercises intermittently, heart healthy diet.  Other Topics Concern   Not on file  Social History Narrative   Not on file   Social Determinants of Health   Financial Resource Strain: Not on file  Food Insecurity: Not on file  Transportation Needs: Not on file  Physical Activity: Not on file  Stress: Not on file  Social Connections: Not on file  Intimate Partner Violence: Not on file    Review of Systems: Positive for none All other review of systems negative except as mentioned in the HPI.  Physical Exam: Vital signs in last 24  hours: @VSRANGES @   General:   Alert,  Well-developed, well-nourished, pleasant and cooperative in NAD Lungs:  Clear throughout to auscultation.   Heart:  Regular rate and rhythm; no murmurs, clicks, rubs,  or gallops. Abdomen:  Soft, nontender and nondistended. Normal bowel sounds.   Neuro/Psych:  Alert and cooperative. Normal mood and affect. A and O x 3    No significant changes were identified.  The patient continues to be an appropriate candidate for the planned procedure and anesthesia.   Edman Circle, MD. [facility] 07/07/2022 2:43 PM@

## 2022-07-07 NOTE — Op Note (Signed)
[facility] Patient Name: Casey Miles Procedure Date: 07/07/2022 2:45 PM MRN: [id number] Endoscopist: Lynann Bologna , MD, [id number] Age: 57 Referring MD:  Date of Birth: [birth year] Gender: Male Account #: [id number] Procedure:                Colonoscopy Indications:              High risk colon cancer surveillance: Personal                            history of colonic polyps Medicines:                Monitored Anesthesia Care Procedure:                Pre-Anesthesia Assessment:                           - Prior to the procedure, a History and Physical                            was performed, and patient medications and                            allergies were reviewed. The patient's tolerance of                            previous anesthesia was also reviewed. The risks                            and benefits of the procedure and the sedation                            options and risks were discussed with the patient.                            All questions were answered, and informed consent                            was obtained. Prior Anticoagulants: The patient has                            taken no anticoagulant or antiplatelet agents. ASA                            Grade Assessment: II - A patient with mild systemic                            disease. After reviewing the risks and benefits,                            the patient was deemed in satisfactory condition to                            undergo the procedure.  After obtaining informed consent, the colonoscope                            was passed under direct vision. Throughout the                            procedure, the patient's blood pressure, pulse, and                            oxygen saturations were monitored continuously. The                            CF [id number] #[id number] was introduced through the anus                            and advanced to the the cecum, identified  by                            appendiceal orifice and ileocecal valve. The                            colonoscopy was performed without difficulty. The                            patient tolerated the procedure well. The quality                            of the bowel preparation was good. The ileocecal                            valve, appendiceal orifice, and rectum were                            photographed. Scope In: 2:50:54 PM Scope Out: 3:08:44 PM Scope Withdrawal Time: 0 hours 15 minutes 20 seconds  Total Procedure Duration: 0 hours 17 minutes 50 seconds  Findings:                 A 4 mm polyp was found in the proximal ascending                            colon. The polyp was sessile. The polyp was removed                            with a cold snare. Resection and retrieval were                            complete.                           An 8 mm polyp was found in the proximal sigmoid                            colon. The polyp was sessile. The polyp was removed  with a hot snare. Resection and retrieval were                            complete.                           A few small-mouthed diverticula were found in the                            sigmoid colon.                           Non-bleeding internal hemorrhoids were found during                            retroflexion. The hemorrhoids were small and Grade                            I (internal hemorrhoids that do not prolapse). Complications:            No immediate complications. Estimated Blood Loss:     Estimated blood loss: none. Impression:               - One 4 mm polyp in the proximal ascending colon,                            removed with a cold snare. Resected and retrieved.                           - One 8 mm polyp in the proximal sigmoid colon,                            removed with a hot snare. Resected and retrieved.                           - Mild sigmoid diverticulosis                            - Non-bleeding internal hemorrhoids. Recommendation:           - Patient has a contact number available for                            emergencies. The signs and symptoms of potential                            delayed complications were discussed with the                            patient. Return to normal activities tomorrow.                            Written discharge instructions were provided to the                            patient.                           -  Resume previous diet.                           - Continue present medications.                           - Await pathology results.                           - Repeat colonoscopy for surveillance based on                            pathology results.                           - Return to GI clinic PRN.                           - The findings and recommendations were discussed                            with the patient's family. Lynann Bologna, MD 07/07/2022 3:15:41 PM This report has been signed electronically.

## 2022-07-07 NOTE — Patient Instructions (Signed)
   Handouts on polyps,diverticulosis,& hemorrhoids given to you today  Await pathology results on polyps removed      YOU HAD AN ENDOSCOPIC PROCEDURE TODAY AT THE  ENDOSCOPY CENTER:   Refer to the procedure report that was given to you for any specific questions about what was found during the examination.  If the procedure report does not answer your questions, please call your gastroenterologist to clarify.  If you requested that your care partner not be given the details of your procedure findings, then the procedure report has been included in a sealed envelope for you to review at your convenience later.  YOU SHOULD EXPECT: Some feelings of bloating in the abdomen. Passage of more gas than usual.  Walking can help get rid of the air that was put into your GI tract during the procedure and reduce the bloating. If you had a lower endoscopy (such as a colonoscopy or flexible sigmoidoscopy) you may notice spotting of blood in your stool or on the toilet paper. If you underwent a bowel prep for your procedure, you may not have a normal bowel movement for a few days.  Please Note:  You might notice some irritation and congestion in your nose or some drainage.  This is from the oxygen used during your procedure.  There is no need for concern and it should clear up in a day or so.  SYMPTOMS TO REPORT IMMEDIATELY:  Following lower endoscopy (colonoscopy or flexible sigmoidoscopy):  Excessive amounts of blood in the stool  Significant tenderness or worsening of abdominal pains  Swelling of the abdomen that is new, acute  Fever of 100F or higher   For urgent or emergent issues, a gastroenterologist can be reached at any hour by calling [phone]. Do not use MyChart messaging for urgent concerns.    DIET:  We do recommend a small meal at first, but then you may proceed to your regular diet.  Drink plenty of fluids but you should avoid alcoholic beverages for 24 hours.  ACTIVITY:   You should plan to take it easy for the rest of today and you should NOT DRIVE or use heavy machinery until tomorrow (because of the sedation medicines used during the test).    FOLLOW UP: Our staff will call the number listed on your records the next business day following your procedure.  We will call around 7:15- 8:00 am to check on you and address any questions or concerns that you may have regarding the information given to you following your procedure. If we do not reach you, we will leave a message.     If any biopsies were taken you will be contacted by phone or by letter within the next 1-3 weeks.  Please call us at [phone] if you have not heard about the biopsies in 3 weeks.    SIGNATURES/CONFIDENTIALITY: You and/or your care partner have signed paperwork which will be entered into your electronic medical record.  These signatures attest to the fact that that the information above on your After Visit Summary has been reviewed and is understood.  Full responsibility of the confidentiality of this discharge information lies with you and/or your care-partner.  

## 2022-07-07 NOTE — Progress Notes (Signed)
Called to room to assist during endoscopic procedure.  Patient ID and intended procedure confirmed with present staff. Received instructions for my participation in the procedure from the performing physician.  

## 2022-07-07 NOTE — Progress Notes (Signed)
Pt's states no medical or surgical changes since previsit or office visit. 

## 2022-07-08 ENCOUNTER — Telehealth: Payer: Self-pay | Admitting: *Deleted

## 2022-07-08 NOTE — Telephone Encounter (Signed)
Post procedure follow up call placed, no answer and left VM.  

## 2022-07-21 ENCOUNTER — Emergency Department (HOSPITAL_COMMUNITY): Payer: No Typology Code available for payment source

## 2022-07-21 ENCOUNTER — Encounter (HOSPITAL_COMMUNITY): Payer: Self-pay | Admitting: Emergency Medicine

## 2022-07-21 ENCOUNTER — Other Ambulatory Visit: Payer: Self-pay

## 2022-07-21 ENCOUNTER — Inpatient Hospital Stay (HOSPITAL_COMMUNITY)
Admission: EM | Admit: 2022-07-21 | Discharge: 2022-07-24 | DRG: 418 | Disposition: A | Discharge status: Home / Self Care | Payer: No Typology Code available for payment source | Attending: Internal Medicine | Admitting: Internal Medicine

## 2022-07-21 DIAGNOSIS — E669 Obesity, unspecified: Secondary | ICD-10-CM | POA: Diagnosis present

## 2022-07-21 DIAGNOSIS — K851 Biliary acute pancreatitis without necrosis or infection: Secondary | ICD-10-CM | POA: Diagnosis not present

## 2022-07-21 DIAGNOSIS — M549 Dorsalgia, unspecified: Secondary | ICD-10-CM

## 2022-07-21 DIAGNOSIS — Z6834 Body mass index (BMI) 34.0-34.9, adult: Secondary | ICD-10-CM

## 2022-07-21 DIAGNOSIS — E782 Mixed hyperlipidemia: Secondary | ICD-10-CM | POA: Diagnosis present

## 2022-07-21 DIAGNOSIS — E66812 Obesity, class 2: Secondary | ICD-10-CM

## 2022-07-21 DIAGNOSIS — Z87442 Personal history of urinary calculi: Secondary | ICD-10-CM

## 2022-07-21 DIAGNOSIS — F419 Anxiety disorder, unspecified: Secondary | ICD-10-CM | POA: Diagnosis present

## 2022-07-21 DIAGNOSIS — K828 Other specified diseases of gallbladder: Secondary | ICD-10-CM | POA: Diagnosis present

## 2022-07-21 DIAGNOSIS — K8020 Calculus of gallbladder without cholecystitis without obstruction: Secondary | ICD-10-CM | POA: Diagnosis present

## 2022-07-21 DIAGNOSIS — Z86010 Personal history of colonic polyps: Secondary | ICD-10-CM

## 2022-07-21 DIAGNOSIS — K7689 Other specified diseases of liver: Secondary | ICD-10-CM | POA: Diagnosis present

## 2022-07-21 DIAGNOSIS — Z833 Family history of diabetes mellitus: Secondary | ICD-10-CM

## 2022-07-21 DIAGNOSIS — Z1152 Encounter for screening for COVID-19: Secondary | ICD-10-CM

## 2022-07-21 DIAGNOSIS — K219 Gastro-esophageal reflux disease without esophagitis: Secondary | ICD-10-CM | POA: Diagnosis present

## 2022-07-21 DIAGNOSIS — Z83438 Family history of other disorder of lipoprotein metabolism and other lipidemia: Secondary | ICD-10-CM

## 2022-07-21 DIAGNOSIS — I2510 Atherosclerotic heart disease of native coronary artery without angina pectoris: Secondary | ICD-10-CM | POA: Diagnosis present

## 2022-07-21 DIAGNOSIS — R7989 Other specified abnormal findings of blood chemistry: Secondary | ICD-10-CM | POA: Diagnosis present

## 2022-07-21 DIAGNOSIS — R079 Chest pain, unspecified: Principal | ICD-10-CM

## 2022-07-21 DIAGNOSIS — I10 Essential (primary) hypertension: Secondary | ICD-10-CM | POA: Diagnosis present

## 2022-07-21 DIAGNOSIS — Z79899 Other long term (current) drug therapy: Secondary | ICD-10-CM

## 2022-07-21 DIAGNOSIS — R7401 Elevation of levels of liver transaminase levels: Secondary | ICD-10-CM | POA: Diagnosis present

## 2022-07-21 DIAGNOSIS — K8010 Calculus of gallbladder with chronic cholecystitis without obstruction: Secondary | ICD-10-CM | POA: Diagnosis present

## 2022-07-21 DIAGNOSIS — Z8249 Family history of ischemic heart disease and other diseases of the circulatory system: Secondary | ICD-10-CM

## 2022-07-21 DIAGNOSIS — K760 Fatty (change of) liver, not elsewhere classified: Secondary | ICD-10-CM | POA: Diagnosis present

## 2022-07-21 DIAGNOSIS — Z83719 Family history of colon polyps, unspecified: Secondary | ICD-10-CM

## 2022-07-21 DIAGNOSIS — R1013 Epigastric pain: Secondary | ICD-10-CM

## 2022-07-21 DIAGNOSIS — Z800 Family history of malignant neoplasm of digestive organs: Secondary | ICD-10-CM

## 2022-07-21 LAB — BASIC METABOLIC PANEL
Anion gap: 10 (ref 5–15)
BUN: 14 mg/dL (ref 6–20)
CO2: 25 mmol/L (ref 22–32)
Calcium: 8.9 mg/dL (ref 8.9–10.3)
Chloride: 101 mmol/L (ref 98–111)
Creatinine, Ser: 1.13 mg/dL (ref 0.61–1.24)
GFR, Estimated: >60 mL/min (ref >60)
Glucose, Bld: 176 mg/dL — ABNORMAL HIGH (ref 70–99)
Potassium: 4.4 mmol/L (ref 3.5–5.1)
Sodium: 136 mmol/L (ref 135–145)

## 2022-07-21 LAB — TROPONIN I (HIGH SENSITIVITY): Troponin I (High Sensitivity): 4 ng/L (ref <18)

## 2022-07-21 LAB — CBC
HCT: 41.8 % (ref 39.0–52.0)
Hemoglobin: 14.6 g/dL (ref 13.0–17.0)
MCH: 32 pg (ref 26.0–34.0)
MCHC: 34.9 g/dL (ref 30.0–36.0)
MCV: 91.7 fL (ref 80.0–100.0)
Platelets: 219 10*3/uL (ref 150–400)
RBC: 4.56 MIL/uL (ref 4.22–5.81)
RDW: 12.5 % (ref 11.5–15.5)
WBC: 10.7 10*3/uL — ABNORMAL HIGH (ref 4.0–10.5)
nRBC: 0 % (ref 0.0–0.2)

## 2022-07-21 NOTE — ED Triage Notes (Signed)
Pt in with tightening L chest pain that began at 8pm. Pt states LUQ cramping pain that began at 7:45pm, and pt took gas-x with no relief. At 8pm, pain moved up into his chest, and he went to fire dept - EKG and VS performed and pt was hypertensive 220/130 with NSR EKG. Pt was given 324mg  ASA. Denies n/v dizziness currently. Pain 2/10 at this time.

## 2022-07-22 ENCOUNTER — Emergency Department (HOSPITAL_COMMUNITY): Payer: No Typology Code available for payment source

## 2022-07-22 ENCOUNTER — Inpatient Hospital Stay (HOSPITAL_COMMUNITY): Payer: No Typology Code available for payment source

## 2022-07-22 DIAGNOSIS — Z800 Family history of malignant neoplasm of digestive organs: Secondary | ICD-10-CM | POA: Diagnosis not present

## 2022-07-22 DIAGNOSIS — K851 Biliary acute pancreatitis without necrosis or infection: Secondary | ICD-10-CM | POA: Diagnosis present

## 2022-07-22 DIAGNOSIS — Z83719 Family history of colon polyps, unspecified: Secondary | ICD-10-CM | POA: Diagnosis not present

## 2022-07-22 DIAGNOSIS — K8010 Calculus of gallbladder with chronic cholecystitis without obstruction: Secondary | ICD-10-CM | POA: Diagnosis present

## 2022-07-22 DIAGNOSIS — Z83438 Family history of other disorder of lipoprotein metabolism and other lipidemia: Secondary | ICD-10-CM | POA: Diagnosis not present

## 2022-07-22 DIAGNOSIS — Z79899 Other long term (current) drug therapy: Secondary | ICD-10-CM | POA: Diagnosis not present

## 2022-07-22 DIAGNOSIS — K760 Fatty (change of) liver, not elsewhere classified: Secondary | ICD-10-CM | POA: Diagnosis present

## 2022-07-22 DIAGNOSIS — Z86010 Personal history of colonic polyps: Secondary | ICD-10-CM | POA: Diagnosis not present

## 2022-07-22 DIAGNOSIS — I2510 Atherosclerotic heart disease of native coronary artery without angina pectoris: Secondary | ICD-10-CM | POA: Diagnosis present

## 2022-07-22 DIAGNOSIS — I10 Essential (primary) hypertension: Secondary | ICD-10-CM | POA: Diagnosis present

## 2022-07-22 DIAGNOSIS — K8020 Calculus of gallbladder without cholecystitis without obstruction: Secondary | ICD-10-CM | POA: Diagnosis present

## 2022-07-22 DIAGNOSIS — K828 Other specified diseases of gallbladder: Secondary | ICD-10-CM | POA: Diagnosis present

## 2022-07-22 DIAGNOSIS — R7989 Other specified abnormal findings of blood chemistry: Secondary | ICD-10-CM | POA: Diagnosis not present

## 2022-07-22 DIAGNOSIS — E669 Obesity, unspecified: Secondary | ICD-10-CM | POA: Diagnosis present

## 2022-07-22 DIAGNOSIS — K7689 Other specified diseases of liver: Secondary | ICD-10-CM | POA: Diagnosis present

## 2022-07-22 DIAGNOSIS — Z6834 Body mass index (BMI) 34.0-34.9, adult: Secondary | ICD-10-CM | POA: Diagnosis not present

## 2022-07-22 DIAGNOSIS — E782 Mixed hyperlipidemia: Secondary | ICD-10-CM | POA: Diagnosis present

## 2022-07-22 DIAGNOSIS — R7401 Elevation of levels of liver transaminase levels: Secondary | ICD-10-CM | POA: Diagnosis present

## 2022-07-22 DIAGNOSIS — K219 Gastro-esophageal reflux disease without esophagitis: Secondary | ICD-10-CM | POA: Diagnosis present

## 2022-07-22 DIAGNOSIS — Z8249 Family history of ischemic heart disease and other diseases of the circulatory system: Secondary | ICD-10-CM | POA: Diagnosis not present

## 2022-07-22 DIAGNOSIS — Z833 Family history of diabetes mellitus: Secondary | ICD-10-CM | POA: Diagnosis not present

## 2022-07-22 DIAGNOSIS — F419 Anxiety disorder, unspecified: Secondary | ICD-10-CM | POA: Diagnosis present

## 2022-07-22 DIAGNOSIS — Z87442 Personal history of urinary calculi: Secondary | ICD-10-CM | POA: Diagnosis not present

## 2022-07-22 DIAGNOSIS — Z1152 Encounter for screening for COVID-19: Secondary | ICD-10-CM | POA: Diagnosis not present

## 2022-07-22 LAB — D-DIMER, QUANTITATIVE: D-Dimer, Quant: 0.5 ug/mL-FEU (ref 0.00–0.50)

## 2022-07-22 LAB — HEPATIC FUNCTION PANEL
ALT: 98 U/L — ABNORMAL HIGH (ref 0–44)
AST: 97 U/L — ABNORMAL HIGH (ref 15–41)
Albumin: 3.8 g/dL (ref 3.5–5.0)
Alkaline Phosphatase: 67 U/L (ref 38–126)
Bilirubin, Direct: 0.2 mg/dL (ref 0.0–0.2)
Indirect Bilirubin: 0.8 mg/dL (ref 0.3–0.9)
Total Bilirubin: 1 mg/dL (ref 0.3–1.2)
Total Protein: 7 g/dL (ref 6.5–8.1)

## 2022-07-22 LAB — LIPASE, BLOOD: Lipase: 743 U/L — ABNORMAL HIGH (ref 11–51)

## 2022-07-22 LAB — LIPID PANEL
Cholesterol: 119 mg/dL (ref 0–200)
HDL: 32 mg/dL — ABNORMAL LOW (ref >40)
LDL Cholesterol: 57 mg/dL (ref 0–99)
Total CHOL/HDL Ratio: 3.7 RATIO
Triglycerides: 152 mg/dL — ABNORMAL HIGH (ref <150)
VLDL: 30 mg/dL (ref 0–40)

## 2022-07-22 LAB — HIV ANTIBODY (ROUTINE TESTING W REFLEX): HIV Screen 4th Generation wRfx: NONREACTIVE

## 2022-07-22 LAB — TROPONIN I (HIGH SENSITIVITY): Troponin I (High Sensitivity): 3 ng/L (ref <18)

## 2022-07-22 MED ORDER — GADOBUTROL 1 MMOL/ML IV SOLN
10.0000 mL | Freq: Once | INTRAVENOUS | Status: AC | PRN
  Administered 2022-07-22: 10 mL via INTRAVENOUS

## 2022-07-22 MED ORDER — LIDOCAINE VISCOUS HCL 2 % MT SOLN
15.0000 mL | Freq: Once | OROMUCOSAL | Status: AC
  Administered 2022-07-22: 15 mL via ORAL
  Filled 2022-07-22: qty 15

## 2022-07-22 MED ORDER — SENNOSIDES-DOCUSATE SODIUM 8.6-50 MG PO TABS: 1.0000 | ORAL_TABLET | Freq: Every evening | ORAL | Status: DC | PRN

## 2022-07-22 MED ORDER — ALUM & MAG HYDROXIDE-SIMETH 200-200-20 MG/5ML PO SUSP
30.0000 mL | Freq: Once | ORAL | Status: AC
  Administered 2022-07-22: 30 mL via ORAL
  Filled 2022-07-22: qty 30

## 2022-07-22 MED ORDER — IOHEXOL 350 MG/ML SOLN
75.0000 mL | Freq: Once | INTRAVENOUS | Status: AC | PRN
  Administered 2022-07-22: 75 mL via INTRAVENOUS

## 2022-07-22 MED ORDER — LACTATED RINGERS IV SOLN: INTRAVENOUS | Status: AC

## 2022-07-22 MED ORDER — HYDROMORPHONE HCL 1 MG/ML IJ SOLN: 0.5000 mg | INTRAMUSCULAR | Status: DC | PRN

## 2022-07-22 MED ORDER — SODIUM CHLORIDE 0.9 % IV BOLUS
1000.0000 mL | Freq: Once | INTRAVENOUS | Status: AC
  Administered 2022-07-22: 1000 mL via INTRAVENOUS

## 2022-07-22 MED ORDER — HYDRALAZINE HCL 20 MG/ML IJ SOLN: 5.0000 mg | INTRAMUSCULAR | Status: DC | PRN

## 2022-07-22 MED ORDER — ONDANSETRON HCL 4 MG PO TABS: 4.0000 mg | ORAL_TABLET | Freq: Four times a day (QID) | ORAL | Status: DC | PRN

## 2022-07-22 MED ORDER — LACTATED RINGERS IV SOLN: INTRAVENOUS | Status: DC

## 2022-07-22 MED ORDER — ACETAMINOPHEN 650 MG RE SUPP: 650.0000 mg | Freq: Four times a day (QID) | RECTAL | Status: DC | PRN

## 2022-07-22 MED ORDER — ENOXAPARIN SODIUM 60 MG/0.6ML IJ SOSY
50.0000 mg | PREFILLED_SYRINGE | INTRAMUSCULAR | Status: DC
  Administered 2022-07-22: 50 mg via SUBCUTANEOUS
  Filled 2022-07-22 (×3): qty 0.6

## 2022-07-22 MED ORDER — SODIUM CHLORIDE 0.9 % IV SOLN: INTRAVENOUS | Status: DC

## 2022-07-22 MED ORDER — ONDANSETRON HCL 4 MG/2ML IJ SOLN: 4.0000 mg | Freq: Four times a day (QID) | INTRAMUSCULAR | Status: DC | PRN

## 2022-07-22 MED ORDER — HYDROCODONE-ACETAMINOPHEN 5-325 MG PO TABS
1.0000 | ORAL_TABLET | ORAL | Status: DC | PRN
  Administered 2022-07-23: 2 via ORAL
  Administered 2022-07-24: 1 via ORAL
  Filled 2022-07-22: qty 2
  Filled 2022-07-22: qty 1

## 2022-07-22 MED ORDER — ACETAMINOPHEN 325 MG PO TABS: 650.0000 mg | ORAL_TABLET | Freq: Four times a day (QID) | ORAL | Status: DC | PRN

## 2022-07-22 NOTE — Plan of Care (Signed)
  Problem: Clinical Measurements: Goal: Will remain free from infection Outcome: Progressing S/Sx of infection monitored and assessed q-shift. Pt has remained afebrile thus far. Educated and informed pt on effective/ proper hand hygiene to prevent further spread if infection.    Problem: Activity: Goal: Risk for activity intolerance will decrease Outcome: Progressing Pt is mobile and can independently ambulate in his room and hallways.  He is able to perform ADLs independently.   Problem: Nutrition: Goal: Adequate nutrition will be maintained Outcome: Progressing Pt currently on clear liquid diet per MD's orders.   Problem: Elimination: Goal: Will not experience complications related to bowel motility Outcome: Progressing Pt stated his LBM was earlier this AM.  He denies bowel dysfunction.    Problem: Elimination: Goal: Will not experience complications related to urinary retention Outcome: Progressing Pt has denied urinary retention.    Problem: Pain Managment: Goal: General experience of comfort will improve Outcome: Progressing Pt has denied pain thus far.   Problem: Safety: Goal: Ability to remain free from injury will improve Outcome: Progressing Pt has remained free from falls thus far. Instructed pt to utilize RN call light for assistance. Hourly rounds performed. Bed in lowest position, locked with two upper side rails engaged. Belongings and call light within reach.

## 2022-07-22 NOTE — ED Notes (Signed)
ED TO INPATIENT HANDOFF REPORT  ED Nurse Name and Phone #: [phone], Crystal  S Name/Age/Gender Casey Miles 57 y.o. male Room/Bed: 017C/017C  Code Status   Code Status: Not on file  Home/SNF/Other Home Patient oriented to: self, place, time, and situation Is this baseline? Yes   Triage Complete: Triage complete  Chief Complaint Gallstone pancreatitis [K85.10]  Triage Note Pt in with tightening L chest pain that began at 8pm. Pt states LUQ cramping pain that began at 7:45pm, and pt took gas-x with no relief. At 8pm, pain moved up into his chest, and he went to fire dept - EKG and VS performed and pt was hypertensive 220/130 with NSR EKG. Pt was given 324mg  ASA. Denies n/v dizziness currently. Pain 2/10 at this time.    Allergies No Known Allergies  Level of Care/Admitting Diagnosis ED Disposition     ED Disposition  Admit   Condition  --   Comment  Hospital Area: [facility] [[id number]]  Level of Care: Med-Surg [16]  May admit patient to [facility] or [facility] if equivalent level of care is available:: No  Covid Evaluation: Asymptomatic - no recent exposure (last 10 days) testing not required  Diagnosis: Gallstone pancreatitis [322356]  Admitting Physician: Noralee Stain [[id number]]  Attending Physician: Noralee Stain [id number]  Certification:: I certify there are rare and unusual circumstances requiring inpatient admission  Estimated Length of Stay: 2          B Medical/Surgery History 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   Elevated BP 10/08/2011   Elevated LFTs 06/19/2016   Fatigue 01/08/2011   GERD (gastroesophageal reflux disease)    Hyperglycemia  06/17/2016   Hyperlipidemia 10/08/2010   Kidney stone    NECK PAIN 09/26/2009   Neuromuscular disorder (HCC)    C4C5 herniated disc    Other acute reactions to stress 09/26/2009   Other and unspecified hyperlipidemia 01/23/2013   PARESTHESIA 11/22/2009   Preventative health care 08/01/2012   SHOULDER PAIN, LEFT 07/17/2008   Testosterone deficiency 07/09/2011   Vitamin D deficiency 07/08/2011   Past Surgical History:  Procedure Laterality Date   CARDIAC CATHETERIZATION     2013-2014   UMBILICAL HERNIA REPAIR  2017   UPPER GASTROINTESTINAL ENDOSCOPY       A IV Location/Drains/Wounds Patient Lines/Drains/Airways Status     Active Line/Drains/Airways     Name Placement date Placement time Site Days   Peripheral IV 07/22/22 20 G Anterior;Distal;Right;Upper Arm 07/22/22  0606  Arm  less than 1            Intake/Output Last 24 hours No intake or output data in the 24 hours ending 07/22/22 7829  Labs/Imaging Results for orders placed or performed during the hospital encounter of 07/21/22 (from the past 48 hour(s))  Basic metabolic panel     Status: Abnormal   Collection Time: 07/21/22 10:40 PM  Result Value Ref Range   Sodium 136 135 - 145 mmol/L   Potassium 4.4 3.5 - 5.1 mmol/L    Comment: HEMOLYSIS AT THIS LEVEL MAY AFFECT RESULT   Chloride 101 98 - 111 mmol/L   CO2 25 22 - 32  mmol/L   Glucose, Bld 176 (H) 70 - 99 mg/dL    Comment: Glucose reference range applies only to samples taken after fasting for at least 8 hours.   BUN 14 6 - 20 mg/dL   Creatinine, Ser 1.61 0.61 - 1.24 mg/dL   Calcium 8.9 8.9 - 09.6 mg/dL   GFR, Estimated >04 >54 mL/min    Comment: (NOTE) Calculated using the CKD-EPI Creatinine Equation (2021)    Anion gap 10 5 - 15    Comment: Performed at [facility], [address], [place], [place] [address]  CBC     Status: Abnormal   Collection Time: 07/21/22 10:40 PM  Result Value Ref Range   WBC 10.7 (H) 4.0 - 10.5 K/uL   RBC 4.56 4.22 - 5.81 MIL/uL    Hemoglobin 14.6 13.0 - 17.0 g/dL   HCT 91.4 78.2 - 95.6 %   MCV 91.7 80.0 - 100.0 fL   MCH 32.0 26.0 - 34.0 pg   MCHC 34.9 30.0 - 36.0 g/dL   RDW 21.3 08.6 - 57.8 %   Platelets 219 150 - 400 K/uL   nRBC 0.0 0.0 - 0.2 %    Comment: Performed at [facility], [address] [place], [place] [address]  Troponin I (High Sensitivity)     Status: None   Collection Time: 07/21/22 10:40 PM  Result Value Ref Range   Troponin I (High Sensitivity) 4 <18 ng/L    Comment: (NOTE) Elevated high sensitivity troponin I (hsTnI) values and significant  changes across serial measurements may suggest ACS but many other  chronic and acute conditions are known to elevate hsTnI results.  Refer to the "Links" section for chest pain algorithms and additional  guidance. Performed at [facility], [address] [place], [place] [address]   Troponin I (High Sensitivity)     Status: None   Collection Time: 07/22/22  3:10 AM  Result Value Ref Range   Troponin I (High Sensitivity) 3 <18 ng/L    Comment: (NOTE) Elevated high sensitivity troponin I (hsTnI) values and significant  changes across serial measurements may suggest ACS but many other  chronic and acute conditions are known to elevate hsTnI results.  Refer to the "Links" section for chest pain algorithms and additional  guidance. Performed at [facility], [address], [place], [place] [address]   D-dimer, quantitative     Status: None   Collection Time: 07/22/22  6:05 AM  Result Value Ref Range   D-Dimer, Quant 0.50 0.00 - 0.50 ug/mL-FEU    Comment: (NOTE) At the manufacturer cut-off value of 0.5 g/mL FEU, this assay has a negative predictive value of 95-100%.This assay is intended for use in conjunction with a clinical pretest probability (PTP) assessment model to exclude pulmonary embolism (PE) and deep venous thrombosis (DVT) in outpatients suspected of PE or DVT. Results should be correlated with clinical  presentation. Performed at [facility], [address] [place], [place] [address]   Hepatic function panel     Status: Abnormal   Collection Time: 07/22/22  6:48 AM  Result Value Ref Range   Total Protein 7.0 6.5 - 8.1 g/dL   Albumin 3.8 3.5 - 5.0 g/dL   AST 97 (H) 15 - 41 U/L   ALT 98 (H) 0 - 44 U/L   Alkaline Phosphatase 67 38 - 126 U/L   Total Bilirubin 1.0 0.3 - 1.2 mg/dL   Bilirubin, Direct  0.2 0.0 - 0.2 mg/dL   Indirect Bilirubin 0.8 0.3 - 0.9 mg/dL    Comment: Performed at [facility], [address], [place], [place] [address]  Lipase, blood     Status: Abnormal   Collection Time: 07/22/22  6:48 AM  Result Value Ref Range   Lipase 743 (H) 11 - 51 U/L    Comment: RESULTS CONFIRMED BY MANUAL DILUTION Performed at [facility], [address] [place], [place] [address]    CT ABDOMEN PELVIS W CONTRAST  Result Date: 07/22/2022 CLINICAL DATA:  Acute onset left upper quadrant abdominal pain associated with hypertension EXAM: CT ABDOMEN AND PELVIS WITH CONTRAST TECHNIQUE: Multidetector CT imaging of the abdomen and pelvis was performed using the standard protocol following bolus administration of intravenous contrast. RADIATION DOSE REDUCTION: This exam was performed according to the departmental dose-optimization program which includes automated exposure control, adjustment of the mA and/or kV according to patient size and/or use of iterative reconstruction technique. CONTRAST:  75mL OMNIPAQUE IOHEXOL 350 MG/ML SOLN COMPARISON:  CT abdomen and pelvis dated 08/03/2017 FINDINGS: Lower chest: No focal consolidation or pulmonary nodule in the lung bases. No pleural effusion or pneumothorax demonstrated. Partially imaged heart size is normal. Coronary artery calcification. Hepatobiliary: Increase in size of 2.6 cm segment 4 cyst (3:16), previously 1.9 cm. No intra or extrahepatic biliary ductal dilation. Layering hyperattenuation, likely sludge versus stones. Pancreas: No  focal lesions or main ductal dilation. Heterogeneous hypoattenuation in the pancreatic head likely corresponds to uneven lipomatosis. Spleen: Normal in size without focal abnormality. Adrenals/Urinary Tract: No adrenal nodules. No suspicious renal mass, calculi or hydronephrosis. No focal bladder wall thickening. Stomach/Bowel: Normal appearance of the stomach. No evidence of bowel wall thickening, distention, or inflammatory changes. Normal appendix. Vascular/Lymphatic: Aortic atherosclerosis. Retroaortic left renal vein. No enlarged abdominal or pelvic lymph nodes. Reproductive: Prostate is unremarkable. Other: Trace fluid along the left anterior renal fascia. No free air or fluid collection. Musculoskeletal: No acute or abnormal lytic or blastic osseous lesions. Multilevel degenerative changes of the partially imaged thoracic and lumbar spine. IMPRESSION: 1. Trace fluid along the left anterior renal fascia, likely sequela of acute interstitial pancreatitis in the setting of elevated lipase. No parenchymal necrosis or peripancreatic fluid collection. 2. Layering hyperattenuation in the gallbladder, likely sludge versus stones. 3. Aortic Atherosclerosis (ICD10-I70.0). Coronary artery calcifications. Assessment for potential risk factor modification, dietary therapy or pharmacologic therapy may be warranted, if clinically indicated. Electronically Signed   By: Agustin Cree M.D.   On: 07/22/2022 08:35   DG Chest 2 View  Result Date: 07/21/2022 CLINICAL DATA:  Chest pain. EXAM: CHEST - 2 VIEW COMPARISON:  03/02/2014. FINDINGS: The heart size and mediastinal contours are within normal limits. Mild subsegmental atelectasis is noted at the left lung base. There is elevation of the right diaphragm. No effusion or pneumothorax. Degenerative changes are present in the thoracic spine. No acute osseous abnormality. IMPRESSION: Mild atelectasis at the left lung base. Electronically Signed   By: Thornell Sartorius M.D.   On:  07/21/2022 23:32    Pending Labs Unresulted Labs (From admission, onward)    None       Vitals/Pain Today's Vitals   07/22/22 0521 07/22/22 0522 07/22/22 0700 07/22/22 0745  BP: (!) 145/94  132/87 123/84  Pulse: 65  73 68  Resp: 13  19 16   Temp: 98.4 F (36.9 C)     TempSrc: Oral     SpO2: 99%  95% 94%  Weight:  104.3 kg    Height:  5\' 8"  (1.727 m)    PainSc:        Isolation Precautions No active isolations  Medications Medications  alum & mag hydroxide-simeth (MAALOX/MYLANTA) 200-200-20 MG/5ML suspension 30 mL (30 mLs Oral Given 07/22/22 0555)    And  lidocaine (XYLOCAINE) 2 % viscous mouth solution 15 mL (15 mLs Oral Given 07/22/22 0555)  sodium chloride 0.9 % bolus 1,000 mL (1,000 mLs Intravenous New Bag/Given 07/22/22 0836)  iohexol (OMNIPAQUE) 350 MG/ML injection 75 mL (75 mLs Intravenous Contrast Given 07/22/22 0822)    Mobility walks     Focused Assessments Cardiac Assessment Handoff:  Cardiac Rhythm: Normal sinus rhythm Lab Results  Component Value Date   CKTOTAL 148 09/16/2009   CKMB 1.4 09/16/2009   TROPONINI <0.01        NO INDICATION OF MYOCARDIAL INJURY. 09/16/2009   Lab Results  Component Value Date   DDIMER 0.50 07/22/2022   Does the Patient currently have chest pain? No    R Recommendations: See Admitting Provider Note  Report given to:   Additional Notes: VSS

## 2022-07-22 NOTE — ED Notes (Signed)
Patient transported to CT 

## 2022-07-22 NOTE — H&P (View-Only) (Signed)
Consult Note  Casey Miles 1965/07/10  [id number].    Requesting MD: Noralee Stain, DO Chief Complaint/Reason for Consult: Acute pancreatitis  HPI:  Patient is a 57 year old male who presented to the ED with abdominal pain that started around 1930 yesterday. He reported pain in LUQ that radiated to chest/mid-abdomen and back that felt like a tightness/pressure. Pain has been intermittent. He hadn't eaten much and thought symptoms seemed unusual. Tried Gas-x without any relief. Denied fever, chills, nausea, vomiting, diarrhea. He went to the fire department for evaluation and was found to have BP 220/130 and referred to the ED. He reports more mild episodes of pain over the last 2 years intermittently every few months. Has had a previous US that showed gallbladder sludge but not gallstones. PMH otherwise significant for HTN, HLD, mild CAD. Prior abdominal surgery includes umbilical hernia repair by Dr. Gerrit Friends in 2017. He is not on any blood thinners and NKDA. He denies heavy alcohol use and denies illicit drug or tobacco use. His wife is present at bedside. He works a Office manager.   ROS: Negative other than HPI  Family History  Problem Relation Age of Onset   Diabetes Mother    Hypertension Mother    Hyperlipidemia Mother    Diabetes Maternal Grandmother    Heart disease Maternal Grandmother    Colitis Maternal Grandmother    Diabetes Paternal Grandmother    Colitis Maternal Grandfather    Heart disease Maternal Grandfather    Heart attack Father 54   Lupus Cousin    Irritable bowel syndrome Other    Cancer Other        Esophageal- Great Grandfather   Stomach cancer Other        Uncle   Colon polyps Other        Grandmother   Diabetes Other        Family history of   Hypertension Other        Family history of   Heart attack Other        Family history of   Irritable bowel syndrome Other        Family history of   Colon cancer Neg Hx    Esophageal cancer Neg Hx     Rectal cancer Neg Hx     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   Elevated BP 10/08/2011   Elevated LFTs 06/19/2016   Fatigue 01/08/2011   GERD (gastroesophageal reflux disease)    Hyperglycemia 06/17/2016   Hyperlipidemia 10/08/2010   Kidney stone    NECK PAIN 09/26/2009   Neuromuscular disorder (HCC)    C4C5 herniated disc    Other acute reactions to stress 09/26/2009   Other and unspecified hyperlipidemia 01/23/2013   PARESTHESIA 11/22/2009   Preventative health care 08/01/2012   SHOULDER PAIN, LEFT 07/17/2008   Testosterone deficiency 07/09/2011   Vitamin D deficiency 07/08/2011    Past Surgical History:  Procedure Laterality Date   CARDIAC CATHETERIZATION     2013-2014   UMBILICAL HERNIA REPAIR  2017   UPPER GASTROINTESTINAL ENDOSCOPY      Social History:  reports that he has never smoked.  He has never used smokeless tobacco. He reports current alcohol use of about 1.0 - 2.0 standard drink of alcohol per week. He reports that he does not use drugs.  Allergies: No Known Allergies  Medications Prior to Admission  Medication Sig Dispense Refill   rosuvastatin (CRESTOR) 40 MG tablet Take 1 tablet (40 mg total) by mouth daily. 90 tablet 3    Blood pressure (!) 129/94, pulse 78, temperature 98.9 F (37.2 C), temperature source Oral, resp. rate 16, height 5\' 8"  (1.727 m), weight 104.3 kg, SpO2 96 %. Physical Exam:  General: pleasant, WD, WN male who is laying in bed in NAD HEENT: sclera anicteric  Heart: regular, rate, and rhythm. Palpable radial and pedal pulses bilaterally Lungs: CTAB, no wheezes, rhonchi, or rales noted.  Respiratory effort nonlabored Abd: soft, NT, mild fullness of  epigastric abdomen, +BS, no masses, hernias, or organomegaly MS: all 4 extremities are symmetrical with no cyanosis, clubbing, or edema. Skin: warm and dry with no masses, lesions, or rashes Neuro: Cranial nerves 2-12 grossly intact, sensation is normal throughout Psych: A&Ox3 with an appropriate affect.   Results for orders placed or performed during the hospital encounter of 07/21/22 (from the past 48 hour(s))  Basic metabolic panel     Status: Abnormal   Collection Time: 07/21/22 10:40 PM  Result Value Ref Range   Sodium 136 135 - 145 mmol/L   Potassium 4.4 3.5 - 5.1 mmol/L    Comment: HEMOLYSIS AT THIS LEVEL MAY AFFECT RESULT   Chloride 101 98 - 111 mmol/L   CO2 25 22 - 32 mmol/L   Glucose, Bld 176 (H) 70 - 99 mg/dL    Comment: Glucose reference range applies only to samples taken after fasting for at least 8 hours.   BUN 14 6 - 20 mg/dL   Creatinine, Ser 1.61 0.61 - 1.24 mg/dL   Calcium 8.9 8.9 - 09.6 mg/dL   GFR, Estimated >04 >54 mL/min    Comment: (NOTE) Calculated using the CKD-EPI Creatinine Equation (2021)    Anion gap 10 5 - 15    Comment: Performed at [facility], [address], [place], [place] [address]  CBC     Status: Abnormal   Collection Time: 07/21/22 10:40 PM  Result Value Ref Range   WBC 10.7 (H) 4.0 - 10.5 K/uL   RBC 4.56 4.22 - 5.81 MIL/uL   Hemoglobin 14.6 13.0 - 17.0 g/dL   HCT 91.4 78.2 - 95.6 %   MCV 91.7 80.0 - 100.0 fL   MCH 32.0 26.0 - 34.0 pg   MCHC 34.9 30.0 - 36.0 g/dL   RDW 21.3 08.6 - 57.8 %   Platelets 219 150 - 400 K/uL   nRBC 0.0 0.0 - 0.2 %    Comment: Performed at [facility], [address], [place], [place] [address]  Troponin I (High Sensitivity)     Status: None   Collection Time: 07/21/22 10:40 PM  Result Value Ref Range   Troponin I (High Sensitivity) 4 <18 ng/L    Comment: (NOTE) Elevated high sensitivity troponin I (hsTnI) values and significant  changes across serial measurements may suggest ACS but many  other  chronic and acute conditions are known to elevate hsTnI results.  Refer to the "Links" section for chest pain algorithms and additional  guidance. Performed at [facility], [address] [place], [place] [address]   Troponin I (High Sensitivity)     Status: None  Collection Time: 07/22/22  3:10 AM  Result Value Ref Range   Troponin I (High Sensitivity) 3 <18 ng/L    Comment: (NOTE) Elevated high sensitivity troponin I (hsTnI) values and significant  changes across serial measurements may suggest ACS but many other  chronic and acute conditions are known to elevate hsTnI results.  Refer to the "Links" section for chest pain algorithms and additional  guidance. Performed at [facility], [address], [place], [place] [address]   D-dimer, quantitative     Status: None   Collection Time: 07/22/22  6:05 AM  Result Value Ref Range   D-Dimer, Quant 0.50 0.00 - 0.50 ug/mL-FEU    Comment: (NOTE) At the manufacturer cut-off value of 0.5 g/mL FEU, this assay has a negative predictive value of 95-100%.This assay is intended for use in conjunction with a clinical pretest probability (PTP) assessment model to exclude pulmonary embolism (PE) and deep venous thrombosis (DVT) in outpatients suspected of PE or DVT. Results should be correlated with clinical presentation. Performed at [facility], [address], [place], [place] [address]   Hepatic function panel     Status: Abnormal   Collection Time: 07/22/22  6:48 AM  Result Value Ref Range   Total Protein 7.0 6.5 - 8.1 g/dL   Albumin 3.8 3.5 - 5.0 g/dL   AST 97 (H) 15 - 41 U/L   ALT 98 (H) 0 - 44 U/L   Alkaline Phosphatase 67 38 - 126 U/L   Total Bilirubin 1.0 0.3 - 1.2 mg/dL   Bilirubin, Direct 0.2 0.0 - 0.2 mg/dL   Indirect Bilirubin 0.8 0.3 - 0.9 mg/dL    Comment: Performed at [facility], [address] [place], [place] [address]  Lipase, blood     Status: Abnormal   Collection Time:  07/22/22  6:48 AM  Result Value Ref Range   Lipase 743 (H) 11 - 51 U/L    Comment: RESULTS CONFIRMED BY MANUAL DILUTION Performed at [facility], [address] [place], [place] [address]    US Abdomen Limited RUQ (LIVER/GB)  Result Date: 07/22/2022 CLINICAL DATA:  Right upper quadrant abdominal pain EXAM: ULTRASOUND ABDOMEN LIMITED RIGHT UPPER QUADRANT COMPARISON:  CT abdomen/pelvis dated 07/22/2022 FINDINGS: Gallbladder: Layering gallbladder sludge. No gallstones or wall thickening visualized. No sonographic Murphy sign noted by sonographer. Common bile duct: Diameter: 3 mm Liver: Hyperechoic hepatic parenchyma, suggesting hepatic steatosis, with focal fatty sparing along the gallbladder fossa. Portal vein is patent on color Doppler imaging with normal direction of blood flow towards the liver. Other: None. IMPRESSION: Layering gallbladder sludge, without associated inflammatory changes. Hepatic steatosis with focal fatty sparing. Electronically Signed   By: Charline Bills M.D.   On: 07/22/2022 10:05   CT ABDOMEN PELVIS W CONTRAST  Result Date: 07/22/2022 CLINICAL DATA:  Acute onset left upper quadrant abdominal pain associated with hypertension EXAM: CT ABDOMEN AND PELVIS WITH CONTRAST TECHNIQUE: Multidetector CT imaging of the abdomen and pelvis was performed using the standard protocol following bolus administration of intravenous contrast. RADIATION DOSE REDUCTION: This exam was performed according to the departmental dose-optimization program which includes automated exposure control, adjustment of the mA and/or kV according to patient size and/or use of iterative reconstruction technique. CONTRAST:  75mL OMNIPAQUE IOHEXOL 350 MG/ML SOLN COMPARISON:  CT abdomen and pelvis dated 08/03/2017 FINDINGS: Lower chest: No focal consolidation or pulmonary nodule in the lung bases. No pleural effusion or pneumothorax demonstrated. Partially imaged heart size  is normal. Coronary artery  calcification. Hepatobiliary: Increase in size of 2.6 cm segment 4 cyst (3:16), previously 1.9 cm. No intra or extrahepatic biliary ductal dilation. Layering hyperattenuation, likely sludge versus stones. Pancreas: No focal lesions or main ductal dilation. Heterogeneous hypoattenuation in the pancreatic head likely corresponds to uneven lipomatosis. Spleen: Normal in size without focal abnormality. Adrenals/Urinary Tract: No adrenal nodules. No suspicious renal mass, calculi or hydronephrosis. No focal bladder wall thickening. Stomach/Bowel: Normal appearance of the stomach. No evidence of bowel wall thickening, distention, or inflammatory changes. Normal appendix. Vascular/Lymphatic: Aortic atherosclerosis. Retroaortic left renal vein. No enlarged abdominal or pelvic lymph nodes. Reproductive: Prostate is unremarkable. Other: Trace fluid along the left anterior renal fascia. No free air or fluid collection. Musculoskeletal: No acute or abnormal lytic or blastic osseous lesions. Multilevel degenerative changes of the partially imaged thoracic and lumbar spine. IMPRESSION: 1. Trace fluid along the left anterior renal fascia, likely sequela of acute interstitial pancreatitis in the setting of elevated lipase. No parenchymal necrosis or peripancreatic fluid collection. 2. Layering hyperattenuation in the gallbladder, likely sludge versus stones. 3. Aortic Atherosclerosis (ICD10-I70.0). Coronary artery calcifications. Assessment for potential risk factor modification, dietary therapy or pharmacologic therapy may be warranted, if clinically indicated. Electronically Signed   By: Agustin Cree M.D.   On: 07/22/2022 08:35   DG Chest 2 View  Result Date: 07/21/2022 CLINICAL DATA:  Chest pain. EXAM: CHEST - 2 VIEW COMPARISON:  03/02/2014. FINDINGS: The heart size and mediastinal contours are within normal limits. Mild subsegmental atelectasis is noted at the left lung base. There is elevation of the right diaphragm. No  effusion or pneumothorax. Degenerative changes are present in the thoracic spine. No acute osseous abnormality. IMPRESSION: Mild atelectasis at the left lung base. Electronically Signed   By: Thornell Sartorius M.D.   On: 07/21/2022 23:32      Assessment/Plan Acute pancreatitis, probably biliary  - CT with pancreatitis but no necrosis or peripancreatic fluid collection, gallbladder sludge vs stones - RUQ [place] with layering sludge, no pericholecystic inflammatory changes - lipase 743 and AST/ALT mildly elevated - repeat labs in AM and recommend MRCP to evaluate for choledocholithiasis - if MRCP negative and labs improving, then likely plan for lap chole with IOC tomorrow  - ok to have liquids until midnight tonight   FEN: ok to have FLD from surgery standpoint, NPO after MN VTE: LMWH ID: no abx indicated from our standpoint   I reviewed ED provider notes, Consultant GI notes, hospitalist notes, last 24 h vitals and pain scores, last 48 h intake and output, last 24 h labs and trends, and last 24 h imaging results.   Juliet Rude, [facility] 07/22/2022, 1:11 PM Please see Amion for pager number during day hours 7:00am-4:30pm

## 2022-07-22 NOTE — H&P (Addendum)
History and Physical    Casey Miles:[id number] DOB: 20-Jun-1965 DOA: 07/21/2022  PCP: Bradd Canary, MD  Patient coming from: Home  Chief Complaint: Abdominal pain   HPI: Casey Miles is a 57 y.o. male with medical history significant of CAD, HLD who presents with 1 day history of epigastric and LUQ pain that radiated into his chest.  He states that he has had intermittent abdominal pain, especially with fatty meals, that would resolve spontaneously.  Last night, he started having pain again, although this time was not associated with having a fatty meal.  Pain continued to worsen significantly.  He went to the fire department, where his blood pressure was up to 220/130.  He was sent to the emergency department at that time.  Patient is seen alongside with GI PA.  Patient is doing better, pain controlled.  He denies any recent severe illnesses, no fevers.  Chest pain has resolved.  No nausea, vomiting.  Has some congestion from a recent cold.  Patient denies any significant alcohol intake history.  He is non-smoker.  ED Course: Troponin negative x 2.  D-dimer negative.  Lipase elevated 743.  CT abdomen pelvis revealed acute interstitial pancreatitis without peripancreatic fluid collection.  Right upper quadrant ultrasound showed layering gallbladder sludge without associated inflammatory changes.  GI consulted and patient admitted to the hospital.   Review of Systems: As per HPI. Otherwise, all other review of systems reviewed and are negative.   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   Elevated BP 10/08/2011   Elevated LFTs 06/19/2016   Fatigue 01/08/2011   GERD (gastroesophageal reflux disease)     Hyperglycemia 06/17/2016   Hyperlipidemia 10/08/2010   Kidney stone    NECK PAIN 09/26/2009   Neuromuscular disorder (HCC)    C4C5 herniated disc    Other acute reactions to stress 09/26/2009   Other and unspecified hyperlipidemia 01/23/2013   PARESTHESIA 11/22/2009   Preventative health care 08/01/2012   SHOULDER PAIN, LEFT 07/17/2008   Testosterone deficiency 07/09/2011   Vitamin D deficiency 07/08/2011    Past Surgical History:  Procedure Laterality Date   CARDIAC CATHETERIZATION     2013-2014   UMBILICAL HERNIA REPAIR  2017   UPPER GASTROINTESTINAL ENDOSCOPY       reports that he has never smoked. He has never used smokeless tobacco. He reports current alcohol use of about 1.0 - 2.0 standard drink of alcohol per week. He reports that he does not use drugs.  No Known Allergies  Family History  Problem Relation Age of Onset   Diabetes Mother    Hypertension Mother    Hyperlipidemia Mother    Diabetes Maternal Grandmother    Heart disease Maternal Grandmother    Colitis Maternal Grandmother    Diabetes Paternal Grandmother    Colitis Maternal Grandfather    Heart disease Maternal Grandfather    Heart attack Father 1   Lupus Cousin    Irritable bowel syndrome Other    Cancer Other        Esophageal- Great Grandfather   Stomach cancer Other        Uncle   Colon polyps Other  Grandmother   Diabetes Other        Family history of   Hypertension Other        Family history of   Heart attack Other        Family history of   Irritable bowel syndrome Other        Family history of   Colon cancer Neg Hx    Esophageal cancer Neg Hx    Rectal cancer Neg Hx     Prior to Admission medications   Medication Sig Start Date End Date Taking? Authorizing Provider  rosuvastatin (CRESTOR) 40 MG tablet Take 1 tablet (40 mg total) by mouth daily. 04/09/22  Yes Jake Bathe, MD    Physical Exam: Vitals:   07/22/22 0522 07/22/22 0700 07/22/22 0745 07/22/22 0930  BP:  132/87  123/84 139/88  Pulse:  73 68 66  Resp:  19 16 11   Temp:      TempSrc:      SpO2:  95% 94% 97%  Weight: 104.3 kg     Height: 5\' 8"  (1.727 m)       Constitutional: NAD, calm, comfortable Eyes: PERRL, lids and conjunctivae normal ENMT: Mucous membranes are moist. Normal dentition.  Respiratory: Clear to auscultation bilaterally, no wheezing, no crackles. Normal respiratory effort. No accessory muscle use. No conversational dyspnea on room air. Cardiovascular: Regular rate and rhythm, no murmurs. No extremity edema.  Abdomen: Soft, nondistended, minimal tenderness to palpation epigastrium.  Bowel sounds positive. Musculoskeletal: No joint deformity upper and lower extremities. No contractures. Normal muscle tone.  Skin: no rashes, lesions, ulcers on exposed skin  Neurologic: Alert and oriented, speech fluent Psychiatric: Normal judgment and insight. Normal mood and affect   Labs on Admission: I have personally reviewed following labs and imaging studies  CBC: Recent Labs  Lab 07/21/22 2240  WBC 10.7*  HGB 14.6  HCT 41.8  MCV 91.7  PLT 219   Basic Metabolic Panel: Recent Labs  Lab 07/21/22 2240  NA 136  K 4.4  CL 101  CO2 25  GLUCOSE 176*  BUN 14  CREATININE 1.13  CALCIUM 8.9   GFR: Estimated Creatinine Clearance: 87.5 mL/min (by C-G formula based on SCr of 1.13 mg/dL). Liver Function Tests: Recent Labs  Lab 07/22/22 0648  AST 97*  ALT 98*  ALKPHOS 67  BILITOT 1.0  PROT 7.0  ALBUMIN 3.8   Recent Labs  Lab 07/22/22 0648  LIPASE 743*   No results for input(s): "AMMONIA" in the last 168 hours. Coagulation Profile: No results for input(s): "INR", "PROTIME" in the last 168 hours. Cardiac Enzymes: No results for input(s): "CKTOTAL", "CKMB", "CKMBINDEX", "TROPONINI" in the last 168 hours. BNP (last 3 results) No results for input(s): "PROBNP" in the last 8760 hours. HbA1C: No results for input(s): "HGBA1C" in the last 72 hours. CBG: No results for  input(s): "GLUCAP" in the last 168 hours. Lipid Profile: No results for input(s): "CHOL", "HDL", "LDLCALC", "TRIG", "CHOLHDL", "LDLDIRECT" in the last 72 hours. Thyroid Function Tests: No results for input(s): "TSH", "T4TOTAL", "FREET4", "T3FREE", "THYROIDAB" in the last 72 hours. Anemia Panel: No results for input(s): "VITAMINB12", "FOLATE", "FERRITIN", "TIBC", "IRON", "RETICCTPCT" in the last 72 hours. Urine analysis:    Component Value Date/Time   COLORURINE YELLOW 08/05/2017 0001   APPEARANCEUR CLEAR 08/05/2017 0001   LABSPEC 1.020 08/05/2017 0001   PHURINE 6.0 08/05/2017 0001   GLUCOSEU NEGATIVE 08/05/2017 0001   HGBUR TRACE (A) 08/05/2017 0001   BILIRUBINUR NEGATIVE 08/05/2017 0001  KETONESUR NEGATIVE 08/05/2017 0001   PROTEINUR NEGATIVE 08/05/2017 0001   UROBILINOGEN 1.0 02/27/2009 1917   NITRITE NEGATIVE 08/05/2017 0001   LEUKOCYTESUR NEGATIVE 08/05/2017 0001   Sepsis Labs: !!!!!!!!!!!!!!!!!!!!!!!!!!!!!!!!!!!!!!!!!!!! @LABRCNTIP (procalcitonin:4,lacticidven:4) )No results found for this or any previous visit (from the past 240 hour(s)).   Radiological Exams on Admission: US Abdomen Limited RUQ (LIVER/GB)  Result Date: 07/22/2022 CLINICAL DATA:  Right upper quadrant abdominal pain EXAM: ULTRASOUND ABDOMEN LIMITED RIGHT UPPER QUADRANT COMPARISON:  CT abdomen/pelvis dated 07/22/2022 FINDINGS: Gallbladder: Layering gallbladder sludge. No gallstones or wall thickening visualized. No sonographic Murphy sign noted by sonographer. Common bile duct: Diameter: 3 mm Liver: Hyperechoic hepatic parenchyma, suggesting hepatic steatosis, with focal fatty sparing along the gallbladder fossa. Portal vein is patent on color Doppler imaging with normal direction of blood flow towards the liver. Other: None. IMPRESSION: Layering gallbladder sludge, without associated inflammatory changes. Hepatic steatosis with focal fatty sparing. Electronically Signed   By: Charline Bills M.D.   On: 07/22/2022  10:05   CT ABDOMEN PELVIS W CONTRAST  Result Date: 07/22/2022 CLINICAL DATA:  Acute onset left upper quadrant abdominal pain associated with hypertension EXAM: CT ABDOMEN AND PELVIS WITH CONTRAST TECHNIQUE: Multidetector CT imaging of the abdomen and pelvis was performed using the standard protocol following bolus administration of intravenous contrast. RADIATION DOSE REDUCTION: This exam was performed according to the departmental dose-optimization program which includes automated exposure control, adjustment of the mA and/or kV according to patient size and/or use of iterative reconstruction technique. CONTRAST:  75mL OMNIPAQUE IOHEXOL 350 MG/ML SOLN COMPARISON:  CT abdomen and pelvis dated 08/03/2017 FINDINGS: Lower chest: No focal consolidation or pulmonary nodule in the lung bases. No pleural effusion or pneumothorax demonstrated. Partially imaged heart size is normal. Coronary artery calcification. Hepatobiliary: Increase in size of 2.6 cm segment 4 cyst (3:16), previously 1.9 cm. No intra or extrahepatic biliary ductal dilation. Layering hyperattenuation, likely sludge versus stones. Pancreas: No focal lesions or main ductal dilation. Heterogeneous hypoattenuation in the pancreatic head likely corresponds to uneven lipomatosis. Spleen: Normal in size without focal abnormality. Adrenals/Urinary Tract: No adrenal nodules. No suspicious renal mass, calculi or hydronephrosis. No focal bladder wall thickening. Stomach/Bowel: Normal appearance of the stomach. No evidence of bowel wall thickening, distention, or inflammatory changes. Normal appendix. Vascular/Lymphatic: Aortic atherosclerosis. Retroaortic left renal vein. No enlarged abdominal or pelvic lymph nodes. Reproductive: Prostate is unremarkable. Other: Trace fluid along the left anterior renal fascia. No free air or fluid collection. Musculoskeletal: No acute or abnormal lytic or blastic osseous lesions. Multilevel degenerative changes of the partially  imaged thoracic and lumbar spine. IMPRESSION: 1. Trace fluid along the left anterior renal fascia, likely sequela of acute interstitial pancreatitis in the setting of elevated lipase. No parenchymal necrosis or peripancreatic fluid collection. 2. Layering hyperattenuation in the gallbladder, likely sludge versus stones. 3. Aortic Atherosclerosis (ICD10-I70.0). Coronary artery calcifications. Assessment for potential risk factor modification, dietary therapy or pharmacologic therapy may be warranted, if clinically indicated. Electronically Signed   By: Agustin Cree M.D.   On: 07/22/2022 08:35   DG Chest 2 View  Result Date: 07/21/2022 CLINICAL DATA:  Chest pain. EXAM: CHEST - 2 VIEW COMPARISON:  03/02/2014. FINDINGS: The heart size and mediastinal contours are within normal limits. Mild subsegmental atelectasis is noted at the left lung base. There is elevation of the right diaphragm. No effusion or pneumothorax. Degenerative changes are present in the thoracic spine. No acute osseous abnormality. IMPRESSION: Mild atelectasis at the left lung base. Electronically Signed  By: Thornell Sartorius M.D.   On: 07/21/2022 23:32    EKG: Independently reviewed. NSR without ST elevation   Assessment/Plan Principal Problem:   Gallstone pancreatitis Active Problems:   Mixed hyperlipidemia   Elevated LFTs   Gallstone pancreatitis -Lipase 743 -Right upper quadrant ultrasound showing gallbladder sludge, question if stone has passed -Supportive care with IV fluid, pain control, antiemetic -Consult GI, general surgery  Hyperlipidemia -Hold Crestor for now  Elevated LFTs -Trend   DVT prophylaxis: Lovenox   Code Status: Full  Family Communication: None at bedside  Disposition Plan: Home Consults called: GI, general surgery     Status is: Inpatient Remains inpatient appropriate because: IVF NPO   Severity of Illness: The appropriate patient status for this patient is INPATIENT. Inpatient status is  judged to be reasonable and necessary in order to provide the required intensity of service to ensure the patient's safety. The patient's presenting symptoms, physical exam findings, and initial radiographic and laboratory data in the context of their chronic comorbidities is felt to place them at high risk for further clinical deterioration. Furthermore, it is not anticipated that the patient will be medically stable for discharge from the hospital within 2 midnights of admission.   * I certify that at the point of admission it is my clinical judgment that the patient will require inpatient hospital care spanning beyond 2 midnights from the point of admission due to high intensity of service, high risk for further deterioration and high frequency of surveillance required.Noralee Stain, DO [facility] 07/22/2022, 10:12 AM   Available via Epic secure chat 7am-7pm After these hours, please refer to coverage provider listed on [email or web address]

## 2022-07-22 NOTE — ED Provider Notes (Signed)
Care assumed from Army Melia, PA-C at shift change pending labs.  See her note for full HPI.  In short, patient is a 57 year old male with a history of hypertension and hyperlipidemia who presents to the ED due to left upper quadrant abdominal pain that radiated to anterior chest, back, and flank region.  Strong family history of CAD.  Had a coronary CT scan in 2021 with a calcium score of 31 which was 75th percentile for age and gender. Reported to fire station upon onset of symptoms where he was found to be significantly hypertensive at 220/130 and given ASA and sent to ED for further evaluation. Physical Exam  BP (!) 145/94 (BP Location: Left Arm)   Pulse 65   Temp 98.4 F (36.9 C) (Oral)   Resp 13   Ht 5\' 8"  (1.727 m)   Wt 104.3 kg   SpO2 99%   BMI 34.96 kg/m   Physical Exam Vitals and nursing note reviewed.  Constitutional:      General: He is not in acute distress.    Appearance: He is not ill-appearing.  HENT:     Head: Normocephalic.  Eyes:     Pupils: Pupils are equal, round, and reactive to light.  Cardiovascular:     Rate and Rhythm: Normal rate and regular rhythm.     Pulses: Normal pulses.     Heart sounds: Normal heart sounds. No murmur heard.    No friction rub. No gallop.  Pulmonary:     Effort: Pulmonary effort is normal.     Breath sounds: Normal breath sounds.  Abdominal:     General: Abdomen is flat. There is no distension.     Palpations: Abdomen is soft.     Tenderness: There is abdominal tenderness. There is no guarding or rebound.  Musculoskeletal:        General: Normal range of motion.     Cervical back: Neck supple.  Skin:    General: Skin is warm and dry.  Neurological:     General: No focal deficit present.     Mental Status: He is alert.  Psychiatric:        Mood and Affect: Mood normal.        Behavior: Behavior normal.     Procedures  Procedures  ED Course / MDM   Clinical Course as of 07/22/22 0806  Tue Jul 22, 2022  0805  ALT(!): 98 [CA]  0805 AST(!): 97 [CA]  0805 Lipase(!): 743 [CA]    Clinical Course User Index [CA] Mannie Stabile, PA-C   Medical Decision Making Amount and/or Complexity of Data Reviewed Labs: ordered. Decision-making details documented in ED Course. Radiology: ordered.  Risk OTC drugs. Prescription drug management. Decision regarding hospitalization.   Care assumed from Army Melia, PA-C at shift change pending labs.   57 year old male presents to the ED due to left upper quadrant abdominal pain that migrated to chest, back, and flank region.  Found to be hypertensive at fire station in the 200s.  Strong family history of CAD.  I reviewed all labs from previous provider.  CBC significant for mild leukocytosis at 10.7.  Normal hemoglobin.  BMP significant for hyperglycemia 176.  No anion gap.  Normal renal function.  No major electrolyte derangements.  Troponin x 2 normal.  6:55 AM reassessed patient at bedside.  Patient resting comfortably in bed.  He admits to 4/10 pain under bilateral ribs.  Patient states pain has drastically improved since presentation.  No longer  having chest pain.  Mild tenderness in epigastric and left upper quadrant.  Added hepatic function panel and lipase.  Shared decision making in regards to dissection study and patient prefers to wait until labs have resulted and decide from there.  Lower suspicion for dissection given normal D-dimer and patient has been here for roughly 8.5 hours.  I would suspect that if he was dissecting, he would still be in significant pain and his clinical status would have declined at this point. BP improved from when he presented to fire station. At bedside, BP 130s systolic.    Lipase elevated at 743.  Transaminitis with AST at 97 and ALT at 98.  CT abdomen ordered.  Upon reassessment, patient resting comfortably in bed. Likely gallstone pancreatitis.  9:00 AM CT abdomen personally reviewed and interpreted which demonstrates  trace fluid along the left anterior renal fascia likely sequela of acute interstitial pancreatitis.  Also demonstrates sludge vs stone in gallbladder.  Right upper quadrant ultrasound ordered.  Patient will require admission for likely gallstone pancreatitis.  IV fluids started.  Patient resting comfortably in bed.  Declined pain medication.  Will discuss with GI.  9:12 AM discussed with Dr. Alvino Chapel with [facility] who agrees to admit patient for further treatment.  9:23 AM Spoke with Gunnar Fusi with [facility] who will see patient.           Jesusita Oka 07/22/22 1610    Nira Conn, MD 07/22/22 2116

## 2022-07-22 NOTE — Progress Notes (Signed)
Pt admitted from the ED for an admitting dx of pancreatitis r/t gallstones.  Pt was transferred via gurney accompanied by transporter.  VS were obtained and dual skin assessment performed with Gilman Buttner, RN.  Oriented pt to his surrounding and reviewed POC.  Answered any pending questions.  Instructed pt to utilize RN call light for any needs.  Ensures call light and personal belongings were within pt's reach.

## 2022-07-22 NOTE — TOC Initial Note (Signed)
Transition of Care [facility]) - Initial/Assessment Note    Patient Details  Name: Casey Miles MRN: [id number] Date of Birth: 08-Jan-1966  [facility]) CM/SW Contact:    Durenda Guthrie, RN Phone Number: 07/22/2022, 2:46 PM  Clinical Narrative:                  Transition of Care [facility]) Department has reviewed patient and no TOC needs have been identified at this time. We will continue to monitor patient advancement through Interdisciplinary progressions and if new patient needs arise, please place a consult.       Patient Goals and CMS Choice            Expected Discharge Plan and Services                                              Prior Living Arrangements/Services                       Activities of Daily Living Home Assistive Devices/Equipment: None ADL Screening (condition at time of admission) Patient's cognitive ability adequate to safely complete daily activities?: Yes Is the patient deaf or have difficulty hearing?: No Does the patient have difficulty seeing, even when wearing glasses/contacts?: No Does the patient have difficulty concentrating, remembering, or making decisions?: No Patient able to express need for assistance with ADLs?: No Does the patient have difficulty dressing or bathing?: No Independently performs ADLs?: Yes (appropriate for developmental age) Does the patient have difficulty walking or climbing stairs?: No Weakness of Legs: None Weakness of Arms/Hands: None  Permission Sought/Granted                  Emotional Assessment              Admission diagnosis:  Epigastric pain [R10.13] Gallstone pancreatitis [K85.10] Chest pain, unspecified type [R07.9] Acute left-sided back pain, unspecified back location [M54.9] Patient Active Problem List   Diagnosis Date Noted   Gallstone pancreatitis 07/22/2022   Coronary artery disease involving native coronary artery of native heart without angina  pectoris 01/17/2021   Hx of colonic polyp 04/05/2020   Gallbladder disorder 04/05/2020   Tinea corporis 04/06/2019   Tachycardia 12/26/2018   Rash 12/26/2018   Family history of early CAD 08/02/2018   Elevated LFTs 06/19/2016   Hyperglycemia 06/17/2016   Mixed hyperlipidemia 01/23/2013   Preventative health care 08/01/2012   Elevated BP 10/08/2011   Testosterone deficiency 07/09/2011   Vitamin D deficiency 07/08/2011   Fatigue 01/08/2011   Arthralgia of multiple sites 08/13/2010   COSTOCHONDRITIS, RECURRENT 05/06/2010   PARESTHESIA 11/22/2009   OTHER ACUTE REACTIONS TO STRESS 09/26/2009   CHRONIC RHINITIS 09/26/2009   NECK PAIN 09/26/2009   RUQ pain 09/26/2009   SHOULDER PAIN, LEFT 07/17/2008   DEGENERATIVE DISC DISEASE, 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   Atypical chest pain 05/04/2008   PCP:  Bradd Canary, MD Pharmacy:   [id number] - [place], [place] - [address] [address] Phone: [phone] Fax: [phone]     Social Determinants of Health (SDOH) Social History: SDOH Screenings   Food Insecurity: No Food Insecurity (07/22/2022)  Housing: Low Risk  (07/22/2022)  Transportation Needs: No Transportation Needs (07/22/2022)  Utilities: Not At Risk (07/22/2022)  Depression (PHQ2-9): Low Risk  (04/05/2020)  Tobacco Use:  Low Risk  (07/21/2022)   SDOH Interventions:     Readmission Risk Interventions     No data to display

## 2022-07-22 NOTE — Consult Note (Signed)
   Consult Note  Casey Miles 03/11/1965  [id number].    Requesting MD: Jennifer Choi, DO Chief Complaint/Reason for Consult: Acute pancreatitis  HPI:  Patient is a 56 year old male who presented to the ED with abdominal pain that started around 1930 yesterday. He reported pain in LUQ that radiated to chest/mid-abdomen and back that felt like a tightness/pressure. Pain has been intermittent. He hadn't eaten much and thought symptoms seemed unusual. Tried Gas-x without any relief. Denied fever, chills, nausea, vomiting, diarrhea. He went to the fire department for evaluation and was found to have BP 220/130 and referred to the ED. He reports more mild episodes of pain over the last 2 years intermittently every few months. Has had a previous US that showed gallbladder sludge but not gallstones. PMH otherwise significant for HTN, HLD, mild CAD. Prior abdominal surgery includes umbilical hernia repair by Dr. Gerkin in 2017. He is not on any blood thinners and NKDA. He denies heavy alcohol use and denies illicit drug or tobacco use. His wife is present at bedside. He works a desk job.   ROS: Negative other than HPI  Family History  Problem Relation Age of Onset   Diabetes Mother    Hypertension Mother    Hyperlipidemia Mother    Diabetes Maternal Grandmother    Heart disease Maternal Grandmother    Colitis Maternal Grandmother    Diabetes Paternal Grandmother    Colitis Maternal Grandfather    Heart disease Maternal Grandfather    Heart attack Father 52   Lupus Cousin    Irritable bowel syndrome Other    Cancer Other        Esophageal- Great Grandfather   Stomach cancer Other        Uncle   Colon polyps Other        Grandmother   Diabetes Other        Family history of   Hypertension Other        Family history of   Heart attack Other        Family history of   Irritable bowel syndrome Other        Family history of   Colon cancer Neg Hx    Esophageal cancer Neg Hx     Rectal cancer Neg Hx     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   Elevated BP 10/08/2011   Elevated LFTs 06/19/2016   Fatigue 01/08/2011   GERD (gastroesophageal reflux disease)    Hyperglycemia 06/17/2016   Hyperlipidemia 10/08/2010   Kidney stone    NECK PAIN 09/26/2009   Neuromuscular disorder (HCC)    C4C5 herniated disc    Other acute reactions to stress 09/26/2009   Other and unspecified hyperlipidemia 01/23/2013   PARESTHESIA 11/22/2009   Preventative health care 08/01/2012   SHOULDER PAIN, LEFT 07/17/2008   Testosterone deficiency 07/09/2011   Vitamin D deficiency 07/08/2011    Past Surgical History:  Procedure Laterality Date   CARDIAC CATHETERIZATION     2013-2014   UMBILICAL HERNIA REPAIR  2017   UPPER GASTROINTESTINAL ENDOSCOPY      Social History:  reports that he has never smoked.   He has never used smokeless tobacco. He reports current alcohol use of about 1.0 - 2.0 standard drink of alcohol per week. He reports that he does not use drugs.  Allergies: No Known Allergies  Medications Prior to Admission  Medication Sig Dispense Refill   rosuvastatin (CRESTOR) 40 MG tablet Take 1 tablet (40 mg total) by mouth daily. 90 tablet 3    Blood pressure (!) 129/94, pulse 78, temperature 98.9 F (37.2 C), temperature source Oral, resp. rate 16, height 5' 8" (1.727 m), weight 104.3 kg, SpO2 96 %. Physical Exam:  General: pleasant, WD, WN male who is laying in bed in NAD HEENT: sclera anicteric  Heart: regular, rate, and rhythm. Palpable radial and pedal pulses bilaterally Lungs: CTAB, no wheezes, rhonchi, or rales noted.  Respiratory effort nonlabored Abd: soft, NT, mild fullness of  epigastric abdomen, +BS, no masses, hernias, or organomegaly MS: all 4 extremities are symmetrical with no cyanosis, clubbing, or edema. Skin: warm and dry with no masses, lesions, or rashes Neuro: Cranial nerves 2-12 grossly intact, sensation is normal throughout Psych: A&Ox3 with an appropriate affect.   Results for orders placed or performed during the hospital encounter of 07/21/22 (from the past 48 hour(s))  Basic metabolic panel     Status: Abnormal   Collection Time: 07/21/22 10:40 PM  Result Value Ref Range   Sodium 136 135 - 145 mmol/L   Potassium 4.4 3.5 - 5.1 mmol/L    Comment: HEMOLYSIS AT THIS LEVEL MAY AFFECT RESULT   Chloride 101 98 - 111 mmol/L   CO2 25 22 - 32 mmol/L   Glucose, Bld 176 (H) 70 - 99 mg/dL    Comment: Glucose reference range applies only to samples taken after fasting for at least 8 hours.   BUN 14 6 - 20 mg/dL   Creatinine, Ser 1.13 0.61 - 1.24 mg/dL   Calcium 8.9 8.9 - 10.3 mg/dL   GFR, Estimated >60 >60 mL/min    Comment: (NOTE) Calculated using the CKD-EPI Creatinine Equation (2021)    Anion gap 10 5 - 15    Comment: Performed at [facility], [address] [place], [place] [address]  CBC     Status: Abnormal   Collection Time: 07/21/22 10:40 PM  Result Value Ref Range   WBC 10.7 (H) 4.0 - 10.5 K/uL   RBC 4.56 4.22 - 5.81 MIL/uL   Hemoglobin 14.6 13.0 - 17.0 g/dL   HCT 41.8 39.0 - 52.0 %   MCV 91.7 80.0 - 100.0 fL   MCH 32.0 26.0 - 34.0 pg   MCHC 34.9 30.0 - 36.0 g/dL   RDW 12.5 11.5 - 15.5 %   Platelets 219 150 - 400 K/uL   nRBC 0.0 0.0 - 0.2 %    Comment: Performed at [facility], [address] [place], [place] [address]  Troponin I (High Sensitivity)     Status: None   Collection Time: 07/21/22 10:40 PM  Result Value Ref Range   Troponin I (High Sensitivity) 4 <18 ng/L    Comment: (NOTE) Elevated high sensitivity troponin I (hsTnI) values and significant  changes across serial measurements may suggest ACS but many  other  chronic and acute conditions are known to elevate hsTnI results.  Refer to the "Links" section for chest pain algorithms and additional  guidance. Performed at [facility], [address] [place], [place] [address]   Troponin I (High Sensitivity)     Status: None     Collection Time: 07/22/22  3:10 AM  Result Value Ref Range   Troponin I (High Sensitivity) 3 <18 ng/L    Comment: (NOTE) Elevated high sensitivity troponin I (hsTnI) values and significant  changes across serial measurements may suggest ACS but many other  chronic and acute conditions are known to elevate hsTnI results.  Refer to the "Links" section for chest pain algorithms and additional  guidance. Performed at [facility], [address] [place], [place] [address]   D-dimer, quantitative     Status: None   Collection Time: 07/22/22  6:05 AM  Result Value Ref Range   D-Dimer, Quant 0.50 0.00 - 0.50 ug/mL-FEU    Comment: (NOTE) At the manufacturer cut-off value of 0.5 g/mL FEU, this assay has a negative predictive value of 95-100%.This assay is intended for use in conjunction with a clinical pretest probability (PTP) assessment model to exclude pulmonary embolism (PE) and deep venous thrombosis (DVT) in outpatients suspected of PE or DVT. Results should be correlated with clinical presentation. Performed at [facility], [address] [place], [place] [address]   Hepatic function panel     Status: Abnormal   Collection Time: 07/22/22  6:48 AM  Result Value Ref Range   Total Protein 7.0 6.5 - 8.1 g/dL   Albumin 3.8 3.5 - 5.0 g/dL   AST 97 (H) 15 - 41 U/L   ALT 98 (H) 0 - 44 U/L   Alkaline Phosphatase 67 38 - 126 U/L   Total Bilirubin 1.0 0.3 - 1.2 mg/dL   Bilirubin, Direct 0.2 0.0 - 0.2 mg/dL   Indirect Bilirubin 0.8 0.3 - 0.9 mg/dL    Comment: Performed at [facility], [address] [place], [place] [address]  Lipase, blood     Status: Abnormal   Collection Time:  07/22/22  6:48 AM  Result Value Ref Range   Lipase 743 (H) 11 - 51 U/L    Comment: RESULTS CONFIRMED BY MANUAL DILUTION Performed at  Hospital Lab, [address] [place], [place] [address]    US Abdomen Limited RUQ (LIVER/GB)  Result Date: 07/22/2022 CLINICAL DATA:  Right upper quadrant abdominal pain EXAM: ULTRASOUND ABDOMEN LIMITED RIGHT UPPER QUADRANT COMPARISON:  CT abdomen/pelvis dated 07/22/2022 FINDINGS: Gallbladder: Layering gallbladder sludge. No gallstones or wall thickening visualized. No sonographic Murphy sign noted by sonographer. Common bile duct: Diameter: 3 mm Liver: Hyperechoic hepatic parenchyma, suggesting hepatic steatosis, with focal fatty sparing along the gallbladder fossa. Portal vein is patent on color Doppler imaging with normal direction of blood flow towards the liver. Other: None. IMPRESSION: Layering gallbladder sludge, without associated inflammatory changes. Hepatic steatosis with focal fatty sparing. Electronically Signed   By: Sriyesh  Krishnan M.D.   On: 07/22/2022 10:05   CT ABDOMEN PELVIS W CONTRAST  Result Date: 07/22/2022 CLINICAL DATA:  Acute onset left upper quadrant abdominal pain associated with hypertension EXAM: CT ABDOMEN AND PELVIS WITH CONTRAST TECHNIQUE: Multidetector CT imaging of the abdomen and pelvis was performed using the standard protocol following bolus administration of intravenous contrast. RADIATION DOSE REDUCTION: This exam was performed according to the departmental dose-optimization program which includes automated exposure control, adjustment of the mA and/or kV according to patient size and/or use of iterative reconstruction technique. CONTRAST:  75mL OMNIPAQUE IOHEXOL 350 MG/ML SOLN COMPARISON:  CT abdomen and pelvis dated 08/03/2017 FINDINGS: Lower chest: No focal consolidation or pulmonary nodule in the lung bases. No pleural effusion or pneumothorax demonstrated. Partially imaged heart size   is normal. Coronary artery  calcification. Hepatobiliary: Increase in size of 2.6 cm segment 4 cyst (3:16), previously 1.9 cm. No intra or extrahepatic biliary ductal dilation. Layering hyperattenuation, likely sludge versus stones. Pancreas: No focal lesions or main ductal dilation. Heterogeneous hypoattenuation in the pancreatic head likely corresponds to uneven lipomatosis. Spleen: Normal in size without focal abnormality. Adrenals/Urinary Tract: No adrenal nodules. No suspicious renal mass, calculi or hydronephrosis. No focal bladder wall thickening. Stomach/Bowel: Normal appearance of the stomach. No evidence of bowel wall thickening, distention, or inflammatory changes. Normal appendix. Vascular/Lymphatic: Aortic atherosclerosis. Retroaortic left renal vein. No enlarged abdominal or pelvic lymph nodes. Reproductive: Prostate is unremarkable. Other: Trace fluid along the left anterior renal fascia. No free air or fluid collection. Musculoskeletal: No acute or abnormal lytic or blastic osseous lesions. Multilevel degenerative changes of the partially imaged thoracic and lumbar spine. IMPRESSION: 1. Trace fluid along the left anterior renal fascia, likely sequela of acute interstitial pancreatitis in the setting of elevated lipase. No parenchymal necrosis or peripancreatic fluid collection. 2. Layering hyperattenuation in the gallbladder, likely sludge versus stones. 3. Aortic Atherosclerosis (ICD10-I70.0). Coronary artery calcifications. Assessment for potential risk factor modification, dietary therapy or pharmacologic therapy may be warranted, if clinically indicated. Electronically Signed   By: Limin  Xu M.D.   On: 07/22/2022 08:35   DG Chest 2 View  Result Date: 07/21/2022 CLINICAL DATA:  Chest pain. EXAM: CHEST - 2 VIEW COMPARISON:  03/02/2014. FINDINGS: The heart size and mediastinal contours are within normal limits. Mild subsegmental atelectasis is noted at the left lung base. There is elevation of the right diaphragm. No  effusion or pneumothorax. Degenerative changes are present in the thoracic spine. No acute osseous abnormality. IMPRESSION: Mild atelectasis at the left lung base. Electronically Signed   By: Laura  Taylor M.D.   On: 07/21/2022 23:32      Assessment/Plan Acute pancreatitis, probably biliary  - CT with pancreatitis but no necrosis or peripancreatic fluid collection, gallbladder sludge vs stones - RUQ US with layering sludge, no pericholecystic inflammatory changes - lipase 743 and AST/ALT mildly elevated - repeat labs in AM and recommend MRCP to evaluate for choledocholithiasis - if MRCP negative and labs improving, then likely plan for lap chole with IOC tomorrow  - ok to have liquids until midnight tonight   FEN: ok to have FLD from surgery standpoint, NPO after MN VTE: LMWH ID: no abx indicated from our standpoint   I reviewed ED provider notes, Consultant GI notes, hospitalist notes, last 24 h vitals and pain scores, last 48 h intake and output, last 24 h labs and trends, and last 24 h imaging results.   Siddhi Dornbush R Marialy Urbanczyk, PA-C [facility] 07/22/2022, 1:11 PM Please see Amion for pager number during day hours 7:00am-4:30pm   

## 2022-07-22 NOTE — ED Provider Notes (Signed)
[place] EMERGENCY DEPARTMENT AT [facility] Provider Note   CSN: [id number] Arrival date & time: 07/21/22  2222     History  Chief Complaint  Patient presents with   Chest Pain    Casey Miles is a 57 y.o. male.  57 yo male with history of hypertension and hyperlipidemia, mild coronary artery disease seen on coronary CT scan 2021, strong family history of early CAD, presents with complaint of chest/abdominal/back pain. Patient states he was sitting on the sofa watching a show last night around 7:45pm last night when he developed pain in his LUQ described as a pressure/tightness, moved into his chest and around to his back and between his shoulder blades. Patient took gas-ex without relief of his pain, went to the fire department for evaluation. Patient was given ASA, found to be HTN with BP 220/130 and was recommended he go to the ER. Pain subsided at one point and returned, has not returned since taking the ASA. Denies nausea, vomiting, SHOB, diaphoresis. Current reports epigastric pressure.        Home Medications Prior to Admission medications   Medication Sig Start Date End Date Taking? Authorizing Provider  rosuvastatin (CRESTOR) 40 MG tablet Take 1 tablet (40 mg total) by mouth daily. 04/09/22  Yes Jake Bathe, MD      Allergies    Patient has no known allergies.    Review of Systems   Review of Systems Negative except as per HPI Physical Exam Updated Vital Signs BP (!) 145/94 (BP Location: Left Arm)   Pulse 65   Temp 98.4 F (36.9 C) (Oral)   Resp 13   Ht 5\' 8"  (1.727 m)   Wt 104.3 kg   SpO2 99%   BMI 34.96 kg/m  Physical Exam Vitals and nursing note reviewed.  Constitutional:      General: He is not in acute distress.    Appearance: He is well-developed. He is not diaphoretic.  HENT:     Head: Normocephalic and atraumatic.  Cardiovascular:     Rate and Rhythm: Normal rate and regular rhythm.     Pulses: Normal pulses.     Heart sounds:  Normal heart sounds.  Pulmonary:     Effort: Pulmonary effort is normal.     Breath sounds: Normal breath sounds.  Chest:     Chest wall: No tenderness.  Abdominal:     Palpations: Abdomen is soft.     Tenderness: There is abdominal tenderness in the epigastric area.  Musculoskeletal:     Cervical back: Neck supple.     Right lower leg: No tenderness. No edema.     Left lower leg: No tenderness. No edema.  Skin:    General: Skin is warm and dry.     Findings: No erythema or rash.  Neurological:     Mental Status: He is alert and oriented to person, place, and time.  Psychiatric:        Behavior: Behavior normal.     ED Results / Procedures / Treatments   Labs (all labs ordered are listed, but only abnormal results are displayed) Labs Reviewed  BASIC METABOLIC PANEL - Abnormal; Notable for the following components:      Result Value   Glucose, Bld 176 (*)    All other components within normal limits  CBC - Abnormal; Notable for the following components:   WBC 10.7 (*)    All other components within normal limits  D-DIMER, QUANTITATIVE  TROPONIN  I (HIGH SENSITIVITY)  TROPONIN I (HIGH SENSITIVITY)    EKG EKG Interpretation  Date/Time:  Monday Jul 21 2022 22:33:05 EDT Ventricular Rate:  82 PR Interval:  154 QRS Duration: 90 QT Interval:  392 QTC Calculation: 457 R Axis:   -29 Text Interpretation: Normal sinus rhythm Normal ECG When compared with ECG of 03-Nov-2009 00:40, PREVIOUS ECG IS PRESENT Artifact Otherwise no significant change Confirmed by Drema Pry [phone]) on 07/22/2022 5:40:07 AM  Radiology DG Chest 2 View  Result Date: 07/21/2022 CLINICAL DATA:  Chest pain. EXAM: CHEST - 2 VIEW COMPARISON:  03/02/2014. FINDINGS: The heart size and mediastinal contours are within normal limits. Mild subsegmental atelectasis is noted at the left lung base. There is elevation of the right diaphragm. No effusion or pneumothorax. Degenerative changes are present in the  thoracic spine. No acute osseous abnormality. IMPRESSION: Mild atelectasis at the left lung base. Electronically Signed   By: Thornell Sartorius M.D.   On: 07/21/2022 23:32    Procedures Procedures    Medications Ordered in ED Medications  alum & mag hydroxide-simeth (MAALOX/MYLANTA) 200-200-20 MG/5ML suspension 30 mL (30 mLs Oral Given 07/22/22 0555)    And  lidocaine (XYLOCAINE) 2 % viscous mouth solution 15 mL (15 mLs Oral Given 07/22/22 0555)    ED Course/ Medical Decision Making/ A&P                             Medical Decision Making Amount and/or Complexity of Data Reviewed Labs: ordered. Radiology: ordered.   This patient presents to the ED for concern of chest pain, this involves an extensive number of treatment options, and is a complaint that carries with it a high risk of complications and morbidity.  The differential diagnosis includes but limited to ACS, GERD, PE, arrhythmia, HTN emergency    Co morbidities that complicate the patient evaluation  As reviewed above per HPI   Additional history obtained:  Additional history obtained from wife at bedside who contributes to history as above External records from outside source obtained and reviewed including visit to cardiology dated 04/09/22 Cardiac cath dated 2012 no CAD Echo 2015 normal   Lab Tests:  I Ordered, and personally interpreted labs.  The pertinent results include:  trop 3 and 4. CBC without significant findings.  BMP with glucose 176, nonfasting.  D-dimer pending at time of shift change to oncoming provider.   Imaging Studies ordered:  I ordered imaging studies including CXR  I independently visualized and interpreted imaging which showed mild atelectasis left lung base I agree with the radiologist interpretation   Cardiac Monitoring: / EKG:  The patient was maintained on a cardiac monitor.  I personally viewed and interpreted the cardiac monitored which showed an underlying rhythm of: normal sinus  rhythm rate 82   Consultations Obtained:  I requested consultation with the ER attending, Dr. Eudelia Bunch,  and discussed lab and imaging findings as well as pertinent plan - they recommend: GI cocktail, d-dimer   Problem List / ED Course / Critical interventions / Medication management  57 year old male presents with complaint of pain as above with significantly elevated blood pressure.  Symptoms resolved after aspirin, only has epigastric discomfort with mild tenderness on exam.  DP pulses present.  Labs are reassuring.  Discussed with ER attending, plan is to provide GI cocktail, D-dimer, recheck.  Labs and recheck pending at time of signout,, if negative, can possibly be discharged to follow-up with cardiology  outpatient. I ordered medication including GI cocktail  for pain  Reevaluation of the patient after these medicines showed that the patient  recheck pending at time of shift change I have reviewed the patients home medicines and have made adjustments as needed   Social Determinants of Health:  Lives with spouse, has PCP and specialty care for follow-up   Test / Admission - Considered:  Disposition pending at time of shift change oncoming provider         Final Clinical Impression(s) / ED Diagnoses Final diagnoses:  Chest pain, unspecified type  Acute left-sided back pain, unspecified back location  Epigastric pain    Rx / DC Orders ED Discharge Orders     None         Jeannie Fend, PA-C 07/22/22 0617    Nira Conn, MD 07/22/22 2115

## 2022-07-22 NOTE — Consult Note (Addendum)
Consultation Note   Referring Provider:  [facility] PCP: Bradd Canary, MD Primary Gastroenterologist: Lynann Bologna, MD Reason for consultation: Gallstone pancreatitis  DOA: 07/21/2022         Hospital Day: 2   Assessment and Plan   57 year old male with pancreatitis, likely biliary.  Sludge versus gallstones in the gallbladder.  No evidence for choledocholithiasis -Supportive care with IV fluids, analgesics, DVT prevention. Received one liter of IV fluids and now getting 125 ml / hr. Will change to 200 ml / hr for next 6 hours then 125 ml / hr.  -NPO for now. Depending on clinical course will consider clear liquids later today -General surgery consultation for consideration of a cholecystectomy  Mildly elevated liver enzymes < 3 x ULN. Etiology?  - monitor for now.   Hepatic cyst.  Size has increased from 1.9 to 2.6 cm on CT scan  Hyperlipidemia -Home statin on hold  History of Present Illness Patient is a 57 y.o. year old male whose past medical history includes but is not necessarily limited to hyperlipidemia, adenomatous colon polyps  Casey Miles is known to Dr. Chales Abrahams in our practice for history of colon polyps.  He is up-to-date on surveillance colonoscopy.   Periodically gets upper abdominal discomfort and bloating if he consumes fatty meals for a couple of days in a row. Yesterday, without having had any fatty foods he developed the same epigastric / LUQ discomfort which began radiating into his chest and around his side into the back. Gas-X didn't help. No associated N/V. For persistent pain he presented to ED and found to have pancreatitis.  Patient rarely consumes alcohol.  No family history of pancreatic diseases/pancreatic cancer.  His only medication is a statin which he has been on for 1.5 years.  Non-smoker  ED findings:  Lipase 743. AT 97 / ALT 98. Normal alk phos and bilirubin. WBC 10.7. Hgb 14.6.  RUQ US>> hepatic  steatosis, layering sludge in gallbladder. No biliary duct dilation. No evidence for acute cholecystitis.  CT scan w contrast >Trace fluid along the left anterior renal fascia, likely sequela of acute interstitial pancreatitis . Heterogeneous hypoattenuation in the pancreatic head likely corresponds to uneven lipomatosis.No pancreatic lesions or main duct dilation.  Likely sludge versus stones in gallbladder.   Regarding chest pain his troponin is normal. No associated SHOB. D-dimer normal. EKG without ST elevation. No acute findings on CXR. Chest pain has resolved.   No other GI complaints.  Labs and Imaging: Recent Labs    07/21/22 2240  WBC 10.7*  HGB 14.6  HCT 41.8  PLT 219   Recent Labs    07/21/22 2240  NA 136  K 4.4  CL 101  CO2 25  GLUCOSE 176*  BUN 14  CREATININE 1.13  CALCIUM 8.9   Recent Labs    07/22/22 0648  PROT 7.0  ALBUMIN 3.8  AST 97*  ALT 98*  ALKPHOS 67  BILITOT 1.0  BILIDIR 0.2  IBILI 0.8   No results for input(s): "HEPBSAG", "HCVAB", "HEPAIGM", "HEPBIGM" in the last 72 hours. No results for input(s): "LABPROT", "INR" in the last 72 hours.  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   Elevated BP 10/08/2011   Elevated LFTs 06/19/2016   Fatigue 01/08/2011   GERD (gastroesophageal reflux disease)    Hyperglycemia 06/17/2016   Hyperlipidemia 10/08/2010   Kidney stone    NECK PAIN 09/26/2009   Neuromuscular disorder (HCC)    C4C5 herniated disc    Other acute reactions to stress 09/26/2009   Other and unspecified hyperlipidemia 01/23/2013   PARESTHESIA 11/22/2009   Preventative health care 08/01/2012   SHOULDER  PAIN, LEFT 07/17/2008   Testosterone deficiency 07/09/2011   Vitamin D deficiency 07/08/2011    Past Surgical History:  Procedure Laterality Date   CARDIAC CATHETERIZATION     2013-2014   UMBILICAL HERNIA REPAIR  2017   UPPER GASTROINTESTINAL ENDOSCOPY      Family History  Problem Relation Age of Onset   Diabetes Mother    Hypertension Mother    Hyperlipidemia Mother    Diabetes Maternal Grandmother    Heart disease Maternal Grandmother    Colitis Maternal Grandmother    Diabetes Paternal Grandmother    Colitis Maternal Grandfather    Heart disease Maternal Grandfather    Heart attack Father 92   Lupus Cousin    Irritable bowel syndrome Other    Cancer Other        Esophageal- Great Grandfather   Stomach cancer Other        Uncle   Colon polyps Other        Grandmother   Diabetes Other        Family history of   Hypertension Other        Family history of   Heart attack Other        Family history of   Irritable bowel syndrome Other        Family history of   Colon cancer Neg Hx    Esophageal cancer Neg Hx    Rectal cancer Neg Hx     Prior to Admission medications   Medication Sig Start Date End Date Taking? Authorizing Provider  rosuvastatin (CRESTOR) 40 MG tablet Take 1 tablet (40 mg total) by mouth daily. 04/09/22  Yes Jake Bathe, MD    No current facility-administered medications for this encounter.   Current Outpatient Medications  Medication Sig Dispense Refill   rosuvastatin (CRESTOR) 40 MG tablet Take 1 tablet (40 mg total) by mouth daily. 90 tablet 3    Allergies as of 07/21/2022   (No Known Allergies)    Social History   Socioeconomic History   Marital status: Married    Spouse name: Not on file   Number of children: Not on file   Years of education: Not on file   Highest education level: Not on file  Occupational History   Not on file  Tobacco Use   Smoking status: Never   Smokeless tobacco: Never  Vaping Use   Vaping Use: Never used   Substance and Sexual Activity   Alcohol use: Yes    Alcohol/week: 1.0 - 2.0 standard drink of alcohol    Types: 1 - 2 Standard drinks or equivalent per week    Comment: 1-2 per week   Drug use: No   Sexual  activity: Yes    Comment: lives wife and children, exercises intermittently, heart healthy diet.  Other Topics Concern   Not on file  Social History Narrative   Not on file   Social Determinants of Health   Financial Resource Strain: Not on file  Food Insecurity: Not on file  Transportation Needs: Not on file  Physical Activity: Not on file  Stress: Not on file  Social Connections: Not on file  Intimate Partner Violence: Not on file     Code Status   Code Status: Not on file  Review of Systems: All systems reviewed and negative except where noted in HPI.  Physical Exam: Vital signs in last 24 hours: Temp:  [98.3 F (36.8 C)-98.4 F (36.9 C)] 98.4 F (36.9 C) (05/21 0521) Pulse Rate:  [65-83] 66 (05/21 0930) Resp:  [11-19] 11 (05/21 0930) BP: (123-164)/(84-94) 139/88 (05/21 0930) SpO2:  [94 %-99 %] 97 % (05/21 0930) Weight:  [104.3 kg] 104.3 kg (05/21 0522)    General:  Pleasant male in NAD Psych:  Cooperative. Normal mood and affect Eyes: Pupils equal Ears:  Normal auditory acuity Nose: No deformity, discharge or lesions Neck:  Supple, no masses felt Lungs:  Clear to auscultation.  Heart:  Regular rate, regular rhythm.  Abdomen:  Soft, nondistended, mild epigastric tenderness, active bowel sounds, no masses felt Rectal :  Deferred Msk: Symmetrical without gross deformities.  Neurologic:  Alert, oriented, grossly normal neurologically Extremities : No edema Skin:  Intact without significant lesions.    Intake/Output from previous day: No intake/output data recorded. Intake/Output this shift:  No intake/output data recorded.     Willette Cluster, NP-C @  07/22/2022, 10:07 AM

## 2022-07-23 ENCOUNTER — Inpatient Hospital Stay (HOSPITAL_COMMUNITY): Payer: No Typology Code available for payment source | Admitting: Anesthesiology

## 2022-07-23 ENCOUNTER — Encounter (HOSPITAL_COMMUNITY)
Admission: EM | Disposition: A | Discharge status: Home / Self Care | Payer: Self-pay | Source: Home / Self Care | Attending: Internal Medicine

## 2022-07-23 ENCOUNTER — Inpatient Hospital Stay (HOSPITAL_COMMUNITY): Payer: No Typology Code available for payment source

## 2022-07-23 ENCOUNTER — Encounter (HOSPITAL_COMMUNITY): Payer: Self-pay | Admitting: Internal Medicine

## 2022-07-23 ENCOUNTER — Other Ambulatory Visit: Payer: Self-pay

## 2022-07-23 DIAGNOSIS — F419 Anxiety disorder, unspecified: Secondary | ICD-10-CM | POA: Diagnosis not present

## 2022-07-23 DIAGNOSIS — R7989 Other specified abnormal findings of blood chemistry: Secondary | ICD-10-CM

## 2022-07-23 DIAGNOSIS — K851 Biliary acute pancreatitis without necrosis or infection: Secondary | ICD-10-CM | POA: Diagnosis not present

## 2022-07-23 DIAGNOSIS — I2510 Atherosclerotic heart disease of native coronary artery without angina pectoris: Secondary | ICD-10-CM | POA: Diagnosis not present

## 2022-07-23 DIAGNOSIS — K7689 Other specified diseases of liver: Secondary | ICD-10-CM | POA: Diagnosis not present

## 2022-07-23 DIAGNOSIS — E782 Mixed hyperlipidemia: Secondary | ICD-10-CM | POA: Diagnosis not present

## 2022-07-23 HISTORY — PX: CHOLECYSTECTOMY: SHX55

## 2022-07-23 LAB — COMPREHENSIVE METABOLIC PANEL
ALT: 64 U/L — ABNORMAL HIGH (ref 0–44)
ALT: 75 U/L — ABNORMAL HIGH (ref 0–44)
AST: 47 U/L — ABNORMAL HIGH (ref 15–41)
AST: 61 U/L — ABNORMAL HIGH (ref 15–41)
Albumin: 3.3 g/dL — ABNORMAL LOW (ref 3.5–5.0)
Albumin: 3.4 g/dL — ABNORMAL LOW (ref 3.5–5.0)
Alkaline Phosphatase: 60 U/L (ref 38–126)
Alkaline Phosphatase: 58 U/L (ref 38–126)
Anion gap: 6 (ref 5–15)
Anion gap: 10 (ref 5–15)
BUN: 9 mg/dL (ref 6–20)
BUN: 10 mg/dL (ref 6–20)
CO2: 25 mmol/L (ref 22–32)
CO2: 23 mmol/L (ref 22–32)
Calcium: 8.6 mg/dL — ABNORMAL LOW (ref 8.9–10.3)
Calcium: 8.5 mg/dL — ABNORMAL LOW (ref 8.9–10.3)
Chloride: 106 mmol/L (ref 98–111)
Chloride: 103 mmol/L (ref 98–111)
Creatinine, Ser: 1.08 mg/dL (ref 0.61–1.24)
Creatinine, Ser: 1.09 mg/dL (ref 0.61–1.24)
GFR, Estimated: >60 mL/min (ref >60)
GFR, Estimated: >60 mL/min (ref >60)
Glucose, Bld: 99 mg/dL (ref 70–99)
Glucose, Bld: 153 mg/dL — ABNORMAL HIGH (ref 70–99)
Potassium: 4 mmol/L (ref 3.5–5.1)
Potassium: 4.2 mmol/L (ref 3.5–5.1)
Sodium: 137 mmol/L (ref 135–145)
Sodium: 136 mmol/L (ref 135–145)
Total Bilirubin: 1.4 mg/dL — ABNORMAL HIGH (ref 0.3–1.2)
Total Bilirubin: 0.8 mg/dL (ref 0.3–1.2)
Total Protein: 6.1 g/dL — ABNORMAL LOW (ref 6.5–8.1)
Total Protein: 6.1 g/dL — ABNORMAL LOW (ref 6.5–8.1)

## 2022-07-23 LAB — CBC
HCT: 39 % (ref 39.0–52.0)
Hemoglobin: 13.5 g/dL (ref 13.0–17.0)
MCH: 31.3 pg (ref 26.0–34.0)
MCHC: 34.6 g/dL (ref 30.0–36.0)
MCV: 90.5 fL (ref 80.0–100.0)
Platelets: 168 10*3/uL (ref 150–400)
RBC: 4.31 MIL/uL (ref 4.22–5.81)
RDW: 12.4 % (ref 11.5–15.5)
WBC: 7.1 10*3/uL (ref 4.0–10.5)
nRBC: 0 % (ref 0.0–0.2)

## 2022-07-23 LAB — LIPASE, BLOOD: Lipase: 90 U/L — ABNORMAL HIGH (ref 11–51)

## 2022-07-23 SURGERY — LAPAROSCOPIC CHOLECYSTECTOMY WITH INTRAOPERATIVE CHOLANGIOGRAM
Anesthesia: General | Site: Abdomen

## 2022-07-23 MED ORDER — ONDANSETRON HCL 4 MG/2ML IJ SOLN
INTRAMUSCULAR | Status: DC | PRN
  Administered 2022-07-23: 4 mg via INTRAVENOUS

## 2022-07-23 MED ORDER — OXYCODONE HCL 5 MG/5ML PO SOLN: 5.0000 mg | Freq: Once | ORAL | Status: DC | PRN

## 2022-07-23 MED ORDER — IBUPROFEN 200 MG PO TABS
600.0000 mg | ORAL_TABLET | Freq: Four times a day (QID) | ORAL | Status: DC | PRN
  Administered 2022-07-23 – 2022-07-24 (×2): 600 mg via ORAL
  Filled 2022-07-23 (×2): qty 3

## 2022-07-23 MED ORDER — ACETAMINOPHEN 10 MG/ML IV SOLN
INTRAVENOUS | Status: DC | PRN
  Administered 2022-07-23: 1000 mg via INTRAVENOUS

## 2022-07-23 MED ORDER — AMISULPRIDE (ANTIEMETIC) 5 MG/2ML IV SOLN: 10.0000 mg | Freq: Once | INTRAVENOUS | Status: DC | PRN

## 2022-07-23 MED ORDER — SODIUM CHLORIDE 0.9 % IV SOLN
INTRAVENOUS | Status: DC | PRN
  Administered 2022-07-23: 3 mL

## 2022-07-23 MED ORDER — DEXAMETHASONE SODIUM PHOSPHATE 10 MG/ML IJ SOLN
INTRAMUSCULAR | Status: DC | PRN
  Administered 2022-07-23: 10 mg via INTRAVENOUS

## 2022-07-23 MED ORDER — SODIUM CHLORIDE 0.9 % IR SOLN
Status: DC | PRN
  Administered 2022-07-23 (×2): 1000 mL

## 2022-07-23 MED ORDER — SUGAMMADEX SODIUM 200 MG/2ML IV SOLN
INTRAVENOUS | Status: DC | PRN
  Administered 2022-07-23: 400 mg via INTRAVENOUS

## 2022-07-23 MED ORDER — FENTANYL CITRATE (PF) 250 MCG/5ML IJ SOLN
INTRAMUSCULAR | Status: AC
  Filled 2022-07-23: qty 5

## 2022-07-23 MED ORDER — FENTANYL CITRATE (PF) 100 MCG/2ML IJ SOLN
INTRAMUSCULAR | Status: AC
  Filled 2022-07-23: qty 2

## 2022-07-23 MED ORDER — LACTATED RINGERS IV SOLN: INTRAVENOUS | Status: DC

## 2022-07-23 MED ORDER — MIDAZOLAM HCL 2 MG/2ML IJ SOLN
INTRAMUSCULAR | Status: DC | PRN
  Administered 2022-07-23: 2 mg via INTRAVENOUS

## 2022-07-23 MED ORDER — FENTANYL CITRATE (PF) 100 MCG/2ML IJ SOLN: 25.0000 ug | INTRAMUSCULAR | Status: DC | PRN

## 2022-07-23 MED ORDER — LIDOCAINE 2% (20 MG/ML) 5 ML SYRINGE
INTRAMUSCULAR | Status: DC | PRN
  Administered 2022-07-23: 60 mg via INTRAVENOUS

## 2022-07-23 MED ORDER — CHLORHEXIDINE GLUCONATE 0.12 % MT SOLN
OROMUCOSAL | Status: AC
  Administered 2022-07-23: 15 mL via OROMUCOSAL
  Filled 2022-07-23: qty 15

## 2022-07-23 MED ORDER — MIDAZOLAM HCL 2 MG/2ML IJ SOLN
INTRAMUSCULAR | Status: AC
  Filled 2022-07-23: qty 2

## 2022-07-23 MED ORDER — OXYCODONE HCL 5 MG PO TABS: 5.0000 mg | ORAL_TABLET | Freq: Once | ORAL | Status: DC | PRN

## 2022-07-23 MED ORDER — ACETAMINOPHEN 10 MG/ML IV SOLN
INTRAVENOUS | Status: AC
  Filled 2022-07-23: qty 100

## 2022-07-23 MED ORDER — BUPIVACAINE-EPINEPHRINE (PF) 0.25% -1:200000 IJ SOLN
INTRAMUSCULAR | Status: AC
  Filled 2022-07-23: qty 30

## 2022-07-23 MED ORDER — PROPOFOL 10 MG/ML IV BOLUS
INTRAVENOUS | Status: DC | PRN
  Administered 2022-07-23: 200 mg via INTRAVENOUS

## 2022-07-23 MED ORDER — FENTANYL CITRATE (PF) 250 MCG/5ML IJ SOLN
INTRAMUSCULAR | Status: DC | PRN
  Administered 2022-07-23 (×2): 100 ug via INTRAVENOUS
  Administered 2022-07-23: 50 ug via INTRAVENOUS

## 2022-07-23 MED ORDER — CEFAZOLIN SODIUM-DEXTROSE 2-4 GM/100ML-% IV SOLN
2.0000 g | Freq: Once | INTRAVENOUS | Status: AC
  Administered 2022-07-23: 2 g via INTRAVENOUS
  Filled 2022-07-23: qty 100

## 2022-07-23 MED ORDER — PROPOFOL 10 MG/ML IV BOLUS
INTRAVENOUS | Status: AC
  Filled 2022-07-23: qty 20

## 2022-07-23 MED ORDER — CHLORHEXIDINE GLUCONATE 0.12 % MT SOLN
15.0000 mL | OROMUCOSAL | Status: AC
  Filled 2022-07-23: qty 15

## 2022-07-23 MED ORDER — 0.9 % SODIUM CHLORIDE (POUR BTL) OPTIME
TOPICAL | Status: DC | PRN
  Administered 2022-07-23: 1000 mL

## 2022-07-23 MED ORDER — ROCURONIUM BROMIDE 10 MG/ML (PF) SYRINGE
PREFILLED_SYRINGE | INTRAVENOUS | Status: DC | PRN
  Administered 2022-07-23: 100 mg via INTRAVENOUS

## 2022-07-23 MED ORDER — BUPIVACAINE-EPINEPHRINE 0.25% -1:200000 IJ SOLN
INTRAMUSCULAR | Status: DC | PRN
  Administered 2022-07-23: 17 mL

## 2022-07-23 SURGICAL SUPPLY — 41 items
ADH SKN CLS APL DERMABOND .7 (GAUZE/BANDAGES/DRESSINGS) ×1
APL PRP STRL LF DISP 70% ISPRP (MISCELLANEOUS) ×1
APPLIER CLIP 5 13 M/L LIGAMAX5 (MISCELLANEOUS) ×1
APR CLP MED LRG 5 ANG JAW (MISCELLANEOUS) ×1
BAG COUNTER SPONGE SURGICOUNT (BAG) ×1 IMPLANT
BAG SPNG CNTER NS LX DISP (BAG) ×1
BLADE CLIPPER SURG (BLADE) IMPLANT
CANISTER SUCT 3000ML PPV (MISCELLANEOUS) ×1 IMPLANT
CATH REDDICK CHOLANGI 4FR 50CM (CATHETERS) ×1 IMPLANT
CHLORAPREP W/TINT 26 (MISCELLANEOUS) ×1 IMPLANT
CLIP APPLIE 5 13 M/L LIGAMAX5 (MISCELLANEOUS) ×1 IMPLANT
COVER MAYO STAND STRL (DRAPES) ×1 IMPLANT
COVER SURGICAL LIGHT HANDLE (MISCELLANEOUS) ×1 IMPLANT
DERMABOND ADVANCED .7 DNX12 (GAUZE/BANDAGES/DRESSINGS) ×1 IMPLANT
DRAPE C-ARM 42X120 X-RAY (DRAPES) ×1 IMPLANT
ELECT REM PT RETURN 9FT ADLT (ELECTROSURGICAL) ×1
ELECTRODE REM PT RTRN 9FT ADLT (ELECTROSURGICAL) ×1 IMPLANT
GLOVE BIO SURGEON STRL SZ7.5 (GLOVE) ×1 IMPLANT
GOWN STRL REUS W/ TWL LRG LVL3 (GOWN DISPOSABLE) ×3 IMPLANT
GOWN STRL REUS W/TWL LRG LVL3 (GOWN DISPOSABLE) ×3
IRRIG SUCT STRYKERFLOW 2 WTIP (MISCELLANEOUS) ×1
IRRIGATION SUCT STRKRFLW 2 WTP (MISCELLANEOUS) ×1 IMPLANT
IV CATH 14GX2 1/4 (CATHETERS) ×1 IMPLANT
KIT BASIN OR (CUSTOM PROCEDURE TRAY) ×1 IMPLANT
KIT TURNOVER KIT B (KITS) ×1 IMPLANT
NS IRRIG 1000ML POUR BTL (IV SOLUTION) ×1 IMPLANT
PAD ARMBOARD 7.5X6 YLW CONV (MISCELLANEOUS) ×1 IMPLANT
SCISSORS LAP 5X35 DISP (ENDOMECHANICALS) ×1 IMPLANT
SET TUBE SMOKE EVAC HIGH FLOW (TUBING) ×1 IMPLANT
SLEEVE Z-THREAD 5X100MM (TROCAR) ×2 IMPLANT
SPECIMEN JAR SMALL (MISCELLANEOUS) ×1 IMPLANT
SUT MNCRL AB 4-0 PS2 18 (SUTURE) ×1 IMPLANT
SYS BAG RETRIEVAL 10MM (BASKET) ×1
SYSTEM BAG RETRIEVAL 10MM (BASKET) ×1 IMPLANT
TOWEL GREEN STERILE (TOWEL DISPOSABLE) ×1 IMPLANT
TOWEL GREEN STERILE FF (TOWEL DISPOSABLE) ×1 IMPLANT
TRAY LAPAROSCOPIC MC (CUSTOM PROCEDURE TRAY) ×1 IMPLANT
TROCAR BALLN 12MMX100 BLUNT (TROCAR) ×1 IMPLANT
TROCAR Z-THREAD OPTICAL 5X100M (TROCAR) ×1 IMPLANT
WARMER LAPAROSCOPE (MISCELLANEOUS) ×1 IMPLANT
WATER STERILE IRR 1000ML POUR (IV SOLUTION) ×1 IMPLANT

## 2022-07-23 NOTE — Transfer of Care (Signed)
Immediate Anesthesia Transfer of Care Note  Patient: Casey Miles  Procedure(s) Performed: LAPAROSCOPIC CHOLECYSTECTOMY WITH INTRAOPERATIVE CHOLANGIOGRAM (Abdomen)  Patient Location: PACU  Anesthesia Type:General  Level of Consciousness: awake, alert , and oriented  Airway & Oxygen Therapy: Patient Spontanous Breathing and Patient connected to nasal cannula oxygen  Post-op Assessment: Report given to RN and Post -op Vital signs reviewed and stable  Post vital signs: Reviewed and stable  Last Vitals:  Vitals Value Taken Time  BP 144/83 07/23/22 1106  Temp 36.9 C 07/23/22 1106  Pulse 81 07/23/22 1111  Resp 13 07/23/22 1111  SpO2 92 % 07/23/22 1111  Vitals shown include unvalidated device data.  Last Pain:  Vitals:   07/23/22 1106  TempSrc:   PainSc: Asleep         Complications: No notable events documented.

## 2022-07-23 NOTE — Interval H&P Note (Signed)
History and Physical Interval Note:  07/23/2022 8:48 AM  Casey Miles  has presented today for surgery, with the diagnosis of gallstone pancreatitis.  The various methods of treatment have been discussed with the patient and family. After consideration of risks, benefits and other options for treatment, the patient has consented to  Procedure(s): LAPAROSCOPIC CHOLECYSTECTOMY WITH INTRAOPERATIVE CHOLANGIOGRAM , POSSIBLE ICG DYE (N/A) as a surgical intervention.  The patient's history has been reviewed, patient examined, no change in status, stable for surgery.  I have reviewed the patient's chart and labs.  Questions were answered to the patient's satisfaction.     Chevis Pretty III

## 2022-07-23 NOTE — Progress Notes (Signed)
Daily Progress Note  DOA: 07/21/2022 Hospital Day: 3 Chief Complaint: gallstone pancreatitis   Assessment and Plan:    Brief Narrative:  Casey Miles is a 57 y.o. year old male whose past medical history includes, but isn't necessarily limited to, HLD, colon polyps  57 year old male with pancreatitis, likely biliary.  Sludge versus gallstones in the gallbladder.  No evidence for choledocholithiasis on MRCP.  -He is s/p lap cholecystectomy with IOC today. No evidence for CBD stones on IOC.   -GI will sign out. Call with questions  Mildly elevated liver enzymes < 3 x ULN.  -Liver enzymes continue to improve. AST 47 / ALT 64.    Hepatic cyst.  Size has increased from 1.9 to 2.6 cm on CT scan   Hyperlipidemia -Home statin on hold   Subjective / New Events:  Feels okay. A little post-op discomfort but otherwise okay   Objective:   Recent Labs    07/21/22 2240 07/23/22 0333  WBC 10.7* 7.1  HGB 14.6 13.5  HCT 41.8 39.0  PLT 219 168   BMET Recent Labs    07/21/22 2240 07/23/22 0333  NA 136 137  K 4.4 4.0  CL 101 106  CO2 25 25  GLUCOSE 176* 99  BUN 14 9  CREATININE 1.13 1.08  CALCIUM 8.9 8.6*   LFT Recent Labs    07/22/22 0648 07/23/22 0333  PROT 7.0 6.1*  ALBUMIN 3.8 3.3*  AST 97* 47*  ALT 98* 64*  ALKPHOS 67 60  BILITOT 1.0 1.4*  BILIDIR 0.2  --   IBILI 0.8  --    PT/INR No results for input(s): "LABPROT", "INR" in the last 72 hours.   Imaging:  DG Cholangiogram Operative CLINICAL DATA:  57 year old male with history of pancreatitis and cholelithiasis.  EXAM: INTRAOPERATIVE CHOLANGIOGRAM  TECHNIQUE: Cholangiographic images from the C-arm fluoroscopic device were submitted for interpretation post-operatively. Please see the procedural report for the amount of contrast and the fluoroscopy time utilized.  COMPARISON:  MRI abdomen from 07/22/2022  FINDINGS: Intraoperative antegrade injection via the cystic duct which opacifies  the common bile duct and central portions of the intrahepatic biliary tree. Contrast is visualized flowing freely into the duodenum. There are no filling defects. No significant intra or extrahepatic biliary ductal dilation. No apparent anomalous anatomical configuration of the biliary tree.  IMPRESSION: No evidence of choledocholithiasis.  Marliss Coots, MD  [facility]  [facility]  Electronically Signed   By: Marliss Coots M.D.   On: 07/23/2022 10:39 MR ABDOMEN MRCP W WO CONTAST CLINICAL DATA:  Acute pancreatitis.  EXAM: MRI ABDOMEN WITHOUT AND WITH CONTRAST (INCLUDING MRCP)  TECHNIQUE: Multiplanar multisequence MR imaging of the abdomen was performed both before and after the administration of intravenous contrast. Heavily T2-weighted images of the biliary and pancreatic ducts were obtained, and three-dimensional MRCP images were rendered by post processing.  CONTRAST:  10mL GADAVIST GADOBUTROL 1 MMOL/ML IV SOLN  COMPARISON:  Ultrasound and CT scan, same date.  FINDINGS: Lower chest: The lung bases are clear of an acute process. No pleural or pericardial effusion. Prominent epicardial fat.  Hepatobiliary: Simple 2.5 cm cyst at the hepatic dome. No hepatic lesions or intrahepatic biliary dilatation. Small layering gallstones are noted dependently in the gallbladder. No findings for acute cholecystitis. No pericholecystic inflammatory changes or wall thickening. Normal caliber and course of the common bile duct. No common bile duct stones.  Pancreas: Mild diffuse edema involving the pancreas with  peripancreatic inflammatory changes and a small amount fluid in the anterior pararenal spaces. Findings consistent with diffuse interstitial pancreatitis. Normal pancreatic enhancement. No areas of necrosis. The splenic vein is patent.  Spleen:  Normal  Adrenals/Urinary Tract: The adrenal glands are normal.  Simple nonenhancing 14 mm left renal cyst not requiring any further imaging evaluation or follow-up.  Stomach/Bowel: The stomach, duodenum, visualized small bowel and visualized colon are unremarkable.  Vascular/Lymphatic: The aorta and branch vessels are patent. The major venous structures are patent. Retroaortic left renal vein is noted. No mesenteric or retroperitoneal mass or adenopathy.  Other:  No ascites or abdominal wall hernia.  Musculoskeletal: No significant bony findings.  IMPRESSION: 1. MR findings consistent with diffuse interstitial pancreatitis. No complicating features. 2. Cholelithiasis but no findings for acute cholecystitis. 3. Normal caliber and course of the common bile duct. No common bile duct stones. 4. 2.5 cm cyst at the hepatic dome. 5. 14 mm left renal cyst not requiring any further imaging evaluation or follow-up.  Electronically Signed   By: Rudie Meyer M.D.   On: 07/23/2022 07:48     Scheduled inpatient medications:   enoxaparin (LOVENOX) injection  50 mg Subcutaneous Q24H   fentaNYL       Continuous inpatient infusions:   lactated ringers     PRN inpatient medications: fentaNYL, hydrALAZINE, HYDROcodone-acetaminophen, HYDROmorphone (DILAUDID) injection, ibuprofen, ondansetron **OR** ondansetron (ZOFRAN) IV, senna-docusate  Vital signs in last 24 hours: Temp:  [97.9 F (36.6 C)-98.4 F (36.9 C)] 98.4 F (36.9 C) (05/22 1212) Pulse Rate:  [63-85] 79 (05/22 1212) Resp:  [13-20] 19 (05/22 1200) BP: (116-144)/(68-95) 122/75 (05/22 1212) SpO2:  [93 %-98 %] 98 % (05/22 1212) Weight:  [104.3 kg] 104.3 kg (05/22 0833) Last BM Date : 07/22/22  Intake/Output Summary (Last 24 hours) at 07/23/2022 1221 Last data filed at 07/23/2022 1043 Gross per 24 hour  Intake 1480 ml  Output --  Net 1480 ml    Intake/Output from previous day: 05/21 0701 - 05/22 0700 In: 1480 [P.O.:480; I.V.:1000] Out: -  Intake/Output this shift: Total I/O In:  1000 [I.V.:1000] Out: -    Physical Exam:  General: Alert male in NAD Heart:  Regular rate and rhythm.  Pulmonary: Normal respiratory effort Abdomen: Soft, nondistended, hypoactive bowel sounds. Neurologic: Alert and oriented Psych: Pleasant. Cooperative. Insight appears normal.    Principal Problem:   Gallstone pancreatitis Active Problems:   Mixed hyperlipidemia   Elevated LFTs     LOS: 1 day   Willette Cluster ,NP 07/23/2022, 12:21 PM

## 2022-07-23 NOTE — Discharge Instructions (Signed)
LAPAROSCOPIC SURGERY: POST OP INSTRUCTIONS Always review your discharge instruction sheet given to you by the facility where your surgery was performed. IF YOU HAVE DISABILITY OR FAMILY LEAVE FORMS, YOU MUST BRING THEM TO THE OFFICE FOR PROCESSING.   DO NOT GIVE THEM TO YOUR DOCTOR.  PAIN CONTROL  First take acetaminophen (Tylenol) AND/or ibuprofen (Advil) to control your pain after surgery.  Follow directions on package.  Taking acetaminophen (Tylenol) and/or ibuprofen (Advil) regularly after surgery will help to control your pain and lower the amount of prescription pain medication you may need.  You should not take more than 3,000 mg (3 grams) of acetaminophen (Tylenol) in 24 hours.  You should not take ibuprofen (Advil), aleve, motrin, naprosyn or other NSAIDS if you have a history of stomach ulcers or chronic kidney disease.  A prescription for pain medication may be given to you upon discharge.  Take your pain medication as prescribed, if you still have uncontrolled pain after taking acetaminophen (Tylenol) or ibuprofen (Advil). Use ice packs to help control pain. If you need a refill on your pain medication, please contact your pharmacy.  They will contact our office to request authorization. Prescriptions will not be filled after 5pm or on week-ends.  HOME MEDICATIONS Take your usually prescribed medications unless otherwise directed.  DIET You should follow a light diet the first few days after arrival home.  Be sure to include lots of fluids daily. Avoid fatty, fried foods.   CONSTIPATION It is common to experience some constipation after surgery and if you are taking pain medication.  Increasing fluid intake and taking a stool softener (such as Colace) will usually help or prevent this problem from occurring.  A mild laxative (Milk of Magnesia or Miralax) should be taken according to package instructions if there are no bowel movements after 48 hours.  WOUND/INCISION CARE Most  patients will experience some swelling and bruising in the area of the incisions.  Ice packs will help.  Swelling and bruising can take several days to resolve.  Unless discharge instructions indicate otherwise, follow guidelines below  STERI-STRIPS - you may remove your outer bandages 48 hours after surgery, and you may shower at that time.  You have steri-strips (small skin tapes) in place directly over the incision.  These strips should be left on the skin for 7-10 days.   DERMABOND/SKIN GLUE - you may shower in 24 hours.  The glue will flake off over the next 2-3 weeks. Any sutures or staples will be removed at the office during your follow-up visit.  ACTIVITIES You may resume regular (light) daily activities beginning the next day--such as daily self-care, walking, climbing stairs--gradually increasing activities as tolerated.  You may have sexual intercourse when it is comfortable.  Refrain from any heavy lifting or straining until approved by your doctor. You may drive when you are no longer taking prescription pain medication, you can comfortably wear a seatbelt, and you can safely maneuver your car and apply brakes.  FOLLOW-UP You should see your doctor in the office for a follow-up appointment approximately 2-3 weeks after your surgery.  You should have been given your post-op/follow-up appointment when your surgery was scheduled.  If you did not receive a post-op/follow-up appointment, make sure that you call for this appointment within a day or two after you arrive home to insure a convenient appointment time.   WHEN TO CALL YOUR DOCTOR: Fever over 101.0 Inability to urinate Continued bleeding from incision. Increased pain, redness, or drainage   from the incision. Increasing abdominal pain  The clinic staff is available to answer your questions during regular business hours.  Please don't hesitate to call and ask to speak to one of the nurses for clinical concerns.  If you have a  medical emergency, go to the nearest emergency room or call 911.  A surgeon from [facility] is always on call at the hospital. [address], [address], [place], [place]  [address] ? [address] [address], [place], [place]   [address] [phone] ? [phone] ? FAX [phone] Web site: [email or web address]      Managing Your Pain After Surgery Without Opioids    Thank you for participating in our program to help patients manage their pain after surgery without opioids. This is part of our effort to provide you with the best care possible, without exposing you or your family to the risk that opioids pose.  What pain can I expect after surgery? You can expect to have some pain after surgery. This is normal. The pain is typically worse the day after surgery, and quickly begins to get better. Many studies have found that many patients are able to manage their pain after surgery with Over-the-Counter (OTC) medications such as Tylenol and Motrin. If you have a condition that does not allow you to take Tylenol or Motrin, notify your surgical team.  How will I manage my pain? The best strategy for controlling your pain after surgery is around the clock pain control with Tylenol (acetaminophen) and Motrin (ibuprofen or Advil). Alternating these medications with each other allows you to maximize your pain control. In addition to Tylenol and Motrin, you can use heating pads or ice packs on your incisions to help reduce your pain.  How will I alternate your regular strength over-the-counter pain medication? You will take a dose of pain medication every three hours. Start by taking 650 mg of Tylenol (2 pills of 325 mg) 3 hours later take 600 mg of Motrin (3 pills of 200 mg) 3 hours after taking the Motrin take 650 mg of Tylenol 3 hours after that take 600 mg of Motrin.   - 1 -  See example - if your first dose of Tylenol is at 12:00 PM   12:00 PM Tylenol 650 mg (2  pills of 325 mg)  3:00 PM Motrin 600 mg (3 pills of 200 mg)  6:00 PM Tylenol 650 mg (2 pills of 325 mg)  9:00 PM Motrin 600 mg (3 pills of 200 mg)  Continue alternating every 3 hours   We recommend that you follow this schedule around-the-clock for at least 3 days after surgery, or until you feel that it is no longer needed. Use the table on the last page of this handout to keep track of the medications you are taking. Important: Do not take more than 3000mg of Tylenol or 3200mg of Motrin in a 24-hour period. Do not take ibuprofen/Motrin if you have a history of bleeding stomach ulcers, severe kidney disease, &/or actively taking a blood thinner  What if I still have pain? If you have pain that is not controlled with the over-the-counter pain medications (Tylenol and Motrin or Advil) you might have what we call "breakthrough" pain. You will receive a prescription for a small amount of an opioid pain medication such as Oxycodone, Tramadol, or Tylenol with Codeine. Use these opioid pills in the first 24 hours after surgery if you have breakthrough pain. Do not take more than 1   pill every 4-6 hours.  If you still have uncontrolled pain after using all opioid pills, don't hesitate to call our staff using the number provided. We will help make sure you are managing your pain in the best way possible, and if necessary, we can provide a prescription for additional pain medication.   Day 1    Time  Name of Medication Number of pills taken  Amount of Acetaminophen  Pain Level   Comments  AM PM       AM PM       AM PM       AM PM       AM PM       AM PM       AM PM       AM PM       Total Daily amount of Acetaminophen Do not take more than  3,000 mg per day      Day 2    Time  Name of Medication Number of pills taken  Amount of Acetaminophen  Pain Level   Comments  AM PM       AM PM       AM PM       AM PM       AM PM       AM PM       AM PM       AM PM       Total Daily  amount of Acetaminophen Do not take more than  3,000 mg per day      Day 3    Time  Name of Medication Number of pills taken  Amount of Acetaminophen  Pain Level   Comments  AM PM       AM PM       AM PM       AM PM         AM PM       AM PM       AM PM       AM PM       Total Daily amount of Acetaminophen Do not take more than  3,000 mg per day      Day 4    Time  Name of Medication Number of pills taken  Amount of Acetaminophen  Pain Level   Comments  AM PM       AM PM       AM PM       AM PM       AM PM       AM PM       AM PM       AM PM       Total Daily amount of Acetaminophen Do not take more than  3,000 mg per day      Day 5    Time  Name of Medication Number of pills taken  Amount of Acetaminophen  Pain Level   Comments  AM PM       AM PM       AM PM       AM PM       AM PM       AM PM       AM PM       AM PM       Total Daily amount of Acetaminophen Do not take more than  3,000 mg per day        Day 6    Time  Name of Medication Number of pills taken  Amount of Acetaminophen  Pain Level  Comments  AM PM       AM PM       AM PM       AM PM       AM PM       AM PM       AM PM       AM PM       Total Daily amount of Acetaminophen Do not take more than  3,000 mg per day      Day 7    Time  Name of Medication Number of pills taken  Amount of Acetaminophen  Pain Level   Comments  AM PM       AM PM       AM PM       AM PM       AM PM       AM PM       AM PM       AM PM       Total Daily amount of Acetaminophen Do not take more than  3,000 mg per day        For additional information about how and where to safely dispose of unused opioid medications - https://www.morepowerfulnc.org  Disclaimer: This document contains information and/or instructional materials adapted from [facility] for the typical patient with your condition. It does not replace medical advice from your health care provider because  your experience may differ from that of the typical patient. Talk to your health care provider if you have any questions about this document, your condition or your treatment plan. Adapted from [facility]  

## 2022-07-23 NOTE — Progress Notes (Signed)
PROGRESS NOTE  NYCHOLAS Miles  ZOX:[id number] DOB: October 27, 1965 DOA: 07/21/2022 PCP: Bradd Canary, MD   Brief Narrative: Patient is a 57 male with history of hyperlipidemia, coronary disease who presented with 1 day history of epigastric pain, left upper quadrant pain that worsened with fatty meal.  Lab work on presentation showed elevated lipase.  CT abdomen/pelvis showed acute interstitial pancreatitis without peripancreatic fluid collection.  Right upper quadrant ultrasound showed gallbladder sludge.  MRCP showed cholelithiasis without evidence of CBD stone, with normal caliber.  GI, general surgery were consulted.  Underwent laparoscopic colostomy today  Assessment & Plan:  Principal Problem:   Gallstone pancreatitis Active Problems:   Mixed hyperlipidemia   Elevated LFTs   Biliary pancreatitis: Presented with abdominal pain.  Lipase elevated on presentation.  CT abdomen/pelvis finding as above.  MRI also showed features of pancreatitis with normal CBD caliber, no CBD stone.  GI, general surgery were consulted on admission. Underwent cholecystectomy today. Lipase, Liver enzymes have significantly improved today.Abd pain has resolved,no nausea or vomiting   HLD: Crestor on hold  Obesity: BMI of 34.9       DVT prophylaxis:Lovenox     Code Status: Full Code  Family Communication: None at bedside  Patient status:Inpatient  Patient is from :home  Anticipated discharge WJ:XBJY  Estimated DC date:tomorrow   Consultants: GI,Surgery  Procedures:Lab chole  Antimicrobials:  Anti-infectives (From admission, onward)    Start     Dose/Rate Route Frequency Ordered Stop   07/23/22 0900  ceFAZolin (ANCEF) IVPB 2g/100 mL premix        2 g 200 mL/hr over 30 Minutes Intravenous  Once 07/23/22 0803 07/23/22 0935       Subjective:  Patient seen and examined at the bedside today.  Hemodynamically stable .he just came from surgery and was in PACU.  Complains of some soreness  on the surgical wounds.  No nausea or vomiting Objective: Vitals:   07/23/22 1130 07/23/22 1145 07/23/22 1200 07/23/22 1212  BP: 129/77 116/75 122/71 122/75  Pulse: 79 77 74 79  Resp: 15 18 19    Temp:   98 F (36.7 C) 98.4 F (36.9 C)  TempSrc:    Oral  SpO2: 93% 96% 94% 98%  Weight:      Height:        Intake/Output Summary (Last 24 hours) at 07/23/2022 1300 Last data filed at 07/23/2022 1043 Gross per 24 hour  Intake 1480 ml  Output --  Net 1480 ml   Filed Weights   07/21/22 2231 07/22/22 0522 07/23/22 0833  Weight: 104.3 kg 104.3 kg 104.3 kg    Examination:  General exam: Overall comfortable, not in distress,obese HEENT: PERRL Respiratory system:  no wheezes or crackles  Cardiovascular system: S1 & S2 heard, RRR.  Gastrointestinal system: Abdomen is nondistended, soft and mostly nontender.Lap chole surgical wounds Central nervous system: Alert and oriented Extremities: No edema, no clubbing ,no cyanosis Skin: No rashes, no ulcers,no icterus     Data Reviewed: I have personally reviewed following labs and imaging studies  CBC: Recent Labs  Lab 07/21/22 2240 07/23/22 0333  WBC 10.7* 7.1  HGB 14.6 13.5  HCT 41.8 39.0  MCV 91.7 90.5  PLT 219 168   Basic Metabolic Panel: Recent Labs  Lab 07/21/22 2240 07/23/22 0333  NA 136 137  K 4.4 4.0  CL 101 106  CO2 25 25  GLUCOSE 176* 99  BUN 14 9  CREATININE 1.13 1.08  CALCIUM 8.9 8.6*  No results found for this or any previous visit (from the past 240 hour(s)).   Radiology Studies: DG Cholangiogram Operative  Result Date: 07/23/2022 CLINICAL DATA:  57 year old male with history of pancreatitis and cholelithiasis. EXAM: INTRAOPERATIVE CHOLANGIOGRAM TECHNIQUE: Cholangiographic images from the C-arm fluoroscopic device were submitted for interpretation post-operatively. Please see the procedural report for the amount of contrast and the fluoroscopy time utilized. COMPARISON:  MRI abdomen from 07/22/2022  FINDINGS: Intraoperative antegrade injection via the cystic duct which opacifies the common bile duct and central portions of the intrahepatic biliary tree. Contrast is visualized flowing freely into the duodenum. There are no filling defects. No significant intra or extrahepatic biliary ductal dilation. No apparent anomalous anatomical configuration of the biliary tree. IMPRESSION: No evidence of choledocholithiasis. Marliss Coots, MD [facility] Electronically Signed   By: Marliss Coots M.D.   On: 07/23/2022 10:39   MR ABDOMEN MRCP W WO CONTAST  Result Date: 07/23/2022 CLINICAL DATA:  Acute pancreatitis. EXAM: MRI ABDOMEN WITHOUT AND WITH CONTRAST (INCLUDING MRCP) TECHNIQUE: Multiplanar multisequence MR imaging of the abdomen was performed both before and after the administration of intravenous contrast. Heavily T2-weighted images of the biliary and pancreatic ducts were obtained, and three-dimensional MRCP images were rendered by post processing. CONTRAST:  10mL GADAVIST GADOBUTROL 1 MMOL/ML IV SOLN COMPARISON:  Ultrasound and CT scan, same date. FINDINGS: Lower chest: The lung bases are clear of an acute process. No pleural or pericardial effusion. Prominent epicardial fat. Hepatobiliary: Simple 2.5 cm cyst at the hepatic dome. No hepatic lesions or intrahepatic biliary dilatation. Small layering gallstones are noted dependently in the gallbladder. No findings for acute cholecystitis. No pericholecystic inflammatory changes or wall thickening. Normal caliber and course of the common bile duct. No common bile duct stones. Pancreas: Mild diffuse edema involving the pancreas with peripancreatic inflammatory changes and a small amount fluid in the anterior pararenal spaces. Findings consistent with diffuse interstitial pancreatitis. Normal pancreatic enhancement. No areas of necrosis. The splenic vein is patent. Spleen:  Normal Adrenals/Urinary Tract: The  adrenal glands are normal. Simple nonenhancing 14 mm left renal cyst not requiring any further imaging evaluation or follow-up. Stomach/Bowel: The stomach, duodenum, visualized small bowel and visualized colon are unremarkable. Vascular/Lymphatic: The aorta and branch vessels are patent. The major venous structures are patent. Retroaortic left renal vein is noted. No mesenteric or retroperitoneal mass or adenopathy. Other:  No ascites or abdominal wall hernia. Musculoskeletal: No significant bony findings. IMPRESSION: 1. MR findings consistent with diffuse interstitial pancreatitis. No complicating features. 2. Cholelithiasis but no findings for acute cholecystitis. 3. Normal caliber and course of the common bile duct. No common bile duct stones. 4. 2.5 cm cyst at the hepatic dome. 5. 14 mm left renal cyst not requiring any further imaging evaluation or follow-up. Electronically Signed   By: Rudie Meyer M.D.   On: 07/23/2022 07:48   US Abdomen Limited RUQ (LIVER/GB)  Result Date: 07/22/2022 CLINICAL DATA:  Right upper quadrant abdominal pain EXAM: ULTRASOUND ABDOMEN LIMITED RIGHT UPPER QUADRANT COMPARISON:  CT abdomen/pelvis dated 07/22/2022 FINDINGS: Gallbladder: Layering gallbladder sludge. No gallstones or wall thickening visualized. No sonographic Murphy sign noted by sonographer. Common bile duct: Diameter: 3 mm Liver: Hyperechoic hepatic parenchyma, suggesting hepatic steatosis, with focal fatty sparing along the gallbladder fossa. Portal vein is patent on color Doppler imaging with normal direction of blood flow towards the liver. Other: None. IMPRESSION: Layering gallbladder sludge, without associated inflammatory changes. Hepatic steatosis with focal fatty sparing.  Electronically Signed   By: Charline Bills M.D.   On: 07/22/2022 10:05   CT ABDOMEN PELVIS W CONTRAST  Result Date: 07/22/2022 CLINICAL DATA:  Acute onset left upper quadrant abdominal pain associated with hypertension EXAM: CT  ABDOMEN AND PELVIS WITH CONTRAST TECHNIQUE: Multidetector CT imaging of the abdomen and pelvis was performed using the standard protocol following bolus administration of intravenous contrast. RADIATION DOSE REDUCTION: This exam was performed according to the departmental dose-optimization program which includes automated exposure control, adjustment of the mA and/or kV according to patient size and/or use of iterative reconstruction technique. CONTRAST:  75mL OMNIPAQUE IOHEXOL 350 MG/ML SOLN COMPARISON:  CT abdomen and pelvis dated 08/03/2017 FINDINGS: Lower chest: No focal consolidation or pulmonary nodule in the lung bases. No pleural effusion or pneumothorax demonstrated. Partially imaged heart size is normal. Coronary artery calcification. Hepatobiliary: Increase in size of 2.6 cm segment 4 cyst (3:16), previously 1.9 cm. No intra or extrahepatic biliary ductal dilation. Layering hyperattenuation, likely sludge versus stones. Pancreas: No focal lesions or main ductal dilation. Heterogeneous hypoattenuation in the pancreatic head likely corresponds to uneven lipomatosis. Spleen: Normal in size without focal abnormality. Adrenals/Urinary Tract: No adrenal nodules. No suspicious renal mass, calculi or hydronephrosis. No focal bladder wall thickening. Stomach/Bowel: Normal appearance of the stomach. No evidence of bowel wall thickening, distention, or inflammatory changes. Normal appendix. Vascular/Lymphatic: Aortic atherosclerosis. Retroaortic left renal vein. No enlarged abdominal or pelvic lymph nodes. Reproductive: Prostate is unremarkable. Other: Trace fluid along the left anterior renal fascia. No free air or fluid collection. Musculoskeletal: No acute or abnormal lytic or blastic osseous lesions. Multilevel degenerative changes of the partially imaged thoracic and lumbar spine. IMPRESSION: 1. Trace fluid along the left anterior renal fascia, likely sequela of acute interstitial pancreatitis in the setting of  elevated lipase. No parenchymal necrosis or peripancreatic fluid collection. 2. Layering hyperattenuation in the gallbladder, likely sludge versus stones. 3. Aortic Atherosclerosis (ICD10-I70.0). Coronary artery calcifications. Assessment for potential risk factor modification, dietary therapy or pharmacologic therapy may be warranted, if clinically indicated. Electronically Signed   By: Agustin Cree M.D.   On: 07/22/2022 08:35   DG Chest 2 View  Result Date: 07/21/2022 CLINICAL DATA:  Chest pain. EXAM: CHEST - 2 VIEW COMPARISON:  03/02/2014. FINDINGS: The heart size and mediastinal contours are within normal limits. Mild subsegmental atelectasis is noted at the left lung base. There is elevation of the right diaphragm. No effusion or pneumothorax. Degenerative changes are present in the thoracic spine. No acute osseous abnormality. IMPRESSION: Mild atelectasis at the left lung base. Electronically Signed   By: Thornell Sartorius M.D.   On: 07/21/2022 23:32    Scheduled Meds:  enoxaparin (LOVENOX) injection  50 mg Subcutaneous Q24H   [id number]       Continuous Infusions:  lactated ringers 75 mL/hr at 07/23/22 1230     LOS: 1 day   Burnadette Pop, MD [facility] P5/22/2024, 1:00 PM

## 2022-07-23 NOTE — Anesthesia Preprocedure Evaluation (Addendum)
Anesthesia Evaluation  Patient identified by MRN, date of birth, ID band Patient awake    Reviewed: Allergy & Precautions, NPO status , Patient's Chart, lab work & pertinent test results  History of Anesthesia Complications Negative for: history of anesthetic complications  Airway Mallampati: II  TM Distance: >3 FB Neck ROM: Full    Dental no notable dental hx.    Pulmonary neg pulmonary ROS   Pulmonary exam normal        Cardiovascular + CAD  Normal cardiovascular exam     Neuro/Psych   Anxiety        GI/Hepatic Neg liver ROS,GERD  ,,cholecystitis   Endo/Other  negative endocrine ROS    Renal/GU negative Renal ROS     Musculoskeletal  (+) Arthritis ,    Abdominal   Peds  Hematology negative hematology ROS (+)   Anesthesia Other Findings Day of surgery medications reviewed with patient.  Reproductive/Obstetrics                              Anesthesia Physical Anesthesia Plan  ASA: 2  Anesthesia Plan: General   Post-op Pain Management: Tylenol PO (pre-op)*   Induction: Intravenous  PONV Risk Score and Plan: 3 and Treatment may vary due to age or medical condition, Dexamethasone, Ondansetron and Midazolam  Airway Management Planned: Oral ETT  Additional Equipment: None  Intra-op Plan:   Post-operative Plan: Extubation in OR  Informed Consent: I have reviewed the patients History and Physical, chart, labs and discussed the procedure including the risks, benefits and alternatives for the proposed anesthesia with the patient or authorized representative who has indicated his/her understanding and acceptance.     Dental advisory given  Plan Discussed with: CRNA  Anesthesia Plan Comments:         Anesthesia Quick Evaluation

## 2022-07-23 NOTE — Op Note (Signed)
07/23/2022  10:51 AM  PATIENT:  Casey Miles  57 y.o. male  PRE-OPERATIVE DIAGNOSIS:  GALLSTONE PANCREATITIS  POST-OPERATIVE DIAGNOSIS:  GALLSTONE PANCREATITIS  PROCEDURE:  Procedure(s): LAPAROSCOPIC CHOLECYSTECTOMY WITH INTRAOPERATIVE CHOLANGIOGRAM (N/A)  SURGEON:  Surgeon(s) and Role:    * Griselda Miner, MD - Primary  PHYSICIAN ASSISTANT:   ASSISTANTS: Dr. Diona Browner   ANESTHESIA:   local and general  EBL:  minimal   BLOOD ADMINISTERED:none  DRAINS: none   LOCAL MEDICATIONS USED:  MARCAINE     SPECIMEN:  Source of Specimen:  gallbladder  DISPOSITION OF SPECIMEN:  PATHOLOGY  COUNTS:  YES  TOURNIQUET:  * No tourniquets in log *  DICTATION: .Dragon Dictation    Procedure: After informed consent was obtained the patient was brought to the operating room and placed in the supine position on the operating room table. After adequate induction of general anesthesia the patient's abdomen was prepped with ChloraPrep allowed to dry and draped in usual sterile manner. An appropriate timeout was performed. The area below the umbilicus was infiltrated with quarter percent  Marcaine. A small incision was made with a 15 blade knife. The incision was carried down through the subcutaneous tissue bluntly with a hemostat and [employer] retractors. The linea alba was identified. The linea alba was incised with a 15 blade knife and each side was grasped with Coker clamps. The preperitoneal space was then probed with a hemostat until the peritoneum was opened and access was gained to the abdominal cavity. A 0 Vicryl pursestring stitch was placed in the fascia surrounding the opening. A Hassan cannula was then placed through the opening and anchored in place with the previously placed Vicryl purse string stitch. The abdomen was insufflated with carbon dioxide without difficulty. A laparoscope was inserted through the [facility] cannula in the right upper quadrant was inspected. Next the epigastric region  was infiltrated with % Marcaine. A small incision was made with a 15 blade knife. A 5 mm port was placed bluntly through this incision into the abdominal cavity under direct vision. Next 2 sites were chosen laterally on the right side of the abdomen for placement of 5 mm ports. Each of these areas was infiltrated with quarter percent Marcaine. Small stab incisions were made with a 15 blade knife. 5 mm ports were then placed bluntly through these incisions into the abdominal cavity under direct vision without difficulty. A blunt grasper was placed through the lateralmost 5 mm port and used to grasp the dome of the gallbladder and elevate it anteriorly and superiorly. Another blunt grasper was placed through the other 5 mm port and used to retract the body and neck of the gallbladder. A dissector was placed through the epigastric port and using the electrocautery the peritoneal reflection at the gallbladder neck was opened. Blunt dissection was then carried out in this area until the gallbladder neck-cystic duct junction was readily identified and a good window was created. A single clip was placed on the gallbladder neck. A small  ductotomy was made just below the clip with laparoscopic scissors. A 14-gauge Angiocath was then placed through the anterior abdominal wall under direct vision. A Reddick cholangiogram catheter was then placed through the Angiocath and flushed. The catheter was then placed in the cystic duct and anchored in place with a clip. A cholangiogram was obtained that showed no filling defects, good emptying into the duodenum, and an adequate length on the cystic duct. The anchoring clip and catheters were then removed from the  patient. 3 clips were placed proximally on the cystic duct and the duct was divided between the 2 sets of clips. Posterior to this the cystic artery was identified and again dissected bluntly in a circumferential manner until a good window  was created. 2 clips were placed  proximally and one distally on the artery and the artery was divided between the 2 sets of clips. Next a laparoscopic hook cautery device was used to separate the gallbladder from the liver bed. Prior to completely detaching the gallbladder from the liver bed the liver bed was inspected and several small bleeding points were coagulated with the electrocautery until the area was completely hemostatic. The gallbladder was then detached the rest of it from the liver bed without difficulty. A laparoscopic bag was inserted through the hassan port. The laparoscope was moved to the epigastric port. The gallbladder was placed within the bag and the bag was sealed.  The bag with the gallbladder was then removed with the [facility] cannula through the infraumbilical port without difficulty. The fascial defect was then closed with the previously placed Vicryl pursestring stitch as well as with another figure-of-eight 0 Vicryl stitch. The liver bed was inspected again and found to be hemostatic. The abdomen was irrigated with copious amounts of saline until the effluent was clear. The ports were then removed under direct vision without difficulty and were found to be hemostatic. The gas was allowed to escape. No other abnormalities were noted on general inspection of the abdomen. The skin incisions were all closed with interrupted 4-0 Monocryl subcuticular stitches. Dermabond dressings were applied. The patient tolerated the procedure well. At the end of the case all needle sponge and instrument counts were correct. The patient was then awakened and taken to recovery in stable condition  PLAN OF CARE: Admit for overnight observation  PATIENT DISPOSITION:  PACU - hemodynamically stable.   Delay start of Pharmacological VTE agent (>24hrs) due to surgical blood loss or risk of bleeding: no

## 2022-07-23 NOTE — Anesthesia Procedure Notes (Signed)
Procedure Name: Intubation Date/Time: 07/23/2022 9:38 AM  Performed by: Gwenyth Allegra, CRNAPre-anesthesia Checklist: Patient identified, Emergency Drugs available, Suction available, Patient being monitored and Timeout performed Patient Re-evaluated:Patient Re-evaluated prior to induction Oxygen Delivery Method: Circle system utilized Induction Type: IV induction Ventilation: Mask ventilation without difficulty and Oral airway inserted - appropriate to patient size Laryngoscope Size: Mac and 4 Tube type: Oral Tube size: 8.0 mm Airway Equipment and Method: Stylet Placement Confirmation: ETT inserted through vocal cords under direct vision and breath sounds checked- equal and bilateral Secured at: 23 cm Tube secured with: Tape Dental Injury: Teeth and Oropharynx as per pre-operative assessment

## 2022-07-23 NOTE — Anesthesia Postprocedure Evaluation (Signed)
Anesthesia Post Note  Patient: DEMERICK BUCHHOLTZ  Procedure(s) Performed: LAPAROSCOPIC CHOLECYSTECTOMY WITH INTRAOPERATIVE CHOLANGIOGRAM (Abdomen)     Patient location during evaluation: PACU Anesthesia Type: General Level of consciousness: awake and alert, oriented and patient cooperative Pain management: pain level controlled Vital Signs Assessment: post-procedure vital signs reviewed and stable Respiratory status: spontaneous breathing, nonlabored ventilation and respiratory function stable Cardiovascular status: blood pressure returned to baseline and stable Postop Assessment: no apparent nausea or vomiting Anesthetic complications: no   No notable events documented.  Last Vitals:  Vitals:   07/23/22 1145 07/23/22 1200  BP: 116/75 122/71  Pulse: 77 74  Resp: 18 19  Temp:  36.7 C  SpO2: 96% 94%    Last Pain:  Vitals:   07/23/22 1106  TempSrc:   PainSc: Asleep                 Lannie Fields

## 2022-07-23 NOTE — Progress Notes (Signed)
Progress Note     Subjective: Denies abdominal pain, nausea or vomiting. Discussed plans for OR today and patient agreeable to proceed.   Objective: Vital signs in last 24 hours: Temp:  [97.9 F (36.6 C)-98.9 F (37.2 C)] 97.9 F (36.6 C) (05/22 0603) Pulse Rate:  [63-78] 63 (05/22 0603) Resp:  [11-16] 14 (05/22 0603) BP: (122-139)/(83-95) 122/83 (05/22 0603) SpO2:  [96 %-97 %] 96 % (05/22 0603) Last BM Date : 07/22/22  Intake/Output from previous day: 05/21 0701 - 05/22 0700 In: 1480 [P.O.:480; I.V.:1000] Out: -  Intake/Output this shift: No intake/output data recorded.  PE: General: pleasant, WD, WN male who is laying in bed in NAD HEENT: sclera anicteric  Heart: regular, rate, and rhythm.  Lungs: CTAB, no wheezes, rhonchi, or rales noted.  Respiratory effort nonlabored Abd: soft, NT, mild fullness of epigastric abdomen, +BS, no masses, hernias, or organomegaly MS: all 4 extremities are symmetrical with no cyanosis, clubbing, or edema. Skin: warm and dry with no masses, lesions, or rashes Psych: A&Ox3 with an appropriate affect.   Lab Results:  Recent Labs    07/21/22 2240 07/23/22 0333  WBC 10.7* 7.1  HGB 14.6 13.5  HCT 41.8 39.0  PLT 219 168   BMET Recent Labs    07/21/22 2240 07/23/22 0333  NA 136 137  K 4.4 4.0  CL 101 106  CO2 25 25  GLUCOSE 176* 99  BUN 14 9  CREATININE 1.13 1.08  CALCIUM 8.9 8.6*   PT/INR No results for input(s): "LABPROT", "INR" in the last 72 hours. CMP     Component Value Date/Time   NA 137 07/23/2022 0333   K 4.0 07/23/2022 0333   CL 106 07/23/2022 0333   CO2 25 07/23/2022 0333   GLUCOSE 99 07/23/2022 0333   BUN 9 07/23/2022 0333   CREATININE 1.08 07/23/2022 0333   CREATININE 1.25 10/07/2013 0829   CALCIUM 8.6 (L) 07/23/2022 0333   PROT 6.1 (L) 07/23/2022 0333   ALBUMIN 3.3 (L) 07/23/2022 0333   AST 47 (H) 07/23/2022 0333   ALT 64 (H) 07/23/2022 0333   ALKPHOS 60 07/23/2022 0333   BILITOT 1.4 (H)  07/23/2022 0333   GFRNONAA >60 07/23/2022 0333   GFRAA >60 08/03/2017 0900   Lipase     Component Value Date/Time   LIPASE 90 (H) 07/23/2022 0333       Studies/Results: MR ABDOMEN MRCP W WO CONTAST  Result Date: 07/23/2022 CLINICAL DATA:  Acute pancreatitis. EXAM: MRI ABDOMEN WITHOUT AND WITH CONTRAST (INCLUDING MRCP) TECHNIQUE: Multiplanar multisequence MR imaging of the abdomen was performed both before and after the administration of intravenous contrast. Heavily T2-weighted images of the biliary and pancreatic ducts were obtained, and three-dimensional MRCP images were rendered by post processing. CONTRAST:  10mL GADAVIST GADOBUTROL 1 MMOL/ML IV SOLN COMPARISON:  Ultrasound and CT scan, same date. FINDINGS: Lower chest: The lung bases are clear of an acute process. No pleural or pericardial effusion. Prominent epicardial fat. Hepatobiliary: Simple 2.5 cm cyst at the hepatic dome. No hepatic lesions or intrahepatic biliary dilatation. Small layering gallstones are noted dependently in the gallbladder. No findings for acute cholecystitis. No pericholecystic inflammatory changes or wall thickening. Normal caliber and course of the common bile duct. No common bile duct stones. Pancreas: Mild diffuse edema involving the pancreas with peripancreatic inflammatory changes and a small amount fluid in the anterior pararenal spaces. Findings consistent with diffuse interstitial pancreatitis. Normal pancreatic enhancement. No areas of necrosis. The splenic vein is  patent. Spleen:  Normal Adrenals/Urinary Tract: The adrenal glands are normal. Simple nonenhancing 14 mm left renal cyst not requiring any further imaging evaluation or follow-up. Stomach/Bowel: The stomach, duodenum, visualized small bowel and visualized colon are unremarkable. Vascular/Lymphatic: The aorta and branch vessels are patent. The major venous structures are patent. Retroaortic left renal vein is noted. No mesenteric or retroperitoneal  mass or adenopathy. Other:  No ascites or abdominal wall hernia. Musculoskeletal: No significant bony findings. IMPRESSION: 1. MR findings consistent with diffuse interstitial pancreatitis. No complicating features. 2. Cholelithiasis but no findings for acute cholecystitis. 3. Normal caliber and course of the common bile duct. No common bile duct stones. 4. 2.5 cm cyst at the hepatic dome. 5. 14 mm left renal cyst not requiring any further imaging evaluation or follow-up. Electronically Signed   By: Rudie Meyer M.D.   On: 07/23/2022 07:48   US Abdomen Limited RUQ (LIVER/GB)  Result Date: 07/22/2022 CLINICAL DATA:  Right upper quadrant abdominal pain EXAM: ULTRASOUND ABDOMEN LIMITED RIGHT UPPER QUADRANT COMPARISON:  CT abdomen/pelvis dated 07/22/2022 FINDINGS: Gallbladder: Layering gallbladder sludge. No gallstones or wall thickening visualized. No sonographic Murphy sign noted by sonographer. Common bile duct: Diameter: 3 mm Liver: Hyperechoic hepatic parenchyma, suggesting hepatic steatosis, with focal fatty sparing along the gallbladder fossa. Portal vein is patent on color Doppler imaging with normal direction of blood flow towards the liver. Other: None. IMPRESSION: Layering gallbladder sludge, without associated inflammatory changes. Hepatic steatosis with focal fatty sparing. Electronically Signed   By: Charline Bills M.D.   On: 07/22/2022 10:05   CT ABDOMEN PELVIS W CONTRAST  Result Date: 07/22/2022 CLINICAL DATA:  Acute onset left upper quadrant abdominal pain associated with hypertension EXAM: CT ABDOMEN AND PELVIS WITH CONTRAST TECHNIQUE: Multidetector CT imaging of the abdomen and pelvis was performed using the standard protocol following bolus administration of intravenous contrast. RADIATION DOSE REDUCTION: This exam was performed according to the departmental dose-optimization program which includes automated exposure control, adjustment of the mA and/or kV according to patient size and/or  use of iterative reconstruction technique. CONTRAST:  75mL OMNIPAQUE IOHEXOL 350 MG/ML SOLN COMPARISON:  CT abdomen and pelvis dated 08/03/2017 FINDINGS: Lower chest: No focal consolidation or pulmonary nodule in the lung bases. No pleural effusion or pneumothorax demonstrated. Partially imaged heart size is normal. Coronary artery calcification. Hepatobiliary: Increase in size of 2.6 cm segment 4 cyst (3:16), previously 1.9 cm. No intra or extrahepatic biliary ductal dilation. Layering hyperattenuation, likely sludge versus stones. Pancreas: No focal lesions or main ductal dilation. Heterogeneous hypoattenuation in the pancreatic head likely corresponds to uneven lipomatosis. Spleen: Normal in size without focal abnormality. Adrenals/Urinary Tract: No adrenal nodules. No suspicious renal mass, calculi or hydronephrosis. No focal bladder wall thickening. Stomach/Bowel: Normal appearance of the stomach. No evidence of bowel wall thickening, distention, or inflammatory changes. Normal appendix. Vascular/Lymphatic: Aortic atherosclerosis. Retroaortic left renal vein. No enlarged abdominal or pelvic lymph nodes. Reproductive: Prostate is unremarkable. Other: Trace fluid along the left anterior renal fascia. No free air or fluid collection. Musculoskeletal: No acute or abnormal lytic or blastic osseous lesions. Multilevel degenerative changes of the partially imaged thoracic and lumbar spine. IMPRESSION: 1. Trace fluid along the left anterior renal fascia, likely sequela of acute interstitial pancreatitis in the setting of elevated lipase. No parenchymal necrosis or peripancreatic fluid collection. 2. Layering hyperattenuation in the gallbladder, likely sludge versus stones. 3. Aortic Atherosclerosis (ICD10-I70.0). Coronary artery calcifications. Assessment for potential risk factor modification, dietary therapy or pharmacologic therapy may be  warranted, if clinically indicated. Electronically Signed   By: Agustin Cree M.D.    On: 07/22/2022 08:35   DG Chest 2 View  Result Date: 07/21/2022 CLINICAL DATA:  Chest pain. EXAM: CHEST - 2 VIEW COMPARISON:  03/02/2014. FINDINGS: The heart size and mediastinal contours are within normal limits. Mild subsegmental atelectasis is noted at the left lung base. There is elevation of the right diaphragm. No effusion or pneumothorax. Degenerative changes are present in the thoracic spine. No acute osseous abnormality. IMPRESSION: Mild atelectasis at the left lung base. Electronically Signed   By: Thornell Sartorius M.D.   On: 07/21/2022 23:32    Anti-infectives: Anti-infectives (From admission, onward)    Start     Dose/Rate Route Frequency Ordered Stop   07/23/22 0900  ceFAZolin (ANCEF) IVPB 2g/100 mL premix        2 g 200 mL/hr over 30 Minutes Intravenous  Once 07/23/22 0803          Assessment/Plan  Acute pancreatitis, probably biliary  - CT with pancreatitis but no necrosis or peripancreatic fluid collection, gallbladder sludge vs stones - RUQ [place] with layering sludge, no pericholecystic inflammatory changes - lipase 90 from 793, AST/ALT downtrending, Tbili 1.4 from 1 - MRCP negative for choledocholithiasis - no TTP on exam this AM, planning OR for lap chole possible IOC today, may be able to DC this afternoon pending operative course  - I have explained the procedure, risks, and aftercare of Laparoscopic cholecystectomy  with IOC. Risks include but are not limited to anesthesia (MI, CVA, death, prolonged intubation and aspiration), bleeding, infection, wound problems, hernia, bile leak, injury to common bile duct/liver/intestine, possible need for subtotal cholecystectomy or open cholecystectomy, increased risk of DVT/PE and diarrhea post op. He seems to understand and agrees to proceed.    FEN: NPO VTE: LMWH ID: [id number] pre-op  LOS: 1 day   I reviewed Consultant GI notes, hospitalist notes, last 24 h vitals and pain scores, last 48 h intake and output, last 24 h labs and  trends, and last 24 h imaging results.   Juliet Rude, [facility] Surgery 07/23/2022, 8:04 AM Please see Amion for pager number during day hours 7:00am-4:30pm

## 2022-07-24 ENCOUNTER — Encounter (HOSPITAL_COMMUNITY): Payer: Self-pay | Admitting: General Surgery

## 2022-07-24 DIAGNOSIS — E782 Mixed hyperlipidemia: Secondary | ICD-10-CM | POA: Diagnosis not present

## 2022-07-24 DIAGNOSIS — K851 Biliary acute pancreatitis without necrosis or infection: Secondary | ICD-10-CM | POA: Diagnosis not present

## 2022-07-24 DIAGNOSIS — E669 Obesity, unspecified: Secondary | ICD-10-CM

## 2022-07-24 DIAGNOSIS — E66812 Obesity, class 2: Secondary | ICD-10-CM

## 2022-07-24 LAB — LIPASE, BLOOD: Lipase: 90 U/L — ABNORMAL HIGH (ref 11–51)

## 2022-07-24 MED ORDER — IBUPROFEN 600 MG PO TABS: 600.0000 mg | ORAL_TABLET | Freq: Four times a day (QID) | ORAL | 0 refills | Status: DC | PRN

## 2022-07-24 MED ORDER — HYDROCODONE-ACETAMINOPHEN 5-325 MG PO TABS: 1.0000 | ORAL_TABLET | Freq: Four times a day (QID) | ORAL | 0 refills | Status: DC | PRN

## 2022-07-24 NOTE — Progress Notes (Signed)
Progress Note  1 Day Post-Op  Subjective: Sore as expected but overall pain well controlled. Tolerating diet and passing flatus. Wants to go home today.   Objective: Vital signs in last 24 hours: Temp:  [97.5 F (36.4 C)-98.7 F (37.1 C)] 98.4 F (36.9 C) (05/23 0823) Pulse Rate:  [63-85] 72 (05/23 0823) Resp:  [15-20] 15 (05/23 0519) BP: (116-144)/(68-96) 132/96 (05/23 0823) SpO2:  [93 %-98 %] 97 % (05/23 0823) Weight:  [104.3 kg] 104.3 kg (05/22 0833) Last BM Date : 07/22/22  Intake/Output from previous day: 05/22 0701 - 05/23 0700 In: 1240 [P.O.:240; I.V.:1000] Out: 1 [Urine:1] Intake/Output this shift: No intake/output data recorded.  PE: General: pleasant, WD, WN male who is laying in bed in NAD HEENT: sclera anicteric  Heart: regular, rate, and rhythm.  Lungs: Respiratory effort nonlabored Abd: soft, appropriately ttp, incisions C/D/I, ND, +BS MS: all 4 extremities are symmetrical with no cyanosis, clubbing, or edema. Skin: warm and dry with no masses, lesions, or rashes Psych: A&Ox3 with an appropriate affect.   Lab Results:  Recent Labs    07/21/22 2240 07/23/22 0333  WBC 10.7* 7.1  HGB 14.6 13.5  HCT 41.8 39.0  PLT 219 168    BMET Recent Labs    07/23/22 0333 07/23/22 1326  NA 137 136  K 4.0 4.2  CL 106 103  CO2 25 23  GLUCOSE 99 153*  BUN 9 10  CREATININE 1.08 1.09  CALCIUM 8.6* 8.5*    PT/INR No results for input(s): "LABPROT", "INR" in the last 72 hours. CMP     Component Value Date/Time   NA 136 07/23/2022 1326   K 4.2 07/23/2022 1326   CL 103 07/23/2022 1326   CO2 23 07/23/2022 1326   GLUCOSE 153 (H) 07/23/2022 1326   BUN 10 07/23/2022 1326   CREATININE 1.09 07/23/2022 1326   CREATININE 1.25 10/07/2013 0829   CALCIUM 8.5 (L) 07/23/2022 1326   PROT 6.1 (L) 07/23/2022 1326   ALBUMIN 3.4 (L) 07/23/2022 1326   AST 61 (H) 07/23/2022 1326   ALT 75 (H) 07/23/2022 1326   ALKPHOS 58 07/23/2022 1326   BILITOT 0.8 07/23/2022  1326   GFRNONAA >60 07/23/2022 1326   GFRAA >60 08/03/2017 0900   Lipase     Component Value Date/Time   LIPASE 90 (H) 07/24/2022 0110       Studies/Results: DG Cholangiogram Operative  Result Date: 07/23/2022 CLINICAL DATA:  57 year old male with history of pancreatitis and cholelithiasis. EXAM: INTRAOPERATIVE CHOLANGIOGRAM TECHNIQUE: Cholangiographic images from the C-arm fluoroscopic device were submitted for interpretation post-operatively. Please see the procedural report for the amount of contrast and the fluoroscopy time utilized. COMPARISON:  MRI abdomen from 07/22/2022 FINDINGS: Intraoperative antegrade injection via the cystic duct which opacifies the common bile duct and central portions of the intrahepatic biliary tree. Contrast is visualized flowing freely into the duodenum. There are no filling defects. No significant intra or extrahepatic biliary ductal dilation. No apparent anomalous anatomical configuration of the biliary tree. IMPRESSION: No evidence of choledocholithiasis. Marliss Coots, MD [facility] Radiology Electronically Signed   By: Marliss Coots M.D.   On: 07/23/2022 10:39   MR ABDOMEN MRCP W WO CONTAST  Result Date: 07/23/2022 CLINICAL DATA:  Acute pancreatitis. EXAM: MRI ABDOMEN WITHOUT AND WITH CONTRAST (INCLUDING MRCP) TECHNIQUE: Multiplanar multisequence MR imaging of the abdomen was performed both before and after the administration of intravenous contrast. Heavily T2-weighted images of the biliary and pancreatic ducts  were obtained, and three-dimensional MRCP images were rendered by post processing. CONTRAST:  10mL GADAVIST GADOBUTROL 1 MMOL/ML IV SOLN COMPARISON:  Ultrasound and CT scan, same date. FINDINGS: Lower chest: The lung bases are clear of an acute process. No pleural or pericardial effusion. Prominent epicardial fat. Hepatobiliary: Simple 2.5 cm cyst at the hepatic dome. No hepatic lesions or intrahepatic  biliary dilatation. Small layering gallstones are noted dependently in the gallbladder. No findings for acute cholecystitis. No pericholecystic inflammatory changes or wall thickening. Normal caliber and course of the common bile duct. No common bile duct stones. Pancreas: Mild diffuse edema involving the pancreas with peripancreatic inflammatory changes and a small amount fluid in the anterior pararenal spaces. Findings consistent with diffuse interstitial pancreatitis. Normal pancreatic enhancement. No areas of necrosis. The splenic vein is patent. Spleen:  Normal Adrenals/Urinary Tract: The adrenal glands are normal. Simple nonenhancing 14 mm left renal cyst not requiring any further imaging evaluation or follow-up. Stomach/Bowel: The stomach, duodenum, visualized small bowel and visualized colon are unremarkable. Vascular/Lymphatic: The aorta and branch vessels are patent. The major venous structures are patent. Retroaortic left renal vein is noted. No mesenteric or retroperitoneal mass or adenopathy. Other:  No ascites or abdominal wall hernia. Musculoskeletal: No significant bony findings. IMPRESSION: 1. MR findings consistent with diffuse interstitial pancreatitis. No complicating features. 2. Cholelithiasis but no findings for acute cholecystitis. 3. Normal caliber and course of the common bile duct. No common bile duct stones. 4. 2.5 cm cyst at the hepatic dome. 5. 14 mm left renal cyst not requiring any further imaging evaluation or follow-up. Electronically Signed   By: Rudie Meyer M.D.   On: 07/23/2022 07:48   US Abdomen Limited RUQ (LIVER/GB)  Result Date: 07/22/2022 CLINICAL DATA:  Right upper quadrant abdominal pain EXAM: ULTRASOUND ABDOMEN LIMITED RIGHT UPPER QUADRANT COMPARISON:  CT abdomen/pelvis dated 07/22/2022 FINDINGS: Gallbladder: Layering gallbladder sludge. No gallstones or wall thickening visualized. No sonographic Murphy sign noted by sonographer. Common bile duct: Diameter: 3 mm  Liver: Hyperechoic hepatic parenchyma, suggesting hepatic steatosis, with focal fatty sparing along the gallbladder fossa. Portal vein is patent on color Doppler imaging with normal direction of blood flow towards the liver. Other: None. IMPRESSION: Layering gallbladder sludge, without associated inflammatory changes. Hepatic steatosis with focal fatty sparing. Electronically Signed   By: Charline Bills M.D.   On: 07/22/2022 10:05    Anti-infectives: Anti-infectives (From admission, onward)    Start     Dose/Rate Route Frequency Ordered Stop   07/23/22 0900  ceFAZolin (ANCEF) IVPB 2g/100 mL premix        2 g 200 mL/hr over 30 Minutes Intravenous  Once 07/23/22 0803 07/23/22 0935        Assessment/Plan  Acute pancreatitis, probably biliary  POD1 s/p lap chole - pain well controlled - tolerating diet - clear for DC from surgery standpoint, reviewed post-op care and follow up     FEN: reg diet  VTE: LMWH ID: ancef pre-op  LOS: 2 days     Juliet Rude, [facility] 07/24/2022, 8:25 AM Please see Amion for pager number during day hours 7:00am-4:30pm

## 2022-07-24 NOTE — Assessment & Plan Note (Signed)
Calculated BMI is 34.9 ?

## 2022-07-24 NOTE — Discharge Summary (Signed)
Physician Discharge Summary   Patient: Casey Miles MRN: [id number] DOB: 1965-05-25  Admit date:     07/21/2022  Discharge date: 07/24/22  Discharge Physician: York Ram Zanita Millman   PCP: Bradd Canary, MD   Recommendations at discharge:    Patient will follow up with Dr Abner Greenspan in 7 to 19 days. Continue statin therapy.   Discharge Diagnoses: Principal Problem:   Gallstone pancreatitis Active Problems:   Mixed hyperlipidemia   Class 2 obesity  Resolved Problems:   * No resolved hospital problems. [facility] Course: Mr. Provost was admitted to the hospital with the working diagnosis of biliary pancreatitis.   57 yo male with the past medical history of coronary artery disease, obesity and dyslipidemia who presented with abdominal pain. Reported one day of epigastric and left upper quadrant abdominal pain, radiated to his chest. Because persistent pain he was checked in the fire department where his blood pressure was 220/130 and was referred to the ED for further evaluation. On his initial physical examination in the ED his blood pressure was 132/87, HT 73, RR 19 and 02 saturation 95%, lungs with no wheezing or rales, heart with S1 and S2 present and rhythmic, abdomen with tenderness to palpation at the epigastrium with no rebound or guarding, no lower extremity edema.   Na 136, K 4,4 Cl 101, bicarbonate 25, glucose 175, bun 14 cr 1,13 High sensitive troponin 4 and 3  Lipase 743 AST 97, ALT 98, T bilirubin 1.0 to 1.4  Wbc 10,7 hgb 14.6 plt 219   Chest radiograph with no cardiomegaly, no effusions and no infiltrates.   CT abdomen and pelvis with trace fluid along the left anterior renal fascia. Layering hyper-attenuation in the gallbladder, likely sludge versus stone.   Abdominal US with layering gallbladder sludge, without associated inflammatory changes. Hepatic steatosis.   MRCP with diffuse interstitial pancreatitis. No complicating features. Cholelithiasis but no  findings for acute cholecystitis. Normal caliber and course of the common bile duct. No common bile duct stone.   EKG 82 bpm, left axis deviation, normal intervals, sinus rhythm with no significant ST segment or T wave changes.   Patient was placed on supportive medical therapy.   GI, general surgery were consulted. Underwent laparoscopic colostomy with good toleration.   Assessment and Plan: Class 2 obesity Calculated BMI is 34.9         Consultants: GI and general surgery  Procedures performed: LAPAROSCOPIC CHOLECYSTECTOMY WITH INTRAOPERATIVE CHOLANGIOGRAM (N/A)   Disposition: Home Diet recommendation:  Regular diet DISCHARGE MEDICATION: Allergies as of 07/24/2022   No Known Allergies      Medication List     TAKE these medications    HYDROcodone-acetaminophen 5-325 MG tablet Commonly known as: NORCO/VICODIN Take 1-2 tablets by mouth every 6 (six) hours as needed for moderate pain.   ibuprofen 600 MG tablet Commonly known as: ADVIL Take 1 tablet (600 mg total) by mouth every 6 (six) hours as needed for mild pain. Take with food   rosuvastatin 40 MG tablet Commonly known as: CRESTOR Take 1 tablet (40 mg total) by mouth daily.        Follow-up Information     Maczis, Hedda Slade, [place]. Go on 08/14/2022.   Specialty: General Surgery Why: 3:15 PM. Please arrive 30 min prior to appointment time to check in. Contact information: [address] [facility] [place] [address] [phone]  Discharge Exam: Filed Weights   07/21/22 2231 07/22/22 0522 07/23/22 0833  Weight: 104.3 kg 104.3 kg 104.3 kg   BP (!) 132/96 (BP Location: Left Arm)   Pulse 72   Temp 98.4 F (36.9 C) (Oral)   Resp 15   Ht 5\' 8"  (1.727 m)   Wt 104.3 kg   SpO2 97%   BMI 34.97 kg/m   Patient is feeling better, no nausea or vomiting  Neurology awake and alert ENT with no pallor Cardiovascular with S1 and S2 present and  rhythmic with no gallops, rubs or murmurs No JVD No lower extremity edema Respiratory with no wheezing or rhonchi Abdomen not distended No lower extremity edema   Condition at discharge: stable  The results of significant diagnostics from this hospitalization (including imaging, microbiology, ancillary and laboratory) are listed below for reference.   Imaging Studies: DG Cholangiogram Operative  Result Date: 07/23/2022 CLINICAL DATA:  57 year old male with history of pancreatitis and cholelithiasis. EXAM: INTRAOPERATIVE CHOLANGIOGRAM TECHNIQUE: Cholangiographic images from the C-arm fluoroscopic device were submitted for interpretation post-operatively. Please see the procedural report for the amount of contrast and the fluoroscopy time utilized. COMPARISON:  MRI abdomen from 07/22/2022 FINDINGS: Intraoperative antegrade injection via the cystic duct which opacifies the common bile duct and central portions of the intrahepatic biliary tree. Contrast is visualized flowing freely into the duodenum. There are no filling defects. No significant intra or extrahepatic biliary ductal dilation. No apparent anomalous anatomical configuration of the biliary tree. IMPRESSION: No evidence of choledocholithiasis. Marliss Coots, MD [facility] Radiology Electronically Signed   By: Marliss Coots M.D.   On: 07/23/2022 10:39   MR ABDOMEN MRCP W WO CONTAST  Result Date: 07/23/2022 CLINICAL DATA:  Acute pancreatitis. EXAM: MRI ABDOMEN WITHOUT AND WITH CONTRAST (INCLUDING MRCP) TECHNIQUE: Multiplanar multisequence MR imaging of the abdomen was performed both before and after the administration of intravenous contrast. Heavily T2-weighted images of the biliary and pancreatic ducts were obtained, and three-dimensional MRCP images were rendered by post processing. CONTRAST:  10mL GADAVIST GADOBUTROL 1 MMOL/ML IV SOLN COMPARISON:  Ultrasound and CT scan, same date. FINDINGS:  Lower chest: The lung bases are clear of an acute process. No pleural or pericardial effusion. Prominent epicardial fat. Hepatobiliary: Simple 2.5 cm cyst at the hepatic dome. No hepatic lesions or intrahepatic biliary dilatation. Small layering gallstones are noted dependently in the gallbladder. No findings for acute cholecystitis. No pericholecystic inflammatory changes or wall thickening. Normal caliber and course of the common bile duct. No common bile duct stones. Pancreas: Mild diffuse edema involving the pancreas with peripancreatic inflammatory changes and a small amount fluid in the anterior pararenal spaces. Findings consistent with diffuse interstitial pancreatitis. Normal pancreatic enhancement. No areas of necrosis. The splenic vein is patent. Spleen:  Normal Adrenals/Urinary Tract: The adrenal glands are normal. Simple nonenhancing 14 mm left renal cyst not requiring any further imaging evaluation or follow-up. Stomach/Bowel: The stomach, duodenum, visualized small bowel and visualized colon are unremarkable. Vascular/Lymphatic: The aorta and branch vessels are patent. The major venous structures are patent. Retroaortic left renal vein is noted. No mesenteric or retroperitoneal mass or adenopathy. Other:  No ascites or abdominal wall hernia. Musculoskeletal: No significant bony findings. IMPRESSION: 1. MR findings consistent with diffuse interstitial pancreatitis. No complicating features. 2. Cholelithiasis but no findings for acute cholecystitis. 3. Normal caliber and course of the common bile duct. No common bile duct stones. 4. 2.5 cm cyst at the hepatic dome. 5. 14  mm left renal cyst not requiring any further imaging evaluation or follow-up. Electronically Signed   By: Rudie Meyer M.D.   On: 07/23/2022 07:48   US Abdomen Limited RUQ (LIVER/GB)  Result Date: 07/22/2022 CLINICAL DATA:  Right upper quadrant abdominal pain EXAM: ULTRASOUND ABDOMEN LIMITED RIGHT UPPER QUADRANT COMPARISON:  CT  abdomen/pelvis dated 07/22/2022 FINDINGS: Gallbladder: Layering gallbladder sludge. No gallstones or wall thickening visualized. No sonographic Murphy sign noted by sonographer. Common bile duct: Diameter: 3 mm Liver: Hyperechoic hepatic parenchyma, suggesting hepatic steatosis, with focal fatty sparing along the gallbladder fossa. Portal vein is patent on color Doppler imaging with normal direction of blood flow towards the liver. Other: None. IMPRESSION: Layering gallbladder sludge, without associated inflammatory changes. Hepatic steatosis with focal fatty sparing. Electronically Signed   By: Charline Bills M.D.   On: 07/22/2022 10:05   CT ABDOMEN PELVIS W CONTRAST  Result Date: 07/22/2022 CLINICAL DATA:  Acute onset left upper quadrant abdominal pain associated with hypertension EXAM: CT ABDOMEN AND PELVIS WITH CONTRAST TECHNIQUE: Multidetector CT imaging of the abdomen and pelvis was performed using the standard protocol following bolus administration of intravenous contrast. RADIATION DOSE REDUCTION: This exam was performed according to the departmental dose-optimization program which includes automated exposure control, adjustment of the mA and/or kV according to patient size and/or use of iterative reconstruction technique. CONTRAST:  75mL OMNIPAQUE IOHEXOL 350 MG/ML SOLN COMPARISON:  CT abdomen and pelvis dated 08/03/2017 FINDINGS: Lower chest: No focal consolidation or pulmonary nodule in the lung bases. No pleural effusion or pneumothorax demonstrated. Partially imaged heart size is normal. Coronary artery calcification. Hepatobiliary: Increase in size of 2.6 cm segment 4 cyst (3:16), previously 1.9 cm. No intra or extrahepatic biliary ductal dilation. Layering hyperattenuation, likely sludge versus stones. Pancreas: No focal lesions or main ductal dilation. Heterogeneous hypoattenuation in the pancreatic head likely corresponds to uneven lipomatosis. Spleen: Normal in size without focal  abnormality. Adrenals/Urinary Tract: No adrenal nodules. No suspicious renal mass, calculi or hydronephrosis. No focal bladder wall thickening. Stomach/Bowel: Normal appearance of the stomach. No evidence of bowel wall thickening, distention, or inflammatory changes. Normal appendix. Vascular/Lymphatic: Aortic atherosclerosis. Retroaortic left renal vein. No enlarged abdominal or pelvic lymph nodes. Reproductive: Prostate is unremarkable. Other: Trace fluid along the left anterior renal fascia. No free air or fluid collection. Musculoskeletal: No acute or abnormal lytic or blastic osseous lesions. Multilevel degenerative changes of the partially imaged thoracic and lumbar spine. IMPRESSION: 1. Trace fluid along the left anterior renal fascia, likely sequela of acute interstitial pancreatitis in the setting of elevated lipase. No parenchymal necrosis or peripancreatic fluid collection. 2. Layering hyperattenuation in the gallbladder, likely sludge versus stones. 3. Aortic Atherosclerosis (ICD10-I70.0). Coronary artery calcifications. Assessment for potential risk factor modification, dietary therapy or pharmacologic therapy may be warranted, if clinically indicated. Electronically Signed   By: Agustin Cree M.D.   On: 07/22/2022 08:35   DG Chest 2 View  Result Date: 07/21/2022 CLINICAL DATA:  Chest pain. EXAM: CHEST - 2 VIEW COMPARISON:  03/02/2014. FINDINGS: The heart size and mediastinal contours are within normal limits. Mild subsegmental atelectasis is noted at the left lung base. There is elevation of the right diaphragm. No effusion or pneumothorax. Degenerative changes are present in the thoracic spine. No acute osseous abnormality. IMPRESSION: Mild atelectasis at the left lung base. Electronically Signed   By: Thornell Sartorius M.D.   On: 07/21/2022 23:32    Microbiology: Results for orders placed or performed in visit on 05/19/19  SARS  Coronavirus 2 (TAT 6-24 hrs)     Status: None   Collection Time:  05/19/19 12:00 AM  Result Value Ref Range Status   SARS Coronavirus 2 RESULT: NEGATIVE  Final    Comment: RESULT: NEGATIVESARS-CoV-2 INTERPRETATION:A NEGATIVE  test result means that SARS-CoV-2 RNA was not present in the specimen above the limit of detection of this test. This does not preclude a possible SARS-CoV-2 infection and should not be used as the  sole basis for patient management decisions. Negative results must be combined with clinical observations, patient history, and epidemiological information. Optimum specimen types and timing for peak viral levels during infections caused by SARS-CoV-2  have not been determined. Collection of multiple specimens or types of specimens may be necessary to detect virus. Improper specimen collection and handling, sequence variability under primers/probes, or organism present below the limit of detection may  lead to false negative results. Positive and negative predictive values of testing are highly dependent on prevalence. False negative test results are more likely when prevalence of disease is high.The expected result is NEGATIVE.Fact S heet for  Healthcare Providers: [employer] Sheet for Patients: https://poole-freeman.org/ Reference Range - Negative     Labs: CBC: Recent Labs  Lab 07/21/22 2240 07/23/22 0333  WBC 10.7* 7.1  HGB 14.6 13.5  HCT 41.8 39.0  MCV 91.7 90.5  PLT 219 168   Basic Metabolic Panel: Recent Labs  Lab 07/21/22 2240 07/23/22 0333 07/23/22 1326  NA 136 137 136  K 4.4 4.0 4.2  CL 101 106 103  CO2 25 25 23   GLUCOSE 176* 99 153*  BUN 14 9 10   CREATININE 1.13 1.08 1.09  CALCIUM 8.9 8.6* 8.5*   Liver Function Tests: Recent Labs  Lab 07/22/22 0648 07/23/22 0333 07/23/22 1326  AST 97* 47* 61*  ALT 98* 64* 75*  ALKPHOS 67 60 58  BILITOT 1.0 1.4* 0.8  PROT 7.0 6.1* 6.1*  ALBUMIN 3.8 3.3* 3.4*   CBG: No results for input(s): "GLUCAP" in the last 168  hours.  Discharge time spent: greater than 30 minutes.  Signed: Coralie Keens, MD [facility] 07/24/2022

## 2022-07-24 NOTE — TOC Transition Note (Signed)
Transition of Care [facility]) - CM/SW Discharge Note   Patient Details  Name: Casey Miles MRN: [id number] Date of Birth: [birth year]  Transition of Care [facility]) CM/SW Contact:  Harriet Masson, RN Phone Number: 07/24/2022, 12:02 PM   Clinical Narrative:     Patient stable for discharge.  No TOC needs at this time.  Final next level of care: Home/Self Care Barriers to Discharge: Barriers Resolved   Patient Goals and CMS Choice      Discharge Placement                         Discharge Plan and Services Additional resources added to the After Visit Summary for                                       Social Determinants of Health (SDOH) Interventions SDOH Screenings   Food Insecurity: No Food Insecurity (07/22/2022)  Housing: Low Risk  (07/22/2022)  Transportation Needs: No Transportation Needs (07/22/2022)  Utilities: Not At Risk (07/22/2022)  Depression (PHQ2-9): Low Risk  (04/05/2020)  Tobacco Use: Low Risk  (07/24/2022)     Readmission Risk Interventions    07/24/2022   12:01 PM  Readmission Risk Prevention Plan  Post Dischage Appt Not Complete  Appt Comments follow up with surgery 08/14/22  Medication Screening Complete  Transportation Screening Complete

## 2022-07-24 NOTE — Hospital Course (Addendum)
Casey Miles was admitted to the hospital with the working diagnosis of biliary pancreatitis.   57 yo male with the past medical history of coronary artery disease, obesity and dyslipidemia who presented with abdominal pain. Reported one day of epigastric and left upper quadrant abdominal pain, radiated to his chest. Because persistent pain he was checked in the fire department where his blood pressure was 220/130 and was referred to the ED for further evaluation. On his initial physical examination in the ED his blood pressure was 132/87, HT 73, RR 19 and 02 saturation 95%, lungs with no wheezing or rales, heart with S1 and S2 present and rhythmic, abdomen with tenderness to palpation at the epigastrium with no rebound or guarding, no lower extremity edema.   Na 136, K 4,4 Cl 101, bicarbonate 25, glucose 175, bun 14 cr 1,13 High sensitive troponin 4 and 3  Lipase 743 AST 97, ALT 98, T bilirubin 1.0 to 1.4  Wbc 10,7 hgb 14.6 plt 219   Chest radiograph with no cardiomegaly, no effusions and no infiltrates.   CT abdomen and pelvis with trace fluid along the left anterior renal fascia. Layering hyper-attenuation in the gallbladder, likely sludge versus stone.   Abdominal US with layering gallbladder sludge, without associated inflammatory changes. Hepatic steatosis.   MRCP with diffuse interstitial pancreatitis. No complicating features. Cholelithiasis but no findings for acute cholecystitis. Normal caliber and course of the common bile duct. No common bile duct stone.   EKG 82 bpm, left axis deviation, normal intervals, sinus rhythm with no significant ST segment or T wave changes.   Patient was placed on supportive medical therapy.   GI, general surgery were consulted. Underwent laparoscopic colostomy with good toleration.

## 2022-07-25 LAB — SURGICAL PATHOLOGY

## 2022-07-28 ENCOUNTER — Telehealth: Payer: Self-pay

## 2022-07-28 NOTE — Transitions of Care (Post Inpatient/ED Visit) (Signed)
   07/28/2022  Name: Casey Miles MRN: [id number] DOB: 1965-07-14  Today's TOC FU Call Status:    Transition Care Management Follow-up Telephone Call Date of Discharge: 07/24/22 Discharge Facility: [facility]) Type of Discharge: Inpatient Admission Primary Inpatient Discharge Diagnosis:: chest pain How have you been since you were released from the hospital?: Better Any questions or concerns?: No  Items Reviewed: Did you receive and understand the discharge instructions provided?: Yes Medications obtained,verified, and reconciled?: Yes (Medications Reviewed) Any new allergies since your discharge?: No Dietary orders reviewed?: Yes Do you have support at home?: Yes People in Home: spouse  Medications Reviewed Today: Medications Reviewed Today     Reviewed by Karena Addison, LPN (Licensed Practical Nurse) on 07/28/22 at 1128  Med List Status: <None>   Medication Order Taking? Sig Documenting Provider Last Dose Status Informant  HYDROcodone-acetaminophen (NORCO/VICODIN) 5-325 MG tablet [id number] Yes Take 1-2 tablets by mouth every 6 (six) hours as needed for moderate pain. Juliet Rude, PA-C Taking Active   ibuprofen (ADVIL) 600 MG tablet [id number] Yes Take 1 tablet (600 mg total) by mouth every 6 (six) hours as needed for mild pain. Take with food Juliet Rude, PA-C Taking Active   rosuvastatin (CRESTOR) 40 MG tablet [id number] Yes Take 1 tablet (40 mg total) by mouth daily. Jake Bathe, MD Taking Active Self            Home Care and Equipment/Supplies: Were Home Health Services Ordered?: NA Any new equipment or medical supplies ordered?: NA  Functional Questionnaire: Do you need assistance with bathing/showering or dressing?: No Do you need assistance with meal preparation?: No Do you need assistance with eating?: No Do you have difficulty maintaining continence: No Do you need assistance with getting out of bed/getting out of a chair/moving?: No Do you  have difficulty managing or taking your medications?: No  Follow up appointments reviewed: PCP Follow-up appointment confirmed?: No (no avail appt times. sent message to staff to schedule) MD Provider Line [phone] Given: No Specialist Hospital Follow-up appointment confirmed?: NA Do you need transportation to your follow-up appointment?: No Do you understand care options if your condition(s) worsen?: Yes-patient verbalized understanding    SIGNATURE Karena Addison, LPN [facility] Nurse Health Advisor Direct Dial [phone]

## 2022-07-30 ENCOUNTER — Telehealth: Payer: Self-pay | Admitting: Family Medicine

## 2022-07-30 NOTE — Telephone Encounter (Signed)
Pt called back to schedule hospital follow up but pt said he has surgical follow up appt in 2 weeks and his physical with Abner Greenspan in July and he doesn't see the need to come in to be told all is good when he is already going to [place]. Please call to advise if he has to come in still or if he can wait until his physical to see Abner Greenspan.

## 2022-07-30 NOTE — Telephone Encounter (Signed)
Spoke to patient, he will call back to schedule HFU.

## 2022-07-31 ENCOUNTER — Encounter: Payer: Self-pay | Admitting: Gastroenterology

## 2022-07-31 NOTE — Telephone Encounter (Signed)
Sent pt a message.

## 2022-09-08 NOTE — Assessment & Plan Note (Signed)
hgba1c acceptable, minimize simple carbs. Increase exercise as tolerated.  

## 2022-09-08 NOTE — Progress Notes (Unsigned)
Subjective:    Patient ID: Casey Miles, male    DOB: [birth year], 57 y.o.   MRN: [id number]  No chief complaint on file.   HPI Discussed the use of AI scribe software for clinical note transcription with the patient, who gave verbal consent to proceed.  History of Present Illness         Patient is a 57 yo male in today for annual preventative exam and for follow up on chronic medical concerns. No recent febrile illness or hospitalizations. Denies CP/palp/SOB/HA/congestion/fevers/GI or GU c/o. Taking meds as prescribed. 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   Elevated BP 10/08/2011   Elevated LFTs 06/19/2016   Fatigue 01/08/2011   GERD (gastroesophageal reflux disease)    Hyperglycemia 06/17/2016   Hyperlipidemia 10/08/2010   Kidney stone    NECK PAIN 09/26/2009   Neuromuscular disorder (HCC)    C4C5 herniated disc    Other acute reactions to stress 09/26/2009   Other and unspecified hyperlipidemia 01/23/2013   PARESTHESIA 11/22/2009   Preventative health care 08/01/2012   SHOULDER PAIN, LEFT 07/17/2008   Testosterone deficiency 07/09/2011   Vitamin D deficiency 07/08/2011    Past Surgical History:  Procedure Laterality Date   CARDIAC CATHETERIZATION     2013-2014   CHOLECYSTECTOMY N/A 07/23/2022   Procedure: LAPAROSCOPIC CHOLECYSTECTOMY WITH INTRAOPERATIVE CHOLANGIOGRAM;  Surgeon: Griselda Miner, MD;  Location: [facility] OR;  Service: General;  Laterality: N/A;   UMBILICAL HERNIA REPAIR  2017   UPPER GASTROINTESTINAL ENDOSCOPY      Family History  Problem Relation Age of Onset   Diabetes Mother    Hypertension Mother     Hyperlipidemia Mother    Diabetes Maternal Grandmother    Heart disease Maternal Grandmother    Colitis Maternal Grandmother    Diabetes Paternal Grandmother    Colitis Maternal Grandfather    Heart disease Maternal Grandfather    Heart attack Father 9   Lupus Cousin    Irritable bowel syndrome Other    Cancer Other        Esophageal- Great Grandfather   Stomach cancer Other        Uncle   Colon polyps Other        Grandmother   Diabetes Other        Family history of   Hypertension Other        Family history of   Heart attack Other        Family history of   Irritable bowel syndrome Other        Family history of   Colon cancer Neg Hx    Esophageal cancer Neg Hx    Rectal cancer Neg Hx     Social History   Socioeconomic History   Marital status: Married    Spouse name: Not on file   Number of children: Not on file   Years of education: Not on file   Highest education level: Not on file  Occupational History   Not on file  Tobacco Use   Smoking status: Never   Smokeless tobacco: Never  Vaping Use   Vaping Use: Never used  Substance and Sexual Activity   Alcohol use: Yes    Alcohol/week: 1.0 - 2.0 standard drink of alcohol    Types: 1 - 2 Standard drinks or equivalent per week    Comment: 1-2 per week   Drug use: No   Sexual activity: Yes    Comment: lives wife and children, exercises intermittently, heart healthy diet.  Other Topics Concern   Not on file  Social History Narrative   Not on file   Social Determinants of Health   Financial Resource Strain: Not on file  Food Insecurity: No Food Insecurity (07/22/2022)   Hunger Vital Sign    Worried About Running Out of Food in the Last Year: Never true    Ran Out of Food in the Last Year: Never true  Transportation Needs: No Transportation Needs (07/22/2022)   PRAPARE - Administrator, Civil Service (Medical): No    Lack of Transportation (Non-Medical): No  Physical Activity: Not on file   Stress: Not on file  Social Connections: Not on file  Intimate Partner Violence: Not At Risk (07/22/2022)   Humiliation, Afraid, Rape, and Kick questionnaire    Fear of Current or Ex-Partner: No    Emotionally Abused: No    Physically Abused: No    Sexually Abused: No    Outpatient Medications Prior to Visit  Medication Sig Dispense Refill   HYDROcodone-acetaminophen (NORCO/VICODIN) 5-325 MG tablet Take 1-2 tablets by mouth every 6 (six) hours as needed for moderate pain. 15 tablet 0   ibuprofen (ADVIL) 600 MG tablet Take 1 tablet (600 mg total) by mouth every 6 (six) hours as needed for mild pain. Take with food 30 tablet 0   rosuvastatin (CRESTOR) 40 MG tablet Take 1 tablet (40 mg total) by mouth daily. 90 tablet 3   No facility-administered medications prior to visit.    No Known Allergies  Review of Systems  Constitutional:  Negative for chills, fever and malaise/fatigue.  HENT:  Negative for congestion and hearing loss.   Eyes:  Negative for discharge.  Respiratory:  Negative for cough, sputum production and shortness of breath.   Cardiovascular:  Negative for chest pain, palpitations and leg swelling.  Gastrointestinal:  Negative for abdominal pain, blood in stool, constipation, diarrhea, heartburn, nausea and vomiting.  Genitourinary:  Negative for dysuria, frequency, hematuria and urgency.  Musculoskeletal:  Negative for back pain, falls and myalgias.  Skin:  Negative for rash.  Neurological:  Negative for dizziness, sensory change, loss of consciousness, weakness and headaches.  Endo/Heme/Allergies:  Negative for environmental allergies. Does not bruise/bleed easily.  Psychiatric/Behavioral:  Negative for depression and suicidal ideas. The patient is not nervous/anxious and does not have insomnia.        Objective:    Physical Exam Vitals reviewed.  Constitutional:      General: He is not in acute distress.    Appearance: Normal appearance. He is not ill-appearing  or diaphoretic.  HENT:     Head: Normocephalic and atraumatic.     Right Ear: Tympanic membrane, ear canal and external ear normal. There is no impacted cerumen.     Left Ear: Tympanic membrane, ear canal and external ear normal. There is no impacted cerumen.     Nose: Nose normal. No rhinorrhea.     Mouth/Throat:     Pharynx: Oropharynx is clear.  Eyes:     General: No scleral icterus.    Extraocular Movements: Extraocular  movements intact.     Conjunctiva/sclera: Conjunctivae normal.     Pupils: Pupils are equal, round, and reactive to light.  Neck:     Thyroid: No thyroid mass or thyroid tenderness.  Cardiovascular:     Rate and Rhythm: Normal rate and regular rhythm.     Pulses: Normal pulses.     Heart sounds: Normal heart sounds. No murmur heard. Pulmonary:     Effort: Pulmonary effort is normal.     Breath sounds: Normal breath sounds. No wheezing.  Abdominal:     General: Bowel sounds are normal.     Palpations: Abdomen is soft. There is no mass.     Tenderness: There is no guarding.  Musculoskeletal:        General: No swelling. Normal range of motion.     Cervical back: Normal range of motion and neck supple. No rigidity.     Right lower leg: No edema.     Left lower leg: No edema.  Lymphadenopathy:     Cervical: No cervical adenopathy.  Skin:    General: Skin is warm and dry.     Findings: No rash.  Neurological:     General: No focal deficit present.     Mental Status: He is alert and oriented to person, place, and time.     Cranial Nerves: No cranial nerve deficit.     Deep Tendon Reflexes: Reflexes normal.  Psychiatric:        Mood and Affect: Mood normal.        Behavior: Behavior normal.     There were no vitals taken for this visit. Wt Readings from Last 3 Encounters:  07/23/22 230 lb (104.3 kg)  07/07/22 230 lb (104.3 kg)  06/16/22 230 lb (104.3 kg)    Diabetic Foot Exam - Simple   No data filed    Lab Results  Component Value Date   WBC  7.1 07/23/2022   HGB 13.5 07/23/2022   HCT 39.0 07/23/2022   PLT 168 07/23/2022   GLUCOSE 153 (H) 07/23/2022   CHOL 119 07/22/2022   TRIG 152 (H) 07/22/2022   HDL 32 (L) 07/22/2022   LDLDIRECT 88.0 01/06/2019   LDLCALC 57 07/22/2022   ALT 75 (H) 07/23/2022   AST 61 (H) 07/23/2022   NA 136 07/23/2022   K 4.2 07/23/2022   CL 103 07/23/2022   CREATININE 1.09 07/23/2022   BUN 10 07/23/2022   CO2 23 07/23/2022   TSH 1.14 04/05/2020   PSA 0.19 04/05/2020   INR 0.96 03/12/2010   HGBA1C 5.7 04/05/2020    Lab Results  Component Value Date   TSH 1.14 04/05/2020   Lab Results  Component Value Date   WBC 7.1 07/23/2022   HGB 13.5 07/23/2022   HCT 39.0 07/23/2022   MCV 90.5 07/23/2022   PLT 168 07/23/2022   Lab Results  Component Value Date   NA 136 07/23/2022   K 4.2 07/23/2022   CO2 23 07/23/2022   GLUCOSE 153 (H) 07/23/2022   BUN 10 07/23/2022   CREATININE 1.09 07/23/2022   BILITOT 0.8 07/23/2022   ALKPHOS 58 07/23/2022   AST 61 (H) 07/23/2022   ALT 75 (H) 07/23/2022   PROT 6.1 (L) 07/23/2022   ALBUMIN 3.4 (L) 07/23/2022   CALCIUM 8.5 (L) 07/23/2022   ANIONGAP 10 07/23/2022   GFR 70.14 04/05/2020   Lab Results  Component Value Date   CHOL 119 07/22/2022   Lab Results  Component Value Date  HDL 32 (L) 07/22/2022   Lab Results  Component Value Date   LDLCALC 57 07/22/2022   Lab Results  Component Value Date   TRIG 152 (H) 07/22/2022   Lab Results  Component Value Date   CHOLHDL 3.7 07/22/2022   Lab Results  Component Value Date   HGBA1C 5.7 04/05/2020       Assessment & Plan:  Vitamin D deficiency Assessment & Plan: Supplement and monitor   Mixed hyperlipidemia Assessment & Plan: Encouraged heart healthy diet, increase exercise, avoid trans fats, consider a krill oil cap daily   Preventative health care Assessment & Plan: Patient encouraged to maintain heart healthy diet, regular exercise, adequate sleep. Consider daily probiotics.  Take medications as prescribed. Labs reviewed and ordered. Colonoscopy in 05/22/19, repeat in 24.   Hyperglycemia Assessment & Plan: hgba1c acceptable, minimize simple carbs. Increase exercise as tolerated.    Hypocalcemia Assessment & Plan: Supplement and monitor      Assessment and Plan              Danise Edge, MD

## 2022-09-08 NOTE — Assessment & Plan Note (Signed)
Supplement and monitor 

## 2022-09-08 NOTE — Assessment & Plan Note (Addendum)
Patient encouraged to maintain heart healthy diet, regular exercise, adequate sleep. Consider daily probiotics. Take medications as prescribed. Labs reviewed and ordered. Colonoscopy in 05/22/19, repeat in 24.

## 2022-09-08 NOTE — Assessment & Plan Note (Signed)
Encouraged heart healthy diet, increase exercise, avoid trans fats, consider a krill oil cap daily 

## 2022-09-09 ENCOUNTER — Ambulatory Visit (INDEPENDENT_AMBULATORY_CARE_PROVIDER_SITE_OTHER): Payer: No Typology Code available for payment source | Admitting: Family Medicine

## 2022-09-09 ENCOUNTER — Encounter: Payer: Self-pay | Admitting: Family Medicine

## 2022-09-09 VITALS — BP 128/80 | HR 85 | Temp 97.5°F | Resp 16 | Ht 68.0 in | Wt 231.0 lb

## 2022-09-09 DIAGNOSIS — R739 Hyperglycemia, unspecified: Secondary | ICD-10-CM | POA: Diagnosis not present

## 2022-09-09 DIAGNOSIS — K851 Biliary acute pancreatitis without necrosis or infection: Secondary | ICD-10-CM

## 2022-09-09 DIAGNOSIS — Z23 Encounter for immunization: Secondary | ICD-10-CM | POA: Diagnosis not present

## 2022-09-09 DIAGNOSIS — M549 Dorsalgia, unspecified: Secondary | ICD-10-CM

## 2022-09-09 DIAGNOSIS — K861 Other chronic pancreatitis: Secondary | ICD-10-CM

## 2022-09-09 DIAGNOSIS — E559 Vitamin D deficiency, unspecified: Secondary | ICD-10-CM

## 2022-09-09 DIAGNOSIS — E782 Mixed hyperlipidemia: Secondary | ICD-10-CM

## 2022-09-09 DIAGNOSIS — Z0000 Encounter for general adult medical examination without abnormal findings: Secondary | ICD-10-CM

## 2022-09-09 DIAGNOSIS — M5030 Other cervical disc degeneration, unspecified cervical region: Secondary | ICD-10-CM

## 2022-09-09 NOTE — Patient Instructions (Addendum)
Shingrix is the new shingles shot, 2 shots over 2-6 months, confirm coverage with insurance and document, then can return here for shots with nurse appt or at pharmacy   Preventive Care 40-55 Years Old, Male Preventive care refers to lifestyle choices and visits with your health care provider that can promote health and wellness. Preventive care visits are also called wellness exams. What can I expect for my preventive care visit? Counseling During your preventive care visit, your health care provider may ask about your: Medical history, including: Past medical problems. Family medical history. Current health, including: Emotional well-being. Home life and relationship well-being. Sexual activity. Lifestyle, including: Alcohol, nicotine or tobacco, and drug use. Access to firearms. Diet, exercise, and sleep habits. Safety issues such as seatbelt and bike helmet use. Sunscreen use. Work and work environment. Physical exam Your health care provider will check your: Height and weight. These may be used to calculate your BMI (body mass index). BMI is a measurement that tells if you are at a healthy weight. Waist circumference. This measures the distance around your waistline. This measurement also tells if you are at a healthy weight and may help predict your risk of certain diseases, such as type 2 diabetes and high blood pressure. Heart rate and blood pressure. Body temperature. Skin for abnormal spots. What immunizations do I need?  Vaccines are usually given at various ages, according to a schedule. Your health care provider will recommend vaccines for you based on your age, medical history, and lifestyle or other factors, such as travel or where you work. What tests do I need? Screening Your health care provider may recommend screening tests for certain conditions. This may include: Lipid and cholesterol levels. Diabetes screening. This is done by checking your blood sugar  (glucose) after you have not eaten for a while (fasting). Hepatitis B test. Hepatitis C test. HIV (human immunodeficiency virus) test. STI (sexually transmitted infection) testing, if you are at risk. Lung cancer screening. Prostate cancer screening. Colorectal cancer screening. Talk with your health care provider about your test results, treatment options, and if necessary, the need for more tests. Follow these instructions at home: Eating and drinking  Eat a diet that includes fresh fruits and vegetables, whole grains, lean protein, and low-fat dairy products. Take vitamin and mineral supplements as recommended by your health care provider. Do not drink alcohol if your health care provider tells you not to drink. If you drink alcohol: Limit how much you have to 0-2 drinks a day. Know how much alcohol is in your drink. In the [place] one drink equals one 12 oz bottle of beer (355 mL), one 5 oz glass of wine (148 mL), or one 1 oz glass of hard liquor (44 mL). Lifestyle Brush your teeth every morning and night with fluoride toothpaste. Floss one time each day. Exercise for at least 30 minutes 5 or more days each week. Do not use any products that contain nicotine or tobacco. These products include cigarettes, chewing tobacco, and vaping devices, such as e-cigarettes. If you need help quitting, ask your health care provider. Do not use drugs. If you are sexually active, practice safe sex. Use a condom or other form of protection to prevent STIs. Take aspirin only as told by your health care provider. Make sure that you understand how much to take and what form to take. Work with your health care provider to find out whether it is safe and beneficial for you to take aspirin daily. Find healthy   ways to manage stress, such as: Meditation, yoga, or listening to music. Journaling. Talking to a trusted person. Spending time with friends and family. Minimize exposure to UV radiation to reduce  your risk of skin cancer. Safety Always wear your seat belt while driving or riding in a vehicle. Do not drive: If you have been drinking alcohol. Do not ride with someone who has been drinking. When you are tired or distracted. While texting. If you have been using any mind-altering substances or drugs. Wear a helmet and other protective equipment during sports activities. If you have firearms in your house, make sure you follow all gun safety procedures. What's next? Go to your health care provider once a year for an annual wellness visit. Ask your health care provider how often you should have your eyes and teeth checked. Stay up to date on all vaccines. This information is not intended to replace advice given to you by your health care provider. Make sure you discuss any questions you have with your health care provider. Document Revised: 08/15/2020 Document Reviewed: 08/15/2020 Elsevier Patient Education  2024 Elsevier Inc.  

## 2022-09-10 ENCOUNTER — Encounter: Payer: Self-pay | Admitting: Family Medicine

## 2022-09-10 LAB — HEMOGLOBIN A1C: Hgb A1c MFr Bld: 5.8 % (ref 4.6–6.5)

## 2022-09-10 LAB — COMPREHENSIVE METABOLIC PANEL
ALT: 32 U/L (ref 0–53)
AST: 28 U/L (ref 0–37)
Albumin: 4.6 g/dL (ref 3.5–5.2)
Alkaline Phosphatase: 62 U/L (ref 39–117)
BUN: 14 mg/dL (ref 6–23)
CO2: 29 mEq/L (ref 19–32)
Calcium: 10 mg/dL (ref 8.4–10.5)
Chloride: 101 mEq/L (ref 96–112)
Creatinine, Ser: 1.16 mg/dL (ref 0.40–1.50)
GFR: 71.1 mL/min (ref >60.00)
Glucose, Bld: 82 mg/dL (ref 70–99)
Potassium: 4.6 mEq/L (ref 3.5–5.1)
Sodium: 140 mEq/L (ref 135–145)
Total Bilirubin: 1 mg/dL (ref 0.2–1.2)
Total Protein: 7.3 g/dL (ref 6.0–8.3)

## 2022-09-10 LAB — CBC WITH DIFFERENTIAL/PLATELET
Basophils Absolute: 0 10*3/uL (ref 0.0–0.1)
Basophils Relative: 0.7 % (ref 0.0–3.0)
Eosinophils Absolute: 0.1 10*3/uL (ref 0.0–0.7)
Eosinophils Relative: 0.8 % (ref 0.0–5.0)
HCT: 45.2 % (ref 39.0–52.0)
Hemoglobin: 15.4 g/dL (ref 13.0–17.0)
Lymphocytes Relative: 26.4 % (ref 12.0–46.0)
Lymphs Abs: 1.7 10*3/uL (ref 0.7–4.0)
MCHC: 34.1 g/dL (ref 30.0–36.0)
MCV: 93.3 fl (ref 78.0–100.0)
Monocytes Absolute: 0.5 10*3/uL (ref 0.1–1.0)
Monocytes Relative: 7.6 % (ref 3.0–12.0)
Neutro Abs: 4.3 10*3/uL (ref 1.4–7.7)
Neutrophils Relative %: 64.5 % (ref 43.0–77.0)
Platelets: 220 10*3/uL (ref 150.0–400.0)
RBC: 4.85 Mil/uL (ref 4.22–5.81)
RDW: 12.6 % (ref 11.5–15.5)
WBC: 6.6 10*3/uL (ref 4.0–10.5)

## 2022-09-10 LAB — LIPASE: Lipase: 28 U/L (ref 11.0–59.0)

## 2022-09-10 LAB — AMYLASE: Amylase: 51 U/L (ref 27–131)

## 2022-09-10 LAB — VITAMIN D 25 HYDROXY (VIT D DEFICIENCY, FRACTURES): VITD: 15.05 ng/mL — ABNORMAL LOW (ref 30.00–100.00)

## 2022-09-10 LAB — PSA: PSA: 0.22 ng/mL (ref 0.10–4.00)

## 2022-09-10 NOTE — Assessment & Plan Note (Signed)
Degenerative changes in his lower cervical spine likely causing his intermittent LUCW and LUE discomfort. Presently the symptoms are less frequent and manageable

## 2022-09-10 NOTE — Assessment & Plan Note (Signed)
Lipase improved rapidly after cholecystectomy but not fully to normal will recheck today. Symptomatically his epigastric/lower chest pain have resolved since surgery.

## 2022-09-11 ENCOUNTER — Other Ambulatory Visit: Payer: Self-pay

## 2022-09-11 DIAGNOSIS — E559 Vitamin D deficiency, unspecified: Secondary | ICD-10-CM

## 2022-09-11 MED ORDER — VITAMIN D (ERGOCALCIFEROL) 1.25 MG (50000 UNIT) PO CAPS: 50000.0000 [IU] | ORAL_CAPSULE | ORAL | 4 refills | Status: DC

## 2022-11-17 ENCOUNTER — Other Ambulatory Visit: Payer: Self-pay | Admitting: General Surgery

## 2022-11-17 DIAGNOSIS — R109 Unspecified abdominal pain: Secondary | ICD-10-CM

## 2022-11-17 DIAGNOSIS — M6208 Separation of muscle (nontraumatic), other site: Secondary | ICD-10-CM

## 2022-11-17 DIAGNOSIS — R1900 Intra-abdominal and pelvic swelling, mass and lump, unspecified site: Secondary | ICD-10-CM

## 2022-11-26 ENCOUNTER — Ambulatory Visit
Admission: RE | Admit: 2022-11-26 | Discharge: 2022-11-26 | Disposition: A | Discharge status: Home / Self Care | Payer: No Typology Code available for payment source | Source: Ambulatory Visit | Attending: General Surgery | Admitting: General Surgery

## 2022-11-26 DIAGNOSIS — R109 Unspecified abdominal pain: Secondary | ICD-10-CM

## 2022-11-26 DIAGNOSIS — M6208 Separation of muscle (nontraumatic), other site: Secondary | ICD-10-CM

## 2022-11-26 DIAGNOSIS — R1900 Intra-abdominal and pelvic swelling, mass and lump, unspecified site: Secondary | ICD-10-CM

## 2022-11-26 MED ORDER — IOPAMIDOL (ISOVUE-300) INJECTION 61%
100.0000 mL | Freq: Once | INTRAVENOUS | Status: AC | PRN
  Administered 2022-11-26: 100 mL via INTRAVENOUS

## 2022-12-08 ENCOUNTER — Other Ambulatory Visit: Payer: Self-pay | Admitting: General Surgery

## 2022-12-08 DIAGNOSIS — K8689 Other specified diseases of pancreas: Secondary | ICD-10-CM

## 2022-12-25 ENCOUNTER — Other Ambulatory Visit (INDEPENDENT_AMBULATORY_CARE_PROVIDER_SITE_OTHER): Payer: No Typology Code available for payment source

## 2022-12-25 ENCOUNTER — Other Ambulatory Visit: Payer: Self-pay

## 2022-12-25 ENCOUNTER — Other Ambulatory Visit: Payer: No Typology Code available for payment source

## 2022-12-25 DIAGNOSIS — E559 Vitamin D deficiency, unspecified: Secondary | ICD-10-CM

## 2022-12-25 LAB — VITAMIN D 25 HYDROXY (VIT D DEFICIENCY, FRACTURES): VITD: 48.43 ng/mL (ref 30.00–100.00)

## 2022-12-26 ENCOUNTER — Ambulatory Visit: Payer: Self-pay | Admitting: General Surgery

## 2022-12-26 MED ORDER — KETOROLAC TROMETHAMINE 15 MG/ML IJ SOLN: 15.0000 mg | INTRAMUSCULAR | Status: AC

## 2023-01-15 ENCOUNTER — Encounter: Payer: Self-pay | Admitting: General Surgery

## 2023-01-19 ENCOUNTER — Ambulatory Visit
Admission: RE | Admit: 2023-01-19 | Discharge: 2023-01-19 | Disposition: A | Discharge status: Home / Self Care | Payer: No Typology Code available for payment source | Source: Ambulatory Visit | Attending: General Surgery | Admitting: General Surgery

## 2023-01-19 DIAGNOSIS — K8689 Other specified diseases of pancreas: Secondary | ICD-10-CM

## 2023-01-19 MED ORDER — GADOPICLENOL 0.5 MMOL/ML IV SOLN
10.0000 mL | Freq: Once | INTRAVENOUS | Status: AC | PRN
  Administered 2023-01-19: 10 mL via INTRAVENOUS

## 2023-02-05 ENCOUNTER — Ambulatory Visit (HOSPITAL_COMMUNITY)
Admission: RE | Admit: 2023-02-05 | Payer: No Typology Code available for payment source | Source: Home / Self Care | Admitting: General Surgery

## 2023-02-05 ENCOUNTER — Encounter (HOSPITAL_COMMUNITY): Admission: RE | Payer: Self-pay | Source: Home / Self Care

## 2023-02-05 SURGERY — LAPAROSCOPIC VENTRAL HERNIA
Anesthesia: General

## 2023-02-28 ENCOUNTER — Other Ambulatory Visit: Payer: Self-pay | Admitting: Family Medicine

## 2023-03-02 NOTE — Telephone Encounter (Signed)
Casey Miles, CMA 09/11/2022 11:10 AM EDT Back to Top    Patient viewed mychart and sent medication sent. Called lvm letting Him know we sent it and needing to call back to make 3 month lab appt.     Bradd Canary, MD 09/10/2022 11:44 AM EDT     Lipase and cmp now normal but Labs reveal Vit D deficiency. Start on Vitamin D 81191 IU caps, 1 cap po weekly x 12 weeks. Disp #4 with 4 rf. Also take daily Vitamin D over the counter. If already taking a daily supplement increase by 1000 IU daily and if not start Vitamin D 2000 IU daily.  Recheck level in 3 months    Repeat Vitamin D level in October: Last vitamin D Lab Results  Component Value Date   VD25OH 48.43 12/25/2022   Please advise if he should continue high dose vitamin D?

## 2023-05-31 ENCOUNTER — Other Ambulatory Visit: Payer: Self-pay | Admitting: Cardiology

## 2023-06-04 ENCOUNTER — Ambulatory Visit: Payer: Self-pay | Attending: Cardiology | Admitting: Cardiology

## 2023-06-04 ENCOUNTER — Encounter: Payer: Self-pay | Admitting: Cardiology

## 2023-06-04 VITALS — BP 116/84 | HR 75 | Ht 68.0 in | Wt 232.8 lb

## 2023-06-04 DIAGNOSIS — I2510 Atherosclerotic heart disease of native coronary artery without angina pectoris: Secondary | ICD-10-CM

## 2023-06-04 DIAGNOSIS — E782 Mixed hyperlipidemia: Secondary | ICD-10-CM

## 2023-06-04 NOTE — Patient Instructions (Signed)
 Medication Instructions:  The current medical regimen is effective;  continue present plan and medications.  *If you need a refill on your cardiac medications before your next appointment, please call your pharmacy*   Follow-Up: At [facility], you and your health needs are our priority.  As part of our continuing mission to provide you with exceptional heart care, our providers are all part of one team.  This team includes your primary Cardiologist (physician) and Advanced Practice Providers or APPs (Physician Assistants and Nurse Practitioners) who all work together to provide you with the care you need, when you need it.  Your next appointment:   2 year(s)  Provider:   Donato Schultz, MD    We recommend signing up for the patient portal called "MyChart".  Sign up information is provided on this After Visit Summary.  MyChart is used to connect with patients for Virtual Visits (Telemedicine).  Patients are able to view lab/test results, encounter notes, upcoming appointments, etc.  Non-urgent messages can be sent to your provider as well.   To learn more about what you can do with MyChart, go to [email or web address].      1st Floor: - Lobby - Registration  - Pharmacy  - Lab - Cafe  2nd Floor: - PV Lab - Diagnostic Testing (echo, CT, nuclear med)  3rd Floor: - Vacant  4th Floor: - TCTS (cardiothoracic surgery) - AFib Clinic - Structural Heart Clinic - Vascular Surgery  - Vascular Ultrasound  5th Floor: - HeartCare Cardiology (general and EP) - Clinical Pharmacy for coumadin, hypertension, lipid, weight-loss medications, and med management appointments    Valet parking services will be available as well.

## 2023-06-04 NOTE — Progress Notes (Signed)
 Cardiology Office Note:  .   Date:  06/04/2023  ID:  Bradd Burner, DOB September 27, 1965, MRN [id number] PCP: Bradd Canary, MD  [facility] Providers Cardiologist:  Donato Schultz, MD    History of Present Illness: Casey Miles   SAMUELE STOREY is a 58 y.o. male Discussed the use of AI scribe software for clinical note transcription with the patient, who gave verbal consent to proceed.  History of Present Illness KAMAAL CAST is a 58 year old male with coronary artery disease who presents for follow-up.  He has a history of coronary artery disease with a strong family history of early coronary disease. A cardiac catheterization in 2012 showed no significant coronary artery disease, and a coronary CT in 2021 demonstrated a calcium score of 31 with minimal focal plaque in the left anterior descending artery, which was non-flow limiting. He is currently on high-intensity statin therapy with Crestor 40 mg daily, and his LDL levels have been at goal. He follows a Mediterranean diet and is not currently taking low-dose aspirin.  Last year, he experienced an episode of pancreatitis, which led to the removal of his gallbladder. He describes the onset of symptoms as sudden and intense abdominal pain, likening it to being 'stuck with a knife in the front and the back.' His blood pressure was elevated at 230/120 during the episode. He was evaluated at a local fire department and later at a hospital where his lipase levels were found to be significantly elevated at 700, leading to his admission and subsequent gallbladder surgery.  Post-surgery, he developed a hernia at the site of the surgical scar, which has been causing discomfort, especially when he flexes or rests his arms on it. He is considering surgical repair but is not in a rush due to financial considerations and other family medical expenses.  He has a strong family history of early coronary disease, with his father having had a massive heart attack at age  33.  He reports a history of tachycardia in the past.  His son recently underwent hip surgery for a labral tear, which required reshaping and the insertion of fake cartilage.     Studies Reviewed: Casey Miles   EKG Interpretation Date/Time:  Thursday June 04 2023 16:10:43 EDT Ventricular Rate:  75 PR Interval:  164 QRS Duration:  88 QT Interval:  388 QTC Calculation: 433 R Axis:   -8  Text Interpretation: Normal sinus rhythm Normal ECG When compared with ECG of 21-Jul-2022 22:33, No significant change was found Confirmed by Donato Schultz ([id number]) on 06/04/2023 4:14:49 PM    Results LABS Lipase: 700 (07/24/2022) LDL: 57 A1c: 5.8 Hb: 15.4 Cr: 1.1  RADIOLOGY Coronary CT: Calcium score of 31 with minimal focal plaque of LAD, non-flow limiting (2021)  DIAGNOSTIC REPORTS Cardiac catheterization: No significant CAD (2012) Risk Assessment/Calculations:            Physical Exam:   VS:  BP 116/84   Pulse 75   Ht 5\' 8"  (1.727 m)   Wt 232 lb 12.8 oz (105.6 kg)   SpO2 99%   BMI 35.40 kg/m    Wt Readings from Last 3 Encounters:  06/04/23 232 lb 12.8 oz (105.6 kg)  09/09/22 231 lb (104.8 kg)  07/23/22 230 lb (104.3 kg)    GEN: Well nourished, well developed in no acute distress NECK: No JVD; No carotid bruits CARDIAC: RRR, no murmurs, no rubs, no gallops RESPIRATORY:  Clear to auscultation without rales, wheezing or rhonchi  ABDOMEN: Soft, non-tender, non-distended, ventral hernia noted EXTREMITIES:  No edema; No deformity   ASSESSMENT AND PLAN: .    Assessment and Plan Assessment & Plan Coronary artery disease (CAD) He has coronary artery disease with a minimal focal plaque in the left anterior descending artery (LAD) that is non-flow limiting. He is on high-intensity statin therapy with rosuvastatin 40 mg daily, and his LDL levels are at goal. He has a strong family history of early CAD, with his father having a massive myocardial infarction at age 21. Current management with  statin and lifestyle modifications is appropriate. Large trials show that stable coronary disease managed with medication does as well as with stenting unless symptoms develop. - Continue rosuvastatin 40 mg daily. - Consider starting low-dose aspirin 81 mg daily. - Continue Mediterranean diet and regular exercise. - Schedule follow-up in two years unless symptoms change.  Pancreatitis and cholecystectomy He experienced pancreatitis last year, which led to the discovery of gallstones and subsequent cholecystectomy. The pancreatitis was likely due to gallstone obstruction. The condition is currently resolved.  Post-surgical hernia He developed a hernia at the site of the gallbladder surgery incision. The hernia is not currently urgent but causes discomfort when pressure is applied. He is considering surgical repair but is not in a rush to address it immediately.   Hypertension He experienced a hypertensive episode with a blood pressure of 230/120 during the pancreatitis episode. The episode was acute and related to the pancreatitis event.  Tachycardia He has tachycardia, but no current episodes or management strategies were discussed in this visit.  Follow-up He is well-managed on current CAD treatment and is advised to follow up in two years unless symptoms change. He is also advised to coordinate medication management with his primary care provider, Dr. Rogelia Rohrer. - Schedule follow-up appointment in two years. - Coordinate rosuvastatin prescription with primary care provider, Dr. Rogelia Rohrer.           Signed, Donato Schultz, MD

## 2023-09-01 ENCOUNTER — Other Ambulatory Visit: Payer: Self-pay | Admitting: Cardiology

## 2023-09-06 ENCOUNTER — Emergency Department (HOSPITAL_BASED_OUTPATIENT_CLINIC_OR_DEPARTMENT_OTHER)

## 2023-09-06 ENCOUNTER — Other Ambulatory Visit: Payer: Self-pay

## 2023-09-06 ENCOUNTER — Encounter (HOSPITAL_BASED_OUTPATIENT_CLINIC_OR_DEPARTMENT_OTHER): Payer: Self-pay

## 2023-09-06 ENCOUNTER — Inpatient Hospital Stay (HOSPITAL_BASED_OUTPATIENT_CLINIC_OR_DEPARTMENT_OTHER)
Admission: EM | Admit: 2023-09-06 | Discharge: 2023-09-08 | DRG: 439 | Disposition: A | Discharge status: Home / Self Care | Attending: Internal Medicine | Admitting: Internal Medicine

## 2023-09-06 DIAGNOSIS — Z860101 Personal history of adenomatous and serrated colon polyps: Secondary | ICD-10-CM

## 2023-09-06 DIAGNOSIS — I2510 Atherosclerotic heart disease of native coronary artery without angina pectoris: Secondary | ICD-10-CM | POA: Diagnosis present

## 2023-09-06 DIAGNOSIS — Z79899 Other long term (current) drug therapy: Secondary | ICD-10-CM

## 2023-09-06 DIAGNOSIS — K2970 Gastritis, unspecified, without bleeding: Secondary | ICD-10-CM

## 2023-09-06 DIAGNOSIS — K859 Acute pancreatitis without necrosis or infection, unspecified: Secondary | ICD-10-CM | POA: Diagnosis not present

## 2023-09-06 DIAGNOSIS — K3189 Other diseases of stomach and duodenum: Secondary | ICD-10-CM | POA: Diagnosis present

## 2023-09-06 DIAGNOSIS — K858 Other acute pancreatitis without necrosis or infection: Principal | ICD-10-CM

## 2023-09-06 DIAGNOSIS — K760 Fatty (change of) liver, not elsewhere classified: Secondary | ICD-10-CM | POA: Diagnosis present

## 2023-09-06 DIAGNOSIS — Q402 Other specified congenital malformations of stomach: Secondary | ICD-10-CM

## 2023-09-06 DIAGNOSIS — Z6835 Body mass index (BMI) 35.0-35.9, adult: Secondary | ICD-10-CM

## 2023-09-06 DIAGNOSIS — E785 Hyperlipidemia, unspecified: Secondary | ICD-10-CM | POA: Insufficient documentation

## 2023-09-06 DIAGNOSIS — K439 Ventral hernia without obstruction or gangrene: Secondary | ICD-10-CM | POA: Diagnosis present

## 2023-09-06 DIAGNOSIS — Z9049 Acquired absence of other specified parts of digestive tract: Secondary | ICD-10-CM

## 2023-09-06 DIAGNOSIS — Z83438 Family history of other disorder of lipoprotein metabolism and other lipidemia: Secondary | ICD-10-CM

## 2023-09-06 DIAGNOSIS — F419 Anxiety disorder, unspecified: Secondary | ICD-10-CM | POA: Diagnosis present

## 2023-09-06 DIAGNOSIS — K2289 Other specified disease of esophagus: Secondary | ICD-10-CM | POA: Diagnosis present

## 2023-09-06 DIAGNOSIS — K219 Gastro-esophageal reflux disease without esophagitis: Secondary | ICD-10-CM | POA: Diagnosis present

## 2023-09-06 DIAGNOSIS — Z833 Family history of diabetes mellitus: Secondary | ICD-10-CM

## 2023-09-06 DIAGNOSIS — R7989 Other specified abnormal findings of blood chemistry: Secondary | ICD-10-CM | POA: Diagnosis present

## 2023-09-06 DIAGNOSIS — I10 Essential (primary) hypertension: Secondary | ICD-10-CM | POA: Diagnosis present

## 2023-09-06 DIAGNOSIS — N179 Acute kidney failure, unspecified: Secondary | ICD-10-CM | POA: Diagnosis present

## 2023-09-06 DIAGNOSIS — E66812 Obesity, class 2: Secondary | ICD-10-CM | POA: Diagnosis present

## 2023-09-06 DIAGNOSIS — Z8249 Family history of ischemic heart disease and other diseases of the circulatory system: Secondary | ICD-10-CM

## 2023-09-06 DIAGNOSIS — Z800 Family history of malignant neoplasm of digestive organs: Secondary | ICD-10-CM

## 2023-09-06 DIAGNOSIS — K648 Other hemorrhoids: Secondary | ICD-10-CM | POA: Diagnosis present

## 2023-09-06 DIAGNOSIS — E782 Mixed hyperlipidemia: Secondary | ICD-10-CM | POA: Diagnosis present

## 2023-09-06 DIAGNOSIS — K5730 Diverticulosis of large intestine without perforation or abscess without bleeding: Secondary | ICD-10-CM | POA: Diagnosis present

## 2023-09-06 DIAGNOSIS — K861 Other chronic pancreatitis: Secondary | ICD-10-CM | POA: Diagnosis present

## 2023-09-06 DIAGNOSIS — Z8679 Personal history of other diseases of the circulatory system: Secondary | ICD-10-CM

## 2023-09-06 DIAGNOSIS — Z83719 Family history of colon polyps, unspecified: Secondary | ICD-10-CM

## 2023-09-06 LAB — HEPATIC FUNCTION PANEL
ALT: 66 U/L — ABNORMAL HIGH (ref 0–44)
AST: 97 U/L — ABNORMAL HIGH (ref 15–41)
Albumin: 4.6 g/dL (ref 3.5–5.0)
Alkaline Phosphatase: 70 U/L (ref 38–126)
Bilirubin, Direct: 0.4 mg/dL — ABNORMAL HIGH (ref 0.0–0.2)
Indirect Bilirubin: 0.5 mg/dL (ref 0.3–0.9)
Total Bilirubin: 0.9 mg/dL (ref 0.0–1.2)
Total Protein: 7.2 g/dL (ref 6.5–8.1)

## 2023-09-06 LAB — BASIC METABOLIC PANEL WITH GFR
Anion gap: 14 (ref 5–15)
BUN: 16 mg/dL (ref 6–20)
CO2: 23 mmol/L (ref 22–32)
Calcium: 9.3 mg/dL (ref 8.9–10.3)
Chloride: 104 mmol/L (ref 98–111)
Creatinine, Ser: 1.25 mg/dL — ABNORMAL HIGH (ref 0.61–1.24)
GFR, Estimated: >60 mL/min (ref >60)
Glucose, Bld: 116 mg/dL — ABNORMAL HIGH (ref 70–99)
Potassium: 3.9 mmol/L (ref 3.5–5.1)
Sodium: 142 mmol/L (ref 135–145)

## 2023-09-06 LAB — CBC
HCT: 42.6 % (ref 39.0–52.0)
Hemoglobin: 15.1 g/dL (ref 13.0–17.0)
MCH: 31.8 pg (ref 26.0–34.0)
MCHC: 35.4 g/dL (ref 30.0–36.0)
MCV: 89.7 fL (ref 80.0–100.0)
Platelets: 241 K/uL (ref 150–400)
RBC: 4.75 MIL/uL (ref 4.22–5.81)
RDW: 12.3 % (ref 11.5–15.5)
WBC: 8.3 K/uL (ref 4.0–10.5)
nRBC: 0 % (ref 0.0–0.2)

## 2023-09-06 LAB — AMYLASE: Amylase: 1370 U/L — ABNORMAL HIGH (ref 28–100)

## 2023-09-06 LAB — TROPONIN T, HIGH SENSITIVITY: Troponin T High Sensitivity: <15 ng/L (ref <19)

## 2023-09-06 LAB — LIPASE, BLOOD
Lipase: 2787 U/L — ABNORMAL HIGH (ref 11–51)
Lipase: >2800 U/L — ABNORMAL HIGH (ref 11–51)

## 2023-09-06 MED ORDER — LACTATED RINGERS IV SOLN: INTRAVENOUS | Status: AC

## 2023-09-06 MED ORDER — ONDANSETRON HCL 4 MG/2ML IJ SOLN: 4.0000 mg | Freq: Four times a day (QID) | INTRAMUSCULAR | Status: DC | PRN

## 2023-09-06 MED ORDER — LACTATED RINGERS IV BOLUS: 1000.0000 mL | Freq: Once | INTRAVENOUS | Status: DC

## 2023-09-06 MED ORDER — MORPHINE SULFATE (PF) 2 MG/ML IV SOLN: 1.0000 mg | INTRAVENOUS | Status: DC | PRN

## 2023-09-06 MED ORDER — IOHEXOL 300 MG/ML  SOLN
100.0000 mL | Freq: Once | INTRAMUSCULAR | Status: AC | PRN
  Administered 2023-09-06: 100 mL via INTRAVENOUS

## 2023-09-06 MED ORDER — SODIUM CHLORIDE 0.9 % IV BOLUS
1000.0000 mL | Freq: Once | INTRAVENOUS | Status: AC
  Administered 2023-09-06: 1000 mL via INTRAVENOUS

## 2023-09-06 NOTE — ED Triage Notes (Signed)
 At 1700 started having mid left back pain that started radiating to abdomen and chest, nausea, difficulty taking deep breath.   Hx of pancreatitis with similar pain.

## 2023-09-06 NOTE — ED Provider Notes (Signed)
 [facility] Provider Note   CSN: [id number] Arrival date & time: 09/06/23  1738     Patient presents with: Chest Pain   Casey Miles is a 58 y.o. male.   Patient to ED for evaluation of pain that started earlier today in the upper back at bilateral scapular tips, the radiated around to low chest bilaterally, then into the abdomen. He states when the pain was present, it was hard to take a breath as it made the pain worse. No cough, fever. He reports nausea without vomiting. He states this is how it felt a year ago when he had gall stone pancreatitis. He underwent cholecystectomy at that time.    Chest Pain      Prior to Admission medications   Medication Sig Start Date End Date Taking? Authorizing Provider  rosuvastatin  (CRESTOR ) 40 MG tablet TAKE 1 TABLET BY MOUTH EVERY DAY 09/01/23   Jeffrie Oneil BROCKS, MD  VITAMIN D  PO Take by mouth. daily    [provider]    Allergies: Patient has no known allergies.    Review of Systems  Cardiovascular:  Positive for chest pain.    Updated Vital Signs BP 131/84 (BP Location: Right Arm)   Pulse 65   Temp 98.6 F (37 C) (Oral)   Resp 16   Ht 5' 8 (1.727 m)   Wt 106.6 kg   SpO2 96%   BMI 35.73 kg/m   Physical Exam Vitals and nursing note reviewed.  Constitutional:      Appearance: He is well-developed.  HENT:     Head: Normocephalic.  Cardiovascular:     Rate and Rhythm: Normal rate and regular rhythm.     Heart sounds: No murmur heard. Pulmonary:     Effort: Pulmonary effort is normal.     Breath sounds: Normal breath sounds. No wheezing, rhonchi or rales.  Abdominal:     General: Bowel sounds are normal.     Palpations: Abdomen is soft.     Tenderness: There is no abdominal tenderness. There is no guarding or rebound.  Musculoskeletal:        General: Normal range of motion.     Cervical back: Normal range of motion and neck supple.  Skin:    General: Skin is  warm and dry.  Neurological:     General: No focal deficit present.     Mental Status: He is alert and oriented to person, place, and time.     (all labs ordered are listed, but only abnormal results are displayed) Labs Reviewed  BASIC METABOLIC PANEL WITH GFR - Abnormal; Notable for the following components:      Result Value   Glucose, Bld 116 (*)    Creatinine, Ser 1.25 (*)    All other components within normal limits  LIPASE, BLOOD - Abnormal; Notable for the following components:   Lipase 2,787 (*)    All other components within normal limits  LIPASE, BLOOD - Abnormal; Notable for the following components:   Lipase >2,800 (*)    All other components within normal limits  AMYLASE - Abnormal; Notable for the following components:   Amylase 1,370 (*)    All other components within normal limits  HEPATIC FUNCTION PANEL - Abnormal; Notable for the following components:   AST 97 (*)    ALT 66 (*)    Bilirubin, Direct 0.4 (*)    All other components within normal limits  CBC  URINALYSIS,  ROUTINE W REFLEX MICROSCOPIC  CREATININE, URINE, RANDOM  SODIUM, URINE, RANDOM  TRIGLYCERIDES  TROPONIN T, HIGH SENSITIVITY   Results for orders placed or performed during the hospital encounter of 09/06/23  Basic metabolic panel   Collection Time: 09/06/23  5:55 PM  Result Value Ref Range   Sodium 142 135 - 145 mmol/L   Potassium 3.9 3.5 - 5.1 mmol/L   Chloride 104 98 - 111 mmol/L   CO2 23 22 - 32 mmol/L   Glucose, Bld 116 (H) 70 - 99 mg/dL   BUN 16 6 - 20 mg/dL   Creatinine, Ser 8.74 (H) 0.61 - 1.24 mg/dL   Calcium  9.3 8.9 - 10.3 mg/dL   GFR, Estimated >39 >39 mL/min   Anion gap 14 5 - 15  CBC   Collection Time: 09/06/23  5:55 PM  Result Value Ref Range   WBC 8.3 4.0 - 10.5 K/uL   RBC 4.75 4.22 - 5.81 MIL/uL   Hemoglobin 15.1 13.0 - 17.0 g/dL   HCT 57.3 60.9 - 47.9 %   MCV 89.7 80.0 - 100.0 fL   MCH 31.8 26.0 - 34.0 pg   MCHC 35.4 30.0 - 36.0 g/dL   RDW 87.6 88.4 - 84.4 %    Platelets 241 150 - 400 K/uL   nRBC 0.0 0.0 - 0.2 %  Lipase, blood   Collection Time: 09/06/23  5:55 PM  Result Value Ref Range   Lipase 2,787 (H) 11 - 51 U/L  Amylase   Collection Time: 09/06/23  5:55 PM  Result Value Ref Range   Amylase 1,370 (H) 28 - 100 U/L  Hepatic function panel   Collection Time: 09/06/23  5:55 PM  Result Value Ref Range   Total Protein 7.2 6.5 - 8.1 g/dL   Albumin 4.6 3.5 - 5.0 g/dL   AST 97 (H) 15 - 41 U/L   ALT 66 (H) 0 - 44 U/L   Alkaline Phosphatase 70 38 - 126 U/L   Total Bilirubin 0.9 0.0 - 1.2 mg/dL   Bilirubin, Direct 0.4 (H) 0.0 - 0.2 mg/dL   Indirect Bilirubin 0.5 0.3 - 0.9 mg/dL  Troponin T, High Sensitivity   Collection Time: 09/06/23  5:55 PM  Result Value Ref Range   Troponin T High Sensitivity <15 <19 ng/L  Lipase, blood   Collection Time: 09/06/23  8:14 PM  Result Value Ref Range   Lipase >2,800 (H) 11 - 51 U/L     EKG: EKG Interpretation Date/Time:  Sunday September 06 2023 17:44:35 EDT Ventricular Rate:  82 PR Interval:  167 QRS Duration:  96 QT Interval:  374 QTC Calculation: 437 R Axis:   -19  Text Interpretation: Sinus rhythm Borderline left axis deviation tremor artifact. no sig change from previoius Confirmed by Armenta Canning [phone]) on 09/06/2023 6:23:13 PM  Radiology: CT ABDOMEN PELVIS W CONTRAST Result Date: 09/06/2023 CLINICAL DATA:  Left lower quadrant pain EXAM: CT ABDOMEN AND PELVIS WITH CONTRAST TECHNIQUE: Multidetector CT imaging of the abdomen and pelvis was performed using the standard protocol following bolus administration of intravenous contrast. RADIATION DOSE REDUCTION: This exam was performed according to the departmental dose-optimization program which includes automated exposure control, adjustment of the mA and/or kV according to patient size and/or use of iterative reconstruction technique. CONTRAST:  OMNIPAQUE  IOHEXOL  300 MG/ML  SOLN COMPARISON:  MRI 01/19/2023, CT 11/26/2022 FINDINGS: Lower chest:  Lung bases demonstrate no acute airspace disease. Hepatobiliary: Central hepatic cyst. Cholecystectomy. No biliary dilatation Pancreas: No ductal dilatation.  No definitive inflammatory change Spleen: Normal in size without focal abnormality. Adrenals/Urinary Tract: Adrenal glands are normal. Kidneys show no hydronephrosis. The bladder is normal Stomach/Bowel: Stomach is within normal limits. Appendix appears normal. No evidence of bowel wall thickening, distention, or inflammatory changes. Mild diverticular disease of the left colon. Vascular/Lymphatic: Aortic atherosclerosis. No enlarged abdominal or pelvic lymph nodes. Reproductive: Prostate is unremarkable. Other: Negative for pelvic effusion or free air. Small fat containing supraumbilical ventral hernia Musculoskeletal: No acute or suspicious osseous abnormality IMPRESSION: 1. No CT evidence for acute intra-abdominal or pelvic abnormality. 2. Mild diverticular disease of the left colon without acute inflammatory process. 3. Small fat containing supraumbilical ventral hernia. 4. Aortic atherosclerosis. Aortic Atherosclerosis (ICD10-I70.0). Electronically Signed   By: Luke Bun M.D.   On: 09/06/2023 19:42   DG Chest 2 View Result Date: 09/06/2023 CLINICAL DATA:  Chest pain. EXAM: CHEST - 2 VIEW COMPARISON:  Radiographs 07/21/2022 and 03/02/2014. Cardiac CT 07/25/2019. FINDINGS: The heart size and mediastinal contours are stable. There is stable prominent fat at the right cardiophrenic angle. The lungs are clear. No pleural effusion or pneumothorax. Stable minimal degenerative changes in the spine without acute osseous abnormality. IMPRESSION: No evidence of acute cardiopulmonary process. Electronically Signed   By: Elsie Perone M.D.   On: 09/06/2023 18:25     Procedures   Medications Ordered in the ED  lactated ringers  infusion (has no administration in time range)  ondansetron  (ZOFRAN ) injection 4 mg (has no administration in time range)   morphine  (PF) 2 MG/ML injection 1-2 mg (has no administration in time range)  lactated ringers  bolus 1,000 mL (has no administration in time range)  iohexol  (OMNIPAQUE ) 300 MG/ML solution 100 mL (100 mLs Intravenous Contrast Given 09/06/23 1909)  sodium chloride  0.9 % bolus 1,000 mL (0 mLs Intravenous Stopped 09/06/23 2348)    Clinical Course as of 09/07/23 0000  Sun Sep 06, 2023  2320 Patient is accepted by [facility]) and has a ready bed at [facility]. He is seen and examined by Dr. Elbridge and found to be stable and comfortable for POV transportation. He is considered reliable to present to [facility] as planned. IV fluids provided prior to departure. Spouse has ready bed information and they are aware they need to go to admitting. All questions answered.  [SU]    Clinical Course User Index [SU] Odell Balls, PA-C                                 Medical Decision Making This patient presents to the ED for concern of back, chest, abdominal pain, this involves an extensive number of treatment options, and is a complaint that carries with it a high risk of complications and morbidity.  The differential diagnosis includes dissection, PNA, PE, PTX, PUD, pancreatitis, ACS,    Co morbidities that complicate the patient evaluation  H/o pancreatitis s/p cholecystectomy, kidney stone, GERD, HLD   Additional history obtained:  Additional history and/or information obtained from chart review, notable for admission for gall stone pancreatitis 0-07/2022   Lab Tests:  I Ordered, and personally interpreted labs.  The pertinent results include:   Lipase 2784 Lipase recheck (new draw) >2800 CBC - no leukocytosis, normal hgb, normal plts Cmet: Glucose 116, Cr 1.25, otherwise normal values     Imaging Studies ordered:  I ordered imaging studies including CT abd/pel Per radiologist interpretation:  IMPRESSION: 1. No CT evidence for acute intra-abdominal  or pelvic abnormality. 2. Mild diverticular  disease of the left colon without acute inflammatory process. 3. Small fat containing supraumbilical ventral hernia. 4. Aortic atherosclerosis.    Cardiac Monitoring:  The patient was maintained on a cardiac monitor.  I personally viewed and interpreted the cardiac monitored which showed an underlying rhythm of:  EKG Interpretation Date/Time:  Sunday September 06 2023 17:44:35 EDT Ventricular Rate:  82 PR Interval:  167 QRS Duration:  96 QT Interval:  374 QTC Calculation: 437 R Axis:   -19  Text Interpretation: Sinus rhythm Borderline left axis deviation tremor artifact. no sig change from previoius Confirmed by Armenta Canning [phone]) on 09/06/2023 6:23:13 PM   Medicines ordered and prescription drug management:  I ordered medication including none  for no pain in ED, no nausea Reevaluation of the patient after these medicines showed that the patient stayed the same - pain-free I have reviewed the patients home medicines and have made adjustments as needed   Test Considered:  Lipase repeated from new draw to verify   Critical Interventions:  N/a   Consultations Obtained:  I requested consultation with the n/a,  and discussed lab and imaging findings as well as pertinent plan - they recommend: n/a   Problem List / ED Course:  Here with pain as described in the HPI this afternoon. Currently pain-free in the ED VSS - labs remarkable for significantly elevated lipase of >2700 - he is s/p cholecystectomy, triglycerides 152 07/2022, normal in 2022 CT abd/pel negative for significant pancreatic inflammation Patient continues to be pain-free  He will require admission to normalize the lipase, monitor pancreas   Reevaluation:  After the interventions noted above, I reevaluated the patient and found that they have :stayed the same   Social Determinants of Health:  Never a smoker   Disposition:  After consideration of the diagnostic results and the patients response  to treatment, I feel that the patient would benefit from admit. He has been accepted by [facility], bed assigned at [facility]. Per Dr. Armenta, he can go POV to [facility] admitting..   Amount and/or Complexity of Data Reviewed Labs: ordered. Radiology: ordered.  Risk Prescription drug management. Decision regarding hospitalization.        Final diagnoses:  Other acute pancreatitis without infection or necrosis    ED Discharge Orders     None          Odell Balls, PA-C 09/07/23 0001    Armenta Canning, MD 09/11/23 1625

## 2023-09-06 NOTE — ED Notes (Signed)
 Patient transported to CT

## 2023-09-06 NOTE — ED Provider Notes (Signed)
 I provided a substantive portion of the care of this patient.  I personally made/approved the management plan for this patient and take responsibility for the patient management.  EKG Interpretation Date/Time:  Sunday September 06 2023 17:44:35 EDT Ventricular Rate:  82 PR Interval:  167 QRS Duration:  96 QT Interval:  374 QTC Calculation: 437 R Axis:   -19  Text Interpretation: Sinus rhythm Borderline left axis deviation tremor artifact. no sig change from previoius Confirmed by Armenta Canning [phone]) on 09/06/2023 6:23:13 PM   Patient very acute onset of severe pain in his thoracic back wrapping around to his abdomen.  He has prior history of pancreatitis with similar event.  The patient reports it was so severe that he could hardly became lightheaded and nauseated..  Symptoms actually spontaneously improved.  At this time patient is comfortable.  I have done examination.  He is alert nontoxic.  Vital signs are stable.  Abdomen is soft without guarding.  Patient has significantly elevated lipase.  I agree with plan for admission.  Patient is adamant that he wants to go by private vehicle for admission.  At this time with stable vital signs and pain controlled will agree for POV although I have recommended to the patient transport by ambulance.   Armenta Canning, MD 09/06/23 [phone]

## 2023-09-06 NOTE — Plan of Care (Signed)
 Plan of Care Note for accepted transfer   Patient name: Casey Miles FMW:[id number] DOB: 1965/05/20  Facility requesting transfer: [facility] ED Requesting Provider: Odell Balls, PA-C Reason for transfer: Acute pancreatitis Facility course: 58 year old male with history of CAD, pancreatitis, cholecystectomy, postsurgical hernia, hypertension, hyperlipidemia, obesity presented with complaints of pain in her upper back, chest, and abdomen.  Chest x-ray showing no acute cardiopulmonary process.  CT abdomen pelvis showing no acute intra-abdominal or pelvic abnormality.  EKG without acute ischemic changes and troponin x 1 negative.  Lipase >2800, no fever or leukocytosis.  Patient was started on IV fluids.  Plan of care: The patient is accepted for admission to Telemetry unit at [facility].  [facility] will assume care on arrival to accepting facility. Until arrival, care as per EDP. However, [facility] available 24/7 for questions and assistance.  Check [email or web address] for on-call coverage.  Nursing staff, please call [facility] Admits & Consults System-Wide number under Amion on patient's arrival so appropriate admitting provider can evaluate the pt.

## 2023-09-06 NOTE — H&P (Incomplete)
 History and Physical    Casey Miles FMW:[id number] DOB: 1965/06/29 DOA: 09/06/2023  PCP: Domenica Harlene LABOR, MD   Patient coming from: Home   Chief Complaint:  Chief Complaint  Patient presents with   Chest Pain   ED TRIAGE note:  At 1700 started having mid left back pain that started radiating to abdomen and chest, nausea, difficulty taking deep breath.    Hx of pancreatitis with similar pain.      HPI:  Casey Miles is a 58 y.o. male with medical history significant of gallstone pancreatitis s/p cholecystectomy, umbilical hernia, essential hypertension, CAD, hyperlipidemia and morbid obesity presented emergency department complaining of abdominal pain with radiation to back and chest. Her workup in the ED showed elevated lipase above 2800.  CT abdomen pelvis no acute finding.  Patient has been transferred from [facility] to [facility] for management of acute pancreatitis.   ED Course:  At presentation to ED patient is hemodynamically stable. CBC unremarkable. BMP unremarkable except elevated creatinine 1.25. Elevated lipase level >2800.  CT abdomen pelvis no acute intra-abdominal finding.: IMPRESSION: 1. No CT evidence for acute intra-abdominal or pelvic abnormality. 2. Mild diverticular disease of the left colon without acute inflammatory process. 3. Small fat containing supraumbilical ventral hernia. 4. Aortic atherosclerosis.  Chest x-ray unremarkable.  EKG showed normal sinus rhythm heart rate 82, normal QTc intervals.  In the ED patient received 1 L of NS bolus.  Hospitalist has been consulted for management of acute pancreatitis and acute kidney injury. Per chart review patient follows [facility].  Significant labs in the ED: Lab Orders         Basic metabolic panel         CBC         Lipase, blood         Lipase, blood         Triglycerides         Hepatic function panel         Amylase         Urinalysis, Routine w reflex  microscopic -Urine, Clean Catch         Creatinine, urine, random         Sodium, urine, random       Review of Systems:  Review of Systems  Constitutional:  Negative for chills, fever, malaise/fatigue and weight loss.  Respiratory:  Negative for cough and sputum production.   Cardiovascular:  Negative for chest pain and palpitations.  Gastrointestinal:  Positive for abdominal pain. Negative for blood in stool, constipation, diarrhea, heartburn, melena, nausea and vomiting.  Genitourinary:  Negative for dysuria and urgency.  Musculoskeletal:  Negative for myalgias.  Neurological:  Negative for dizziness and headaches.    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   Elevated BP 10/08/2011   Elevated LFTs 06/19/2016   Fatigue 01/08/2011   GERD (gastroesophageal reflux disease)    Hyperglycemia 06/17/2016   Hyperlipidemia 10/08/2010   Kidney stone    NECK PAIN 09/26/2009   Neuromuscular disorder (HCC)    C4C5 herniated disc    Other acute reactions to stress 09/26/2009   Other and  unspecified hyperlipidemia 01/23/2013   PARESTHESIA 11/22/2009   Preventative health care 08/01/2012   SHOULDER PAIN, LEFT 07/17/2008   Testosterone  deficiency 07/09/2011   Vitamin D  deficiency 07/08/2011    Past Surgical History:  Procedure Laterality Date   CARDIAC CATHETERIZATION     2013-2014   CHOLECYSTECTOMY N/A 07/23/2022   Procedure: LAPAROSCOPIC CHOLECYSTECTOMY WITH INTRAOPERATIVE CHOLANGIOGRAM;  Surgeon: Curvin Deward MOULD, MD;  Location: [facility] OR;  Service: General;  Laterality: N/A;   UMBILICAL HERNIA REPAIR  2017   UPPER GASTROINTESTINAL ENDOSCOPY       reports that he has never smoked. He has never used smokeless  tobacco. He reports current alcohol use of about 1.0 - 2.0 standard drink of alcohol per week. He reports that he does not use drugs.  No Known Allergies  Family History  Problem Relation Age of Onset   Diabetes Mother    Hypertension Mother    Hyperlipidemia Mother    Heart attack Father 23   Diabetes Maternal Grandmother    Heart disease Maternal Grandmother    Colitis Maternal Grandmother    Colitis Maternal Grandfather    Heart disease Maternal Grandfather    Diabetes Paternal Grandmother    Lupus Cousin    Irritable bowel syndrome Other    Cancer Other        Esophageal- Great Grandfather   Stomach cancer Other        Uncle   Colon polyps Other        Grandmother   Diabetes Other        Family history of   Hypertension Other        Family history of   Heart attack Other        Family history of   Irritable bowel syndrome Other        Family history of   Colon cancer Neg Hx    Esophageal cancer Neg Hx    Rectal cancer Neg Hx     Prior to Admission medications   Medication Sig Start Date End Date Taking? Authorizing Provider  rosuvastatin  (CRESTOR ) 40 MG tablet TAKE 1 TABLET BY MOUTH EVERY DAY 09/01/23   Jeffrie Oneil BROCKS, MD  VITAMIN D  PO Take by mouth. daily    [provider]     Physical Exam: Vitals:   09/06/23 2242 09/06/23 2245 09/06/23 2300 09/06/23 2310  BP:   135/85   Pulse:  64 65 67  Resp:  (!) 24 17 (!) 22  Temp: 98.2 F (36.8 C)     TempSrc: Oral     SpO2:  98% 97% 97%  Weight:      Height:        Physical Exam Constitutional:      Appearance: He is obese. He is not ill-appearing.  Cardiovascular:     Rate and Rhythm: Normal rate and regular rhythm.  Pulmonary:     Effort: Pulmonary effort is normal.     Breath sounds: Normal breath sounds.  Abdominal:     Palpations: Abdomen is soft. There is no hepatomegaly, splenomegaly or mass.     Tenderness: There is abdominal tenderness. There is no guarding or rebound.  Skin:     Capillary Refill: Capillary refill takes less than 2 seconds.  Neurological:     Mental Status: He is alert and oriented to person, place, and time.  Psychiatric:        Mood and Affect: Mood normal. Mood is not anxious.  Behavior: Behavior normal.      Labs on Admission: I have personally reviewed following labs and imaging studies  CBC: Recent Labs  Lab 09/06/23 1755  WBC 8.3  HGB 15.1  HCT 42.6  MCV 89.7  PLT 241   Basic Metabolic Panel: Recent Labs  Lab 09/06/23 1755  NA 142  K 3.9  CL 104  CO2 23  GLUCOSE 116*  BUN 16  CREATININE 1.25*  CALCIUM  9.3   GFR: Estimated Creatinine Clearance: 78.1 mL/min (A) (by C-G formula based on SCr of 1.25 mg/dL (H)). Liver Function Tests: No results for input(s): AST, ALT, ALKPHOS, BILITOT, PROT, ALBUMIN in the last 168 hours. Recent Labs  Lab 09/06/23 1755 09/06/23 2014  LIPASE 2,787* >2,800*   No results for input(s): AMMONIA in the last 168 hours. Coagulation Profile: No results for input(s): INR, PROTIME in the last 168 hours. Cardiac Enzymes: No results for input(s): CKTOTAL, CKMB, CKMBINDEX, TROPONINI, TROPONINIHS in the last 168 hours. BNP (last 3 results) No results for input(s): BNP in the last 8760 hours. HbA1C: No results for input(s): HGBA1C in the last 72 hours. CBG: No results for input(s): GLUCAP in the last 168 hours. Lipid Profile: No results for input(s): CHOL, HDL, LDLCALC, TRIG, CHOLHDL, LDLDIRECT in the last 72 hours. Thyroid  Function Tests: No results for input(s): TSH, T4TOTAL, FREET4, T3FREE, THYROIDAB in the last 72 hours. Anemia Panel: No results for input(s): VITAMINB12, FOLATE, FERRITIN, TIBC, IRON, RETICCTPCT in the last 72 hours. Urine analysis:    Component Value Date/Time   COLORURINE YELLOW 08/05/2017 0001   APPEARANCEUR CLEAR 08/05/2017 0001   LABSPEC 1.020 08/05/2017 0001   PHURINE 6.0 08/05/2017 0001    GLUCOSEU NEGATIVE 08/05/2017 0001   HGBUR TRACE (A) 08/05/2017 0001   BILIRUBINUR NEGATIVE 08/05/2017 0001   KETONESUR NEGATIVE 08/05/2017 0001   PROTEINUR NEGATIVE 08/05/2017 0001   UROBILINOGEN 1.0 02/27/2009 1917   NITRITE NEGATIVE 08/05/2017 0001   LEUKOCYTESUR NEGATIVE 08/05/2017 0001    Radiological Exams on Admission: I have personally reviewed images CT ABDOMEN PELVIS W CONTRAST Result Date: 09/06/2023 CLINICAL DATA:  Left lower quadrant pain EXAM: CT ABDOMEN AND PELVIS WITH CONTRAST TECHNIQUE: Multidetector CT imaging of the abdomen and pelvis was performed using the standard protocol following bolus administration of intravenous contrast. RADIATION DOSE REDUCTION: This exam was performed according to the departmental dose-optimization program which includes automated exposure control, adjustment of the mA and/or kV according to patient size and/or use of iterative reconstruction technique. CONTRAST:  OMNIPAQUE  IOHEXOL  300 MG/ML  SOLN COMPARISON:  MRI 01/19/2023, CT 11/26/2022 FINDINGS: Lower chest: Lung bases demonstrate no acute airspace disease. Hepatobiliary: Central hepatic cyst. Cholecystectomy. No biliary dilatation Pancreas: No ductal dilatation.  No definitive inflammatory change Spleen: Normal in size without focal abnormality. Adrenals/Urinary Tract: Adrenal glands are normal. Kidneys show no hydronephrosis. The bladder is normal Stomach/Bowel: Stomach is within normal limits. Appendix appears normal. No evidence of bowel wall thickening, distention, or inflammatory changes. Mild diverticular disease of the left colon. Vascular/Lymphatic: Aortic atherosclerosis. No enlarged abdominal or pelvic lymph nodes. Reproductive: Prostate is unremarkable. Other: Negative for pelvic effusion or free air. Small fat containing supraumbilical ventral hernia Musculoskeletal: No acute or suspicious osseous abnormality IMPRESSION: 1. No CT evidence for acute intra-abdominal or pelvic abnormality.  2. Mild diverticular disease of the left colon without acute inflammatory process. 3. Small fat containing supraumbilical ventral hernia. 4. Aortic atherosclerosis. Aortic Atherosclerosis (ICD10-I70.0). Electronically Signed   By: Luke Bun M.D.   On: 09/06/2023  19:42   DG Chest 2 View Result Date: 09/06/2023 CLINICAL DATA:  Chest pain. EXAM: CHEST - 2 VIEW COMPARISON:  Radiographs 07/21/2022 and 03/02/2014. Cardiac CT 07/25/2019. FINDINGS: The heart size and mediastinal contours are stable. There is stable prominent fat at the right cardiophrenic angle. The lungs are clear. No pleural effusion or pneumothorax. Stable minimal degenerative changes in the spine without acute osseous abnormality. IMPRESSION: No evidence of acute cardiopulmonary process. Electronically Signed   By: Elsie Perone M.D.   On: 09/06/2023 18:25     EKG: My personal interpretation of EKG shows: Normal sinus rhythm heart rate 81.    Assessment/Plan: Principal Problem:   Acute pancreatitis Active Problems:   AKI (acute kidney injury) (HCC)   Essential hypertension   History of cholecystectomy   History of CAD (coronary artery disease)   Hyperlipidemia    Assessment and Plan: Acute pancreatitis History of gallstone pancreatitis s/p cholecystectomy in 2024 -Presenting to emergency department complaining of sudden onset of abdominal pain that been started around 5 PM today.  Associated nausea and vomiting.  Reported history of pancreatitis last year found to have secondary to gallstone related underwent cholecystectomy - At presentation to ED patient is hemodynamically stable - CBC unremarkable.  BMP showed elevated creatinine 1.25 otherwise unremarkable.  Elevated triglyceride level above 2800.  Pending hepatic function and triglyceride level. -CT abdomen pelvis no acute intra-abdominal or pelvic finding. Mild diverticular disease of the left colon without acute inflammatory process. Small fat containing  supraumbilical ventral hernia.  Aortic atherosclerosis. -In the ED patient received 1 L of LR bolus. -Giving second liter of LR bolus and continue maintenance fluid LR 150 cc/h - Continue Zofran  as needed and morphine  as needed for pain control. -Starting clear liquid diet. -Patient follows [facility] outpatient.  Consulted and sent secure chat message to [facility] Dr. Leigh for further evaluation   Acute kidney injury-secondary to dehydration -Elevated creatinine 1.25.  Prerenal acute kidney injury in the setting of dehydration in the context of poor oral intake from acute pancreatitis - Continue maintenance fluid.  Checking UA, urine creatinine and sodium.  Avoid nephrotoxic agent.  Monitor improvement of renal function.  Essential hypertension -Normotensive.  At home patient is not any antihypertensive medication.  History of  CAD -History of nonobstructive CAD.  Continue Crestor  and aspirin  81 mg daily.  Hyperlipidemia Continue Crestor    DVT prophylaxis:  Lovenox  Code Status:  Full Code Diet: Clear liquid diet Family Communication:   Family was present at bedside, at the time of interview. Opportunity was given to ask question and all questions were answered satisfactorily.  Disposition Plan: Continue monitor improvement of lipase level and acute pancreatitis episode. Consults: Gastroenterology Admission status:   Inpatient, Telemetry bed  Severity of Illness: The appropriate patient status for this patient is INPATIENT. Inpatient status is judged to be reasonable and necessary in order to provide the required intensity of service to ensure the patient's safety. The patient's presenting symptoms, physical exam findings, and initial radiographic and laboratory data in the context of their chronic comorbidities is felt to place them at high risk for further clinical deterioration. Furthermore, it is not anticipated that the patient will be medically stable for discharge from  the hospital within 2 midnights of admission.   * I certify that at the point of admission it is my clinical judgment that the patient will require inpatient hospital care spanning beyond 2 midnights from the point of admission due to high intensity of service,  high risk for further deterioration and high frequency of surveillance required.DEWAINE    Amarie Viles, MD [facility]  How to contact the [facility] Attending or Consulting provider 7A - 7P or covering provider during after hours 7P -7A, for this patient.  Check the care team in [facility] and look for a) attending/consulting [facility] provider listed and b) the [facility] team listed Log into [email or web address] and use [facility]'s universal password to access. If you do not have the password, please contact the hospital operator. Locate the [facility] provider you are looking for under [facility] and page to a number that you can be directly reached. If you still have difficulty reaching the provider, please page the [facility] (Director on Call) for the Hospitalists listed on amion for assistance.  09/06/2023, 11:11 PM

## 2023-09-07 DIAGNOSIS — K5730 Diverticulosis of large intestine without perforation or abscess without bleeding: Secondary | ICD-10-CM | POA: Diagnosis present

## 2023-09-07 DIAGNOSIS — K859 Acute pancreatitis without necrosis or infection, unspecified: Secondary | ICD-10-CM

## 2023-09-07 DIAGNOSIS — N179 Acute kidney failure, unspecified: Secondary | ICD-10-CM | POA: Diagnosis present

## 2023-09-07 DIAGNOSIS — E66812 Obesity, class 2: Secondary | ICD-10-CM | POA: Diagnosis present

## 2023-09-07 DIAGNOSIS — Z8249 Family history of ischemic heart disease and other diseases of the circulatory system: Secondary | ICD-10-CM | POA: Diagnosis not present

## 2023-09-07 DIAGNOSIS — Z83719 Family history of colon polyps, unspecified: Secondary | ICD-10-CM | POA: Diagnosis not present

## 2023-09-07 DIAGNOSIS — R7989 Other specified abnormal findings of blood chemistry: Secondary | ICD-10-CM | POA: Diagnosis not present

## 2023-09-07 DIAGNOSIS — Z6835 Body mass index (BMI) 35.0-35.9, adult: Secondary | ICD-10-CM | POA: Diagnosis not present

## 2023-09-07 DIAGNOSIS — K869 Disease of pancreas, unspecified: Secondary | ICD-10-CM | POA: Diagnosis not present

## 2023-09-07 DIAGNOSIS — Z8679 Personal history of other diseases of the circulatory system: Secondary | ICD-10-CM

## 2023-09-07 DIAGNOSIS — K760 Fatty (change of) liver, not elsewhere classified: Secondary | ICD-10-CM | POA: Diagnosis present

## 2023-09-07 DIAGNOSIS — K648 Other hemorrhoids: Secondary | ICD-10-CM | POA: Diagnosis present

## 2023-09-07 DIAGNOSIS — E782 Mixed hyperlipidemia: Secondary | ICD-10-CM

## 2023-09-07 DIAGNOSIS — Z833 Family history of diabetes mellitus: Secondary | ICD-10-CM | POA: Diagnosis not present

## 2023-09-07 DIAGNOSIS — Z800 Family history of malignant neoplasm of digestive organs: Secondary | ICD-10-CM | POA: Diagnosis not present

## 2023-09-07 DIAGNOSIS — I899 Noninfective disorder of lymphatic vessels and lymph nodes, unspecified: Secondary | ICD-10-CM | POA: Diagnosis not present

## 2023-09-07 DIAGNOSIS — I2510 Atherosclerotic heart disease of native coronary artery without angina pectoris: Secondary | ICD-10-CM | POA: Diagnosis present

## 2023-09-07 DIAGNOSIS — I10 Essential (primary) hypertension: Secondary | ICD-10-CM | POA: Diagnosis present

## 2023-09-07 DIAGNOSIS — Z9049 Acquired absence of other specified parts of digestive tract: Secondary | ICD-10-CM

## 2023-09-07 DIAGNOSIS — K219 Gastro-esophageal reflux disease without esophagitis: Secondary | ICD-10-CM | POA: Diagnosis present

## 2023-09-07 DIAGNOSIS — Z860101 Personal history of adenomatous and serrated colon polyps: Secondary | ICD-10-CM | POA: Diagnosis not present

## 2023-09-07 DIAGNOSIS — Q402 Other specified congenital malformations of stomach: Secondary | ICD-10-CM | POA: Diagnosis not present

## 2023-09-07 DIAGNOSIS — K861 Other chronic pancreatitis: Secondary | ICD-10-CM | POA: Diagnosis present

## 2023-09-07 DIAGNOSIS — Z83438 Family history of other disorder of lipoprotein metabolism and other lipidemia: Secondary | ICD-10-CM | POA: Diagnosis not present

## 2023-09-07 DIAGNOSIS — K3189 Other diseases of stomach and duodenum: Secondary | ICD-10-CM | POA: Diagnosis present

## 2023-09-07 DIAGNOSIS — K2289 Other specified disease of esophagus: Secondary | ICD-10-CM | POA: Diagnosis present

## 2023-09-07 DIAGNOSIS — K439 Ventral hernia without obstruction or gangrene: Secondary | ICD-10-CM | POA: Diagnosis present

## 2023-09-07 DIAGNOSIS — Z79899 Other long term (current) drug therapy: Secondary | ICD-10-CM | POA: Diagnosis not present

## 2023-09-07 DIAGNOSIS — E785 Hyperlipidemia, unspecified: Secondary | ICD-10-CM

## 2023-09-07 LAB — CBC
HCT: 40.7 % (ref 39.0–52.0)
HCT: 39.2 % (ref 39.0–52.0)
Hemoglobin: 14 g/dL (ref 13.0–17.0)
Hemoglobin: 13.6 g/dL (ref 13.0–17.0)
MCH: 31.9 pg (ref 26.0–34.0)
MCH: 31.9 pg (ref 26.0–34.0)
MCHC: 34.4 g/dL (ref 30.0–36.0)
MCHC: 34.7 g/dL (ref 30.0–36.0)
MCV: 92.7 fL (ref 80.0–100.0)
MCV: 91.8 fL (ref 80.0–100.0)
Platelets: 169 K/uL (ref 150–400)
Platelets: 165 K/uL (ref 150–400)
RBC: 4.39 MIL/uL (ref 4.22–5.81)
RBC: 4.27 MIL/uL (ref 4.22–5.81)
RDW: 12.2 % (ref 11.5–15.5)
RDW: 12.4 % (ref 11.5–15.5)
WBC: 8.2 K/uL (ref 4.0–10.5)
WBC: 7.5 K/uL (ref 4.0–10.5)
nRBC: 0 % (ref 0.0–0.2)
nRBC: 0 % (ref 0.0–0.2)

## 2023-09-07 LAB — COMPREHENSIVE METABOLIC PANEL WITH GFR
ALT: 86 U/L — ABNORMAL HIGH (ref 0–44)
ALT: 79 U/L — ABNORMAL HIGH (ref 0–44)
AST: 76 U/L — ABNORMAL HIGH (ref 15–41)
AST: 63 U/L — ABNORMAL HIGH (ref 15–41)
Albumin: 3.6 g/dL (ref 3.5–5.0)
Albumin: 3.5 g/dL (ref 3.5–5.0)
Alkaline Phosphatase: 53 U/L (ref 38–126)
Alkaline Phosphatase: 55 U/L (ref 38–126)
Anion gap: 7 (ref 5–15)
Anion gap: 9 (ref 5–15)
BUN: 14 mg/dL (ref 6–20)
BUN: 12 mg/dL (ref 6–20)
CO2: 26 mmol/L (ref 22–32)
CO2: 25 mmol/L (ref 22–32)
Calcium: 8.7 mg/dL — ABNORMAL LOW (ref 8.9–10.3)
Calcium: 8.8 mg/dL — ABNORMAL LOW (ref 8.9–10.3)
Chloride: 105 mmol/L (ref 98–111)
Chloride: 105 mmol/L (ref 98–111)
Creatinine, Ser: 1.01 mg/dL (ref 0.61–1.24)
Creatinine, Ser: 0.97 mg/dL (ref 0.61–1.24)
GFR, Estimated: >60 mL/min (ref >60)
GFR, Estimated: >60 mL/min (ref >60)
Glucose, Bld: 102 mg/dL — ABNORMAL HIGH (ref 70–99)
Glucose, Bld: 94 mg/dL (ref 70–99)
Potassium: 4.2 mmol/L (ref 3.5–5.1)
Potassium: 4.2 mmol/L (ref 3.5–5.1)
Sodium: 138 mmol/L (ref 135–145)
Sodium: 139 mmol/L (ref 135–145)
Total Bilirubin: 1.4 mg/dL — ABNORMAL HIGH (ref 0.0–1.2)
Total Bilirubin: 1.1 mg/dL (ref 0.0–1.2)
Total Protein: 6.3 g/dL — ABNORMAL LOW (ref 6.5–8.1)
Total Protein: 6.3 g/dL — ABNORMAL LOW (ref 6.5–8.1)

## 2023-09-07 LAB — LIPID PANEL
Cholesterol: 110 mg/dL (ref 0–200)
HDL: 35 mg/dL — ABNORMAL LOW (ref >40)
LDL Cholesterol: 48 mg/dL (ref 0–99)
Total CHOL/HDL Ratio: 3.1 ratio
Triglycerides: 133 mg/dL (ref <150)
VLDL: 27 mg/dL (ref 0–40)

## 2023-09-07 LAB — URINALYSIS, ROUTINE W REFLEX MICROSCOPIC
Bilirubin Urine: NEGATIVE
Glucose, UA: NEGATIVE mg/dL
Hgb urine dipstick: NEGATIVE
Ketones, ur: NEGATIVE mg/dL
Leukocytes,Ua: NEGATIVE
Nitrite: NEGATIVE
Protein, ur: NEGATIVE mg/dL
Specific Gravity, Urine: 1.042 — ABNORMAL HIGH (ref 1.005–1.030)
pH: 5 (ref 5.0–8.0)

## 2023-09-07 LAB — BILIRUBIN, DIRECT: Bilirubin, Direct: 0.2 mg/dL (ref 0.0–0.2)

## 2023-09-07 LAB — PHOSPHORUS
Phosphorus: 2.9 mg/dL (ref 2.5–4.6)
Phosphorus: 3.2 mg/dL (ref 2.5–4.6)

## 2023-09-07 LAB — HIV ANTIBODY (ROUTINE TESTING W REFLEX): HIV Screen 4th Generation wRfx: NONREACTIVE

## 2023-09-07 LAB — MAGNESIUM
Magnesium: 1.9 mg/dL (ref 1.7–2.4)
Magnesium: 1.7 mg/dL (ref 1.7–2.4)

## 2023-09-07 LAB — OSMOLALITY, URINE: Osmolality, Ur: 914 mosm/kg — ABNORMAL HIGH (ref 300–900)

## 2023-09-07 LAB — HEPATITIS PANEL, ACUTE
HCV Ab: NONREACTIVE
Hep A IgM: NONREACTIVE
Hep B C IgM: NONREACTIVE
Hepatitis B Surface Ag: NONREACTIVE

## 2023-09-07 LAB — CK
Total CK: 162 U/L (ref 49–397)
Total CK: 201 U/L (ref 49–397)

## 2023-09-07 LAB — OSMOLALITY: Osmolality: 299 mosm/kg — ABNORMAL HIGH (ref 275–295)

## 2023-09-07 LAB — TROPONIN I (HIGH SENSITIVITY)
Troponin I (High Sensitivity): 3 ng/L (ref <18)
Troponin I (High Sensitivity): 3 ng/L (ref <18)

## 2023-09-07 LAB — CREATININE, URINE, RANDOM: Creatinine, Urine: 194 mg/dL

## 2023-09-07 LAB — SODIUM, URINE, RANDOM: Sodium, Ur: 136 mmol/L

## 2023-09-07 LAB — LIPASE, BLOOD: Lipase: 220 U/L — ABNORMAL HIGH (ref 11–51)

## 2023-09-07 LAB — SEDIMENTATION RATE: Sed Rate: 3 mm/h (ref 0–16)

## 2023-09-07 LAB — C-REACTIVE PROTEIN: CRP: <0.5 mg/dL (ref <1.0)

## 2023-09-07 LAB — TRIGLYCERIDES: Triglycerides: 349 mg/dL — ABNORMAL HIGH (ref <150)

## 2023-09-07 MED ORDER — METOCLOPRAMIDE HCL 5 MG/ML IJ SOLN: 5.0000 mg | Freq: Four times a day (QID) | INTRAMUSCULAR | Status: DC | PRN

## 2023-09-07 MED ORDER — ACETAMINOPHEN 650 MG RE SUPP
650.0000 mg | Freq: Four times a day (QID) | RECTAL | Status: DC | PRN
Start: 2023-09-07 — End: 2023-09-08

## 2023-09-07 MED ORDER — ACETAMINOPHEN 325 MG PO TABS: 650.0000 mg | ORAL_TABLET | Freq: Four times a day (QID) | ORAL | Status: DC | PRN

## 2023-09-07 MED ORDER — HYDROCODONE-ACETAMINOPHEN 5-325 MG PO TABS: 1.0000 | ORAL_TABLET | ORAL | Status: DC | PRN

## 2023-09-07 NOTE — Consult Note (Cosign Needed Addendum)
 Referring Provider: Dr. CANDIE Speaker Primary Care Physician:  Domenica Harlene LABOR, MD Primary Gastroenterologist: Dr. Lynnie Bring  Reason for Consultation: Acute pancreatitis  HPI: Casey Miles is a 58 y.o. male with a past medical history of cervical disc disease, hyperlipidemia, hypertriglyceridemia, mild coronary artery disease per CT 2021, hepatic steatosis, diverticulosis, colon polyps and gallstones s/p cholecystectomy 07/2022.  He presented to the [facility] ED 09/06/2023 with nausea, mid back pain between his shoulder blades which radiated around to his chest and upper abdomen which was similar to the pain he experienced when he had pancreatitis. Labs in the ED showed a WBC count of 8.3.  Hemoglobin 15.1.  Hematocrit 42.6.  Platelet 241.  BUN 16.  Creatinine 1.25 (Cr 1.16 one year ago).  Lipase 2,787.  Repeat lipase > 2,800.  Amylase 1370.  Total bili 0.9.  Direct bili 0.4.  Alk phos 70.  AST 97.  ALT 66.  Calcium  9.3.  Negative chest x-ray.  CTAP with contrast showed a normal pancreas without ductal dilatation, s/p cholecystectomy physiology without biliary ductal dilatation and a central hepatic cyst. A small fat-containing supraumbilical ventral hernia, diverticulosis without diverticulitis and aortic atherosclerosis were also noted.  He was transferred to [facility] for admission and for further evaluation regarding acute pancreatitis.  Labs today: Lipase 220.  Acute hepatitis panel pending.  Total bili 1.4.  Alk phos 53.  AST 76.  ALT 86.  Cholesterol 110.  Triglycerides 133.  CK 162.  Sed rate 3.  CRP pending.  Troponin 3.  He felt well yesterday morning and ate breakfast with his family at 10:30 AM which consisted of eggs, bacon, fried potatoes, biscuits with sausage gravy which she has eaten on many occasions without any GI symptoms. He drank Pepsi at 5 PM and shortly after developed significant pain to his mid back between his shoulder blades which radiated around to his  chest and RUQ and LUQ. He described feeling as if a small pig was on his chest. He had difficulty taking in a deep breath in. He developed intense sweats with worsening back and abdominal pain and was on his knees in the ED then his symptoms abruptly diminished. Since then, he continues to have low level mid back pain. His mid back pain is worse when he lays on his back and improves when he turns on his side or sits up. No further nausea. No vomiting.  He currently rates his back pain a 1 on scale 1-10. No alcohol use for the past 6 to 9 months. Prior to that, he drank 2-3 alcoholic beverages every few weeks and noted drinking heavily in his 20s and 30s. No new medications within the past 6 months. No known family history of pancreatic disease.  No bloody or black stools.  He was previously seen by our inpatient GI service 07/22/2018 for secondary to having intermittent biliary colic symptoms admitted with acute pancreatitis. MRCP was negative for choledocholithiasis and he subsequently underwent a laparoscopic cholecystectomy with negative IOC 07/23/2022.  Path report showed chronic cholecystitis.  GI PROCEDURES:  Colonoscopy 07/07/2022: - One 4 mm polyp in the proximal ascending colon, removed with a cold snare. Resected and retrieved.  - One 8 mm polyp in the proximal sigmoid colon, removed with a hot snare. Resected and retrieved.  - Mild sigmoid diverticulosis  - Non-bleeding internal hemorrhoids. - 5 year recall colonoscopy  1. Surgical [P], colon, ascending polyp x 1, polyp (1) - SESSILE SERRATED POLYP WITHOUT  CYTOLOGIC DYSPLASIA 2. Surgical [P], colon, sigmoid polyp x 1, polyp (1) - TUBULAR ADENOMA WITHOUT HIGH-GRADE DYSPLASIA OR MALIGNANCY  Colonoscopy 05/24/2019: - A 12 mm polyp was found in the distal sigmoid colon, 25 cm from the anal verge. The polyp was semi-pedunculated with a thick pedicle. The polyp was removed with a hot snare. Resection and retrieval were complete.  - A few rare  small-mouthed diverticula were found in the sigmoid colon. - The terminal ileum appeared normal.  - The exam was otherwise without abnormality on direct and retroflexion views.  - Non-bleeding internal hemorrhoids were found during retroflexion. The hemorrhoids were small.  EGD 10/31/2009: Normal EGD Ectopic gastric mucosa in the proximal esophagus  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   Elevated BP 10/08/2011   Elevated LFTs 06/19/2016   Fatigue 01/08/2011   GERD (gastroesophageal reflux disease)    Hyperglycemia 06/17/2016   Hyperlipidemia 10/08/2010   Kidney stone    NECK PAIN 09/26/2009   Neuromuscular disorder (HCC)    C4C5 herniated disc    Other acute reactions to stress 09/26/2009   Other and unspecified hyperlipidemia 01/23/2013   PARESTHESIA 11/22/2009   Preventative health care 08/01/2012   SHOULDER PAIN, LEFT 07/17/2008   Testosterone  deficiency 07/09/2011   Vitamin D  deficiency 07/08/2011    Past Surgical History:  Procedure Laterality Date   CARDIAC CATHETERIZATION     2013-2014   CHOLECYSTECTOMY N/A 07/23/2022   Procedure: LAPAROSCOPIC CHOLECYSTECTOMY WITH INTRAOPERATIVE CHOLANGIOGRAM;  Surgeon: Curvin Deward MOULD, MD;  [facility]: [facility] OR;  Service: General;  Laterality: N/A;   UMBILICAL HERNIA REPAIR  2017   UPPER GASTROINTESTINAL ENDOSCOPY      Prior to Admission medications   Medication Sig Start Date End Date Taking? Authorizing Provider  rosuvastatin  (CRESTOR ) 40 MG tablet TAKE 1 TABLET BY MOUTH EVERY DAY 09/01/23  Yes Jeffrie Oneil BROCKS, MD    Current Facility-Administered Medications  Medication Dose Route Frequency Provider Last Rate Last Admin   acetaminophen  (TYLENOL ) tablet 650 mg   650 mg Oral Q6H PRN Doutova, Anastassia, MD       Or   acetaminophen  (TYLENOL ) suppository 650 mg  650 mg Rectal Q6H PRN Doutova, Anastassia, MD       HYDROcodone -acetaminophen  (NORCO/VICODIN) 5-325 MG per tablet 1-2 tablet  1-2 tablet Oral Q4H PRN Doutova, Anastassia, MD       lactated ringers  infusion   Intravenous Continuous Doutova, Anastassia, MD 150 mL/hr at 09/07/23 0735 New Bag at 09/07/23 0735   metoCLOPramide  (REGLAN ) injection 5 mg  5 mg Intravenous Q6H PRN Doutova, Anastassia, MD       morphine  (PF) 2 MG/ML injection 1-2 mg  1-2 mg Intravenous Q2H PRN Doutova, Anastassia, MD       ondansetron  (ZOFRAN ) injection 4 mg  4 mg Intravenous Q6H PRN Doutova, Anastassia, MD        Allergies as of 09/06/2023   (No Known Allergies)    Family History  Problem Relation Age of Onset   Diabetes Mother    Hypertension Mother    Hyperlipidemia Mother    Heart attack Father 18   Diabetes Maternal Grandmother    Heart disease Maternal Grandmother  Colitis Maternal Grandmother    Colitis Maternal Grandfather    Heart disease Maternal Grandfather    Diabetes Paternal Grandmother    Lupus Cousin    Irritable bowel syndrome Other    Cancer Other        Esophageal- Great Grandfather   Stomach cancer Other        Uncle   Colon polyps Other        Grandmother   Diabetes Other        Family history of   Hypertension Other        Family history of   Heart attack Other        Family history of   Irritable bowel syndrome Other        Family history of   Colon cancer Neg Hx    Esophageal cancer Neg Hx    Rectal cancer Neg Hx     Social History   Socioeconomic History   Marital status: Married    Spouse name: Not on file   Number of children: Not on file   Years of education: Not on file   Highest education level: Not on file  Occupational History   Not on file  Tobacco Use   Smoking status: Never   Smokeless tobacco: Never  Vaping Use   Vaping status: Never Used   Substance and Sexual Activity   Alcohol use: Yes    Alcohol/week: 1.0 - 2.0 standard drink of alcohol    Types: 1 - 2 Standard drinks or equivalent per week    Comment: 1-2 per week   Drug use: No   Sexual activity: Yes    Comment: lives wife and children, exercises intermittently, heart healthy diet.  Other Topics Concern   Not on file  Social History Narrative   Not on file   Social Drivers of Health   Financial Resource Strain: Not on file  Food Insecurity: No Food Insecurity (09/07/2023)   Hunger Vital Sign    Worried About Running Out of Food in the Last Year: Never true    Ran Out of Food in the Last Year: Never true  Transportation Needs: No Transportation Needs (09/07/2023)   PRAPARE - Administrator, Civil Service (Medical): No    Lack of Transportation (Non-Medical): No  Physical Activity: Not on file  Stress: Not on file  Social Connections: Unknown (07/14/2021)   Received from [facility]   Social Network    Social Network: Not on file  Intimate Partner Violence: Not At Risk (09/07/2023)   Humiliation, Afraid, Rape, and Kick questionnaire    Fear of Current or Ex-Partner: No    Emotionally Abused: No    Physically Abused: No    Sexually Abused: No   Review of Systems: Gen: Denies fever, sweats or chills. No weight loss.  CV: Denies chest pain, palpitations or edema. Resp: Denies cough, shortness of breath of hemoptysis.  GI:See HPI.  No GERD symptoms. GU : Denies urinary burning, blood in urine, increased urinary frequency or incontinence. MS: + Back pain. Derm: Denies rash, itchiness, skin lesions or unhealing ulcers. Psych: Denies depression, anxiety, memory loss or confusion. Heme: Denies easy bruising, bleeding. Neuro:  Denies headaches, dizziness or paresthesias. Endo:  Denies any problems with DM, thyroid  or adrenal function.  Physical Exam: Vital signs in last 24 hours: Temp:  [97.9 F (36.6 C)-98.6 F (37 C)] 98.1 F (36.7 C) (07/07  0825) Pulse Rate:  [60-91] 72 (07/07 0825) Resp:  [  12-24] 16 (07/07 0825) BP: (113-157)/(77-106) 154/100 (07/07 0825) SpO2:  [94 %-99 %] 99 % (07/07 0825) Weight:  [106.6 kg] 106.6 kg (07/06 1743) Last BM Date : 09/06/23 General:  Alert, well-developed, well-nourished, pleasant and cooperative in NAD. Head:  Normocephalic and atraumatic. Eyes:  No scleral icterus. Conjunctiva pink. Ears:  Normal auditory acuity. Nose:  No deformity, discharge or lesions. Mouth:  Dentition intact. No ulcers or lesions.  Neck:  Supple. No lymphadenopathy or thyromegaly.  Lungs: Breath sounds clear throughout. No wheezes, rhonchi or crackles.  Heart: Regular rate and rhythm.  No murmurs. Abdomen: Soft, nondistended.  Nontender.  Supraumbilical/ventral hernia present.  Positive bowel sounds all 4 quadrants.  No hepatosplenomegaly. Rectal: Deferred. Musculoskeletal:  Symmetrical without gross deformities.  Pulses:  Normal pulses noted. Extremities:  Without clubbing or edema. Neurologic:  Alert and  oriented x 4. No focal deficits.  Skin:  Intact without significant lesions or rashes. Psych:  Alert and cooperative. Normal mood and affect.  Intake/Output from previous day: 07/06 0701 - 07/07 0700 In: 1658.4 [I.V.:657.7; IV Piggyback:1000.6] Out: -  Intake/Output this shift: No intake/output data recorded.  Lab Results: Recent Labs    09/06/23 1755 09/07/23 0806  WBC 8.3 8.2  HGB 15.1 14.0  HCT 42.6 40.7  PLT 241 169   BMET Recent Labs    09/06/23 1755 09/07/23 0806  NA 142 138  K 3.9 4.2  CL 104 105  CO2 23 26  GLUCOSE 116* 102*  BUN 16 14  CREATININE 1.25* 1.01  CALCIUM  9.3 8.7*   LFT Recent Labs    09/06/23 1755 09/07/23 0806  PROT 7.2 6.3*  ALBUMIN 4.6 3.6  AST 97* 76*  ALT 66* 86*  ALKPHOS 70 53  BILITOT 0.9 1.4*  BILIDIR 0.4*  --   IBILI 0.5  --    PT/INR No results for input(s): LABPROT, INR in the last 72 hours. Hepatitis Panel No results for input(s):  HEPBSAG, HCVAB, HEPAIGM, HEPBIGM in the last 72 hours.    Studies/Results: CT ABDOMEN PELVIS W CONTRAST Result Date: 09/06/2023 CLINICAL DATA:  Left lower quadrant pain EXAM: CT ABDOMEN AND PELVIS WITH CONTRAST TECHNIQUE: Multidetector CT imaging of the abdomen and pelvis was performed using the standard protocol following bolus administration of intravenous contrast. RADIATION DOSE REDUCTION: This exam was performed according to the departmental dose-optimization program which includes automated exposure control, adjustment of the mA and/or kV according to patient size and/or use of iterative reconstruction technique. CONTRAST:  OMNIPAQUE  IOHEXOL  300 MG/ML  SOLN COMPARISON:  MRI 01/19/2023, CT 11/26/2022 FINDINGS: Lower chest: Lung bases demonstrate no acute airspace disease. Hepatobiliary: Central hepatic cyst. Cholecystectomy. No biliary dilatation Pancreas: No ductal dilatation.  No definitive inflammatory change Spleen: Normal in size without focal abnormality. Adrenals/Urinary Tract: Adrenal glands are normal. Kidneys show no hydronephrosis. The bladder is normal Stomach/Bowel: Stomach is within normal limits. Appendix appears normal. No evidence of bowel wall thickening, distention, or inflammatory changes. Mild diverticular disease of the left colon. Vascular/Lymphatic: Aortic atherosclerosis. No enlarged abdominal or pelvic lymph nodes. Reproductive: Prostate is unremarkable. Other: Negative for pelvic effusion or free air. Small fat containing supraumbilical ventral hernia Musculoskeletal: No acute or suspicious osseous abnormality IMPRESSION: 1. No CT evidence for acute intra-abdominal or pelvic abnormality. 2. Mild diverticular disease of the left colon without acute inflammatory process. 3. Small fat containing supraumbilical ventral hernia. 4. Aortic atherosclerosis. Aortic Atherosclerosis (ICD10-I70.0). Electronically Signed   By: Luke Bun M.D.   On: 09/06/2023 19:42  DG  Chest 2 View Result Date: 09/06/2023 CLINICAL DATA:  Chest pain. EXAM: CHEST - 2 VIEW COMPARISON:  Radiographs 07/21/2022 and 03/02/2014. Cardiac CT 07/25/2019. FINDINGS: The heart size and mediastinal contours are stable. There is stable prominent fat at the right cardiophrenic angle. The lungs are clear. No pleural effusion or pneumothorax. Stable minimal degenerative changes in the spine without acute osseous abnormality. IMPRESSION: No evidence of acute cardiopulmonary process. Electronically Signed   By: Elsie Perone M.D.   On: 09/06/2023 18:25    IMPRESSION/PLAN:  58 year old male with a history of gallstone pancreatitis status post laparoscopic cholecystectomy 07/23/2022 who presents with mid back pain between the shoulder bladed, upper abdominal pain, acute pancreatitis and elevated LFTs. CTAP without showed a normal pancreas without evidence of biliary ductal dilatation or choledocholithiasis. I suspect he possibly had a CBD stone which passed spontaneously.  Admission lipase 2, 787 -> > 2,800 -> 220. T. Bili 0.9 -> 1.4. Alk phos 53. AST 97 -> 76. ALT 66 -> 86.  Normal calcium  and triglyceride levels.  Afebrile.  No alcohol x 6 to 9 months.  Hemodynamically stable. -Consider abdominal MRI/MRCP during this hospitalization, to discuss further with Dr. Wilhelmenia -Add direct bili to a.m. lab draw -CBC and BMP at 2pm -IV fluids and pain meds per the hospitalist -Ondansetron  4 mg PO or IV every 6 hours as needed -Clear liquid diet  Hepatic steatosis - Follow-up as an outpatient  History of colon polyps.  Colonoscopy 07/2022 identified 2 sessile serrated/tubular adenomatous polyps removed from the colon. - Next colon polyp surveillance colonoscopy due 07/2027  Supraumbilical ventral hernia - Follow-up with general surgery as an outpatient  History of CAD with coronary calcium  score 31 per CT 2021  Elida CHRISTELLA Shawl  09/07/2023, 10:33 AM

## 2023-09-07 NOTE — Assessment & Plan Note (Addendum)
 mild will rehydrate Obtain urine electrolytes

## 2023-09-07 NOTE — Assessment & Plan Note (Signed)
 Chronic stable Resume aspirin  and Crestor  when able to tolerate p.o.

## 2023-09-07 NOTE — Assessment & Plan Note (Addendum)
 Rehydrate and repeat If persists may need dedicated liver studies such as ultrasound Patient status post cholecystectomy CT scan showing no acute process For completion obtain hepatitis serologies check CK

## 2023-09-07 NOTE — Plan of Care (Signed)

## 2023-09-07 NOTE — Assessment & Plan Note (Signed)
 Contributing to comorbidity and complicating medical management  Body mass index is 35.73 kg/m.  Nutritional follow up as an out pt would be recommended

## 2023-09-07 NOTE — Assessment & Plan Note (Signed)
 Patient not on BP meds continue to monitor

## 2023-09-07 NOTE — Assessment & Plan Note (Signed)
 Restart Crestor  when able to tolerate p.o.'s

## 2023-09-07 NOTE — H&P (Signed)
 Casey Miles FMW:[id number] DOB: 12/04/1965 DOA: 09/06/2023     PCP: Casey Miles LABOR, MD   Outpatient Specialists:  CARDS:  Dr. Oneil Parchment, MD  [facility] Patient arrived to ER on 09/06/23 at 1738 Referred by Attending Casey Ales, MD   Patient coming from:    home Lives  With family      Chief Complaint:   Chief Complaint  Patient presents with   Chest Pain    HPI: Casey Miles is a 58 y.o. male with medical history significant of CAD, pancreatitis, cholecystectomy, postsurgical hernia, hypertension, hyperlipidemia, obesity   Presented with  back/abd pain  Patient started to have back pain radiating to abdomen chest with nausea has history of pancreatitis in the past and felt similar to that No associated cough or fever Last year he had a gallstone induced pancreatitis and underwent a cholecystectomy at that time Patient presented to [facility] found to have a lipase of 2 oh thousand 787     During last Pancreatitis he had severe elevated BP but not this time  Not on BP meds Reports that his abd pain has improved Reports still having back pain Last EtOH was 6 m ago Has had some recent fatty foods   Denies significant ETOH intake   Does not smoke   Denies marijuana use      Regarding pertinent Chronic problems:    Hyperlipidemia -  on statins crestor  Lipid Panel     Component Value Date/Time   CHOL 119 07/22/2022 0655   CHOL 150 11/15/2019 0816   TRIG 152 (H) 07/22/2022 0655   HDL 32 (L) 07/22/2022 0655   HDL 41 11/15/2019 0816   CHOLHDL 3.7 07/22/2022 0655   VLDL 30 07/22/2022 0655   LDLCALC 57 07/22/2022 0655   LDLCALC 78 11/15/2019 0816   LDLDIRECT 88.0 01/06/2019 0941   LABVLDL 31 11/15/2019 0816       CAD  - On Aspirin , statin,                  - followed by cardiology                  minimal focal plaque in the left anterior descending artery (LAD) that is non-flow limiting.       obesity-   BMI Readings from Last 1 Encounters:   09/06/23 35.73 kg/m        While in ER: Clinical Course as of 09/07/23 0041  Sun Sep 06, 2023  2320 Patient is accepted by [facility] Casey Miles) and has a ready bed at [facility]. He is seen and examined by Casey Miles and found to be stable and comfortable for POV transportation. He is considered reliable to present to [facility] as planned. IV fluids provided prior to departure. Spouse has ready bed information and they are aware they need to go to admitting. All questions answered.  [SU]    Clinical Course User Index [SU] Casey Balls, PA-C       Lab Orders         Basic metabolic panel         CBC         Lipase, blood         Lipase, blood         Amylase         Urinalysis, Routine w reflex microscopic -Urine, Clean Catch         Creatinine, urine, random  Sodium, urine, random         Hepatic function panel         Triglycerides        CXR -  NON acute  CTabd/pelvis -  non-acute, no evidence of pancretitis   Following Medications were ordered in ER: Medications  lactated ringers  infusion (has no administration in time range)  ondansetron  (ZOFRAN ) injection 4 mg (has no administration in time range)  morphine  (PF) 2 MG/ML injection 1-2 mg (has no administration in time range)  lactated ringers  bolus 1,000 mL (has no administration in time range)  iohexol  (OMNIPAQUE ) 300 MG/ML solution 100 mL (100 mLs Intravenous Contrast Given 09/06/23 1909)  sodium chloride  0.9 % bolus 1,000 mL (0 mLs Intravenous Stopped 09/06/23 2348)       ED Triage Vitals  Encounter Vitals Group     BP 09/06/23 1747 134/81     Girls Systolic BP Percentile --      Girls Diastolic BP Percentile --      Boys Systolic BP Percentile --      Boys Diastolic BP Percentile --      Pulse Rate 09/06/23 1747 79     Resp 09/06/23 1747 (!) 24     Temp 09/06/23 1747 98.5 F (36.9 C)     Temp Source 09/06/23 2237 Oral     SpO2 09/06/23 1747 99 %     Weight 09/06/23 1743 235 lb (106.6 kg)     Height 09/06/23 1743 5'  8 (1.727 m)     Head Circumference --      Peak Flow --      Pain Score 09/06/23 1743 10     Pain Loc --      Pain Education --      Exclude from Growth Chart --   UFJK(75)@     _________________________________________ Significant initial  Findings: Abnormal Labs Reviewed  BASIC METABOLIC PANEL WITH GFR - Abnormal; Notable for the following components:      Result Value   Glucose, Bld 116 (*)    Creatinine, Ser 1.25 (*)    All other components within normal limits  LIPASE, BLOOD - Abnormal; Notable for the following components:   Lipase 2,787 (*)    All other components within normal limits  LIPASE, BLOOD - Abnormal; Notable for the following components:   Lipase >2,800 (*)    All other components within normal limits  AMYLASE - Abnormal; Notable for the following components:   Amylase 1,370 (*)    All other components within normal limits  HEPATIC FUNCTION PANEL - Abnormal; Notable for the following components:   AST 97 (*)    ALT 66 (*)    Bilirubin, Direct 0.4 (*)    All other components within normal limits   _________ Troponin <15    ECG: Ordered Personally reviewed and interpreted by me showing: HR : 82 Rhythm:Sinus rhythm Borderline left axis deviation QTC 437    The recent clinical data is shown below. Vitals:   09/06/23 2300 09/06/23 2310 09/06/23 2343 09/07/23 0029  BP: 135/85  131/84 (!) 157/106  Pulse: 65 67 65 91  Resp: 17 (!) 22 16 18   Temp:   98.6 F (37 C) 97.9 F (36.6 C)  TempSrc:   Oral Oral  SpO2: 97% 97% 96% 96%  Weight:      Height:        WBC     Component Value Date/Time   WBC 8.3 09/06/2023 1755  LYMPHSABS 1.7 09/09/2022 1537   MONOABS 0.5 09/09/2022 1537   EOSABS 0.1 09/09/2022 1537   BASOSABS 0.0 09/09/2022 1537      __________________________________________________________ Recent Labs  Lab 09/06/23 1755  NA 142  K 3.9  CO2 23  GLUCOSE 116*  BUN 16  CREATININE 1.25*  CALCIUM  9.3    Cr   Up from baseline see  below Lab Results  Component Value Date   CREATININE 1.25 (H) 09/06/2023   CREATININE 1.16 09/09/2022   CREATININE 1.09 07/23/2022    Recent Labs  Lab 09/06/23 1755  AST 97*  ALT 66*  ALKPHOS 70  BILITOT 0.9  PROT 7.2  ALBUMIN 4.6   Lab Results  Component Value Date   CALCIUM  9.3 09/06/2023   PHOS 2.8 10/07/2013    Plt: Lab Results  Component Value Date   PLT 241 09/06/2023    Recent Labs  Lab 09/06/23 1755  WBC 8.3  HGB 15.1  HCT 42.6  MCV 89.7  PLT 241      Down *Up from baseline see below    Component Value Date/Time   HGB 15.1 09/06/2023 1755   HCT 42.6 09/06/2023 1755   MCV 89.7 09/06/2023 1755     Recent Labs  Lab 09/06/23 1755 09/06/23 2014  LIPASE 2,787* >2,800*  AMYLASE 1,370*  --    No results for input(s): AMMONIA in the last 168 hours.    _______________________________________________ Hospitalist was called for admission for pancreatitis, dehydration   The following Work up has been ordered so far:  Orders Placed This Encounter  Procedures   DG Chest 2 View   CT ABDOMEN PELVIS W CONTRAST   Basic metabolic panel   CBC   Lipase, blood   Lipase, blood   Amylase   Urinalysis, Routine w reflex microscopic -Urine, Clean Catch   Creatinine, urine, random   Sodium, urine, random   Hepatic function panel   Triglycerides   Diet clear liquid Room service appropriate? Yes; Fluid consistency: Thin   Document Height and Actual Weight   Cardiac Monitoring Continuous x 24 hours Indications for use: Other; Other indications for use: Close monitoring   EKG 12-Lead   Place in observation (patient's expected length of stay will be less than 2 midnights)     OTHER Significant initial  Findings:  labs showing:     DM  labs:  HbA1C: Recent Labs    09/09/22 1537  HGBA1C 5.8       CBG (last 3)  No results for input(s): GLUCAP in the last 72 hours.        Cultures:    Component Value Date/Time   SDES  08/03/2017 1014     URINE, CLEAN CATCH Performed at [facility], [address] [place], [place] [address]    [facility]  08/03/2017 1014    NONE Performed at [facility], [address], [place], [place] [address]    CULT  08/03/2017 1014    NO GROWTH Performed at [facility], [address], [place], [place] [address]    REPTSTATUS 08/04/2017 FINAL 08/03/2017 1014     Radiological Exams on Admission: CT ABDOMEN PELVIS W CONTRAST Result Date: 09/06/2023 CLINICAL DATA:  Left lower quadrant pain EXAM: CT ABDOMEN AND PELVIS WITH CONTRAST TECHNIQUE: Multidetector CT imaging of the abdomen and pelvis was performed using the standard protocol following bolus administration of intravenous contrast. RADIATION DOSE REDUCTION: This exam was performed according to the  departmental dose-optimization program which includes automated exposure control, adjustment of the mA and/or kV according to patient size and/or use of iterative reconstruction technique. CONTRAST:  OMNIPAQUE  IOHEXOL  300 MG/ML  SOLN COMPARISON:  MRI 01/19/2023, CT 11/26/2022 FINDINGS: Lower chest: Lung bases demonstrate no acute airspace disease. Hepatobiliary: Central hepatic cyst. Cholecystectomy. No biliary dilatation Pancreas: No ductal dilatation.  No definitive inflammatory change Spleen: Normal in size without focal abnormality. Adrenals/Urinary Tract: Adrenal glands are normal. Kidneys show no hydronephrosis. The bladder is normal Stomach/Bowel: Stomach is within normal limits. Appendix appears normal. No evidence of bowel wall thickening, distention, or inflammatory changes. Mild diverticular disease of the left colon. Vascular/Lymphatic: Aortic atherosclerosis. No enlarged abdominal or pelvic lymph nodes. Reproductive: Prostate is unremarkable. Other: Negative for pelvic effusion or free air. Small fat containing supraumbilical ventral hernia Musculoskeletal: No acute or suspicious osseous abnormality IMPRESSION: 1.  No CT evidence for acute intra-abdominal or pelvic abnormality. 2. Mild diverticular disease of the left colon without acute inflammatory process. 3. Small fat containing supraumbilical ventral hernia. 4. Aortic atherosclerosis. Aortic Atherosclerosis (ICD10-I70.0). Electronically Signed   By: Luke Bun M.D.   On: 09/06/2023 19:42   DG Chest 2 View Result Date: 09/06/2023 CLINICAL DATA:  Chest pain. EXAM: CHEST - 2 VIEW COMPARISON:  Radiographs 07/21/2022 and 03/02/2014. Cardiac CT 07/25/2019. FINDINGS: The heart size and mediastinal contours are stable. There is stable prominent fat at the right cardiophrenic angle. The lungs are clear. No pleural effusion or pneumothorax. Stable minimal degenerative changes in the spine without acute osseous abnormality. IMPRESSION: No evidence of acute cardiopulmonary process. Electronically Signed   By: Elsie Perone M.D.   On: 09/06/2023 18:25   _______________________________________________________________________________________________________ Latest  Blood pressure (!) 157/106, pulse 91, temperature 97.9 F (36.6 C), temperature source Oral, resp. rate 18, height 5' 8 (1.727 m), weight 106.6 kg, SpO2 96%.   Vitals  labs and radiology finding personally reviewed  Review of Systems:    Pertinent positives include:  abdominal pain, nausea, vomiting,   Constitutional:  No weight loss, night sweats, Fevers, chills, fatigue, weight loss  HEENT:  No headaches, Difficulty swallowing,Tooth/dental problems,Sore throat,  No sneezing, itching, ear ache, nasal congestion, post nasal drip,  Cardio-vascular:  No chest pain, Orthopnea, PND, anasarca, dizziness, palpitations.no Bilateral lower extremity swelling  GI:  No heartburn, indigestion, diarrhea, change in bowel habits, loss of appetite, melena, blood in stool, hematemesis Resp:  no shortness of breath at rest. No dyspnea on exertion, No excess mucus, no productive cough, No non-productive cough, No  coughing up of blood.No change in color of mucus.No wheezing. Skin:  no rash or lesions. No jaundice GU:  no dysuria, change in color of urine, no urgency or frequency. No straining to urinate.  No flank pain.  Musculoskeletal:  No joint pain or no joint swelling. No decreased range of motion. No back pain.  Psych:  No change in mood or affect. No depression or anxiety. No memory loss.  Neuro: no localizing neurological complaints, no tingling, no weakness, no double vision, no gait abnormality, no slurred speech, no confusion  All systems reviewed and apart from HOPI all are negative _______________________________________________________________________________________________ Past Medical History:   Past Medical History:  Diagnosis Date   Abdominal pain, epigastric 09/26/2009   Allergy    possible per pt - no dx    Arthralgia of multiple sites 08/13/2010   CHEST PAIN, ATYPICAL 05/04/2008   Chest wall pain    Chronic rhinitis 09/26/2009   COSTOCHONDRITIS, RECURRENT 05/06/2010  DEGENERATIVE DISC DISEASE, 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   Elevated BP 10/08/2011   Elevated LFTs 06/19/2016   Fatigue 01/08/2011   GERD (gastroesophageal reflux disease)    Hyperglycemia 06/17/2016   Hyperlipidemia 10/08/2010   Kidney stone    NECK PAIN 09/26/2009   Neuromuscular disorder (HCC)    C4C5 herniated disc    Other acute reactions to stress 09/26/2009   Other and unspecified hyperlipidemia 01/23/2013   PARESTHESIA 11/22/2009   Preventative health care 08/01/2012   SHOULDER PAIN, LEFT 07/17/2008   Testosterone  deficiency 07/09/2011   Vitamin D  deficiency 07/08/2011    Past Surgical History:  Procedure Laterality Date   CARDIAC CATHETERIZATION     2013-2014   CHOLECYSTECTOMY N/A 07/23/2022   Procedure: LAPAROSCOPIC CHOLECYSTECTOMY WITH INTRAOPERATIVE CHOLANGIOGRAM;  Surgeon: Curvin Deward MOULD, MD;  Location: [facility] OR;  Service: General;  Laterality: N/A;   UMBILICAL HERNIA REPAIR  2017   UPPER GASTROINTESTINAL  ENDOSCOPY      Social History:  Ambulatory   independently       reports that he has never smoked. He has never used smokeless tobacco. He reports current alcohol use of about 1.0 - 2.0 standard drink of alcohol per week. He reports that he does not use drugs.   Family History:  strong family history of early coronary disease, with his father having had a massive heart attack at age 56.  Family History  Problem Relation Age of Onset   Diabetes Mother    Hypertension Mother    Hyperlipidemia Mother    Heart attack Father 11   Diabetes Maternal Grandmother    Heart disease Maternal Grandmother    Colitis Maternal Grandmother    Colitis Maternal Grandfather    Heart disease Maternal Grandfather    Diabetes Paternal Grandmother    Lupus Cousin    Irritable bowel syndrome Other    Cancer Other        Esophageal- Great Grandfather   Stomach cancer Other        Uncle   Colon polyps Other        Grandmother   Diabetes Other        Family history of   Hypertension Other        Family history of   Heart attack Other        Family history of   Irritable bowel syndrome Other        Family history of   Colon cancer Neg Hx    Esophageal cancer Neg Hx    Rectal cancer Neg Hx    ______________________________________________________________________________________________ Allergies: No Known Allergies   Prior to Admission medications   Medication Sig Start Date End Date Taking? Authorizing Provider  rosuvastatin  (CRESTOR ) 40 MG tablet TAKE 1 TABLET BY MOUTH EVERY DAY 09/01/23   Jeffrie Oneil BROCKS, MD  VITAMIN D  PO Take by mouth. daily    [provider]    ___________________________________________________________________________________________________ Physical Exam:    09/07/2023   12:29 AM 09/06/2023   11:43 PM 09/06/2023   11:10 PM  Vitals with BMI  Systolic 157 131   Diastolic 106 84   Pulse 91 65 67     1. General:  in No  Acute distress    Chronically ill   -appearing 2. Psychological: Alert and   Oriented 3. Head/ENT:    Dry Mucous Membranes                          Head  Non traumatic, neck supple                          Normal  Dentition 4. SKIN:  decreased Skin turgor,  Skin clean Dry and intact no rash    5. Heart: Regular rate and rhythm no  Murmur, no Rub or gallop 6. Lungs:   no wheezes or crackles   7. Abdomen: Soft,  non-tender, Non distended   obese  bowel sounds present 8. Lower extremities: no clubbing, cyanosis, no  edema 9. Neurologically Grossly intact, moving all 4 extremities equally   10. MSK: Normal range of motion    Chart has been reviewed  ______________________________________________________________________________________________  Assessment/Plan  58 y.o. male with medical history significant of CAD, pancreatitis, cholecystectomy, postsurgical hernia, hypertension, hyperlipidemia, obesity     Admitted for  early pancreatis    Present on Admission:  Acute pancreatitis  AKI (acute kidney injury) (HCC)  Essential hypertension  Mixed hyperlipidemia  Class 2 obesity  Coronary artery disease involving native coronary artery of native heart without angina pectoris  Elevated LFTs     Acute pancreatitis Likely early pancreatitis CT unremarkable but lipase is significantly elevated Also noted mild transaminitis Patient is status postcholecystectomy Check lipid panel Given recurrent episode may benefit from GI follow-up Keep n.p.o. Rehydrate aggressively  AKI (acute kidney injury) (HCC)  mild will rehydrate Obtain urine electrolytes  Mixed hyperlipidemia Restart Crestor  when able to tolerate p.o.'s  Class 2 obesity Contributing to comorbidity and complicating medical management  Body mass index is 35.73 kg/m.  Nutritional follow up as an out pt would be recommended   Coronary artery disease involving native coronary artery of native heart without angina pectoris Chronic stable Resume aspirin   and Crestor  when able to tolerate p.o.  Essential hypertension Patient not on BP meds continue to monitor  Elevated LFTs Rehydrate and repeat If persists may need dedicated liver studies such as ultrasound Patient status post cholecystectomy CT scan showing no acute process For completion obtain hepatitis serologies check CK    Other plan as per orders.  DVT prophylaxis:  SCD     Code Status:    Code Status: Prior FULL CODE  as per patient   I had personally discussed CODE STATUS with patient   ACP   none   Family Communication:   Family   at  Bedside  plan of care was discussed  with  Wife   Diet  Diet Orders (From admission, onward)     Start     Ordered   09/06/23 2303  Diet clear liquid Room service appropriate? Yes; Fluid consistency: Thin  Diet effective now       Question Answer Comment  Room service appropriate? Yes   Fluid consistency: Thin      09/06/23 2302            Disposition Plan:      To home once workup is complete and patient is stable   Following barriers for discharge:                                                           Pain controlled with PO medications  Consults called:    NONE, if no clear cause may benefit from GI eval   Admission status:  ED Disposition     ED Disposition  Transfer via POV   Condition  --   Comment  [facility] [[id number]] Level of Care: Telemetry [5] Admit to tele based on following criteria: Other see comments Comments: Close monitoring Interfacility transfer: Yes May place patient in observation at [facility] or [facility] if equivalent level of care is available:: Yes Covid Evaluation: Asymptomatic - no recent exposure (last 10 days) testing not required Diagnosis: Acute pancreatitis [577.0.ICD-9-CM] Admitting Physician: BOBBETTE HERCULES [[id number]] Attending Physician: ARMENTA CANNING [[id number]]           Obs      Level of care      tele  For 12H    Lusia Greis 09/07/2023, 1:04 AM    [facility]     after 2 AM please page floor coverage   If 7AM-7PM, please contact the day team taking care of the patient using [email or web address]

## 2023-09-07 NOTE — Plan of Care (Signed)

## 2023-09-07 NOTE — Assessment & Plan Note (Signed)
 Likely early pancreatitis CT unremarkable but lipase is significantly elevated Also noted mild transaminitis Patient is status postcholecystectomy Check lipid panel Given recurrent episode may benefit from GI follow-up Keep n.p.o. Rehydrate aggressively

## 2023-09-07 NOTE — H&P (View-Only) (Signed)
 Referring Provider: Dr. CANDIE Speaker Primary Care Physician:  Domenica Harlene LABOR, MD Primary Gastroenterologist: Dr. Lynnie Bring  Reason for Consultation: Acute pancreatitis  HPI: Casey Miles is a 58 y.o. male with a past medical history of cervical disc disease, hyperlipidemia, hypertriglyceridemia, mild coronary artery disease per CT 2021, hepatic steatosis, diverticulosis, colon polyps and gallstones s/p cholecystectomy 07/2022.  He presented to the [facility] ED 09/06/2023 with nausea, mid back pain between his shoulder blades which radiated around to his chest and upper abdomen which was similar to the pain he experienced when he had pancreatitis. Labs in the ED showed a WBC count of 8.3.  Hemoglobin 15.1.  Hematocrit 42.6.  Platelet 241.  BUN 16.  Creatinine 1.25 (Cr 1.16 one year ago).  Lipase 2,787.  Repeat lipase > 2,800.  Amylase 1370.  Total bili 0.9.  Direct bili 0.4.  Alk phos 70.  AST 97.  ALT 66.  Calcium  9.3.  Negative chest x-ray.  CTAP with contrast showed a normal pancreas without ductal dilatation, s/p cholecystectomy physiology without biliary ductal dilatation and a central hepatic cyst. A small fat-containing supraumbilical ventral hernia, diverticulosis without diverticulitis and aortic atherosclerosis were also noted.  He was transferred to [facility] for admission and for further evaluation regarding acute pancreatitis.  Labs today: Lipase 220.  Acute hepatitis panel pending.  Total bili 1.4.  Alk phos 53.  AST 76.  ALT 86.  Cholesterol 110.  Triglycerides 133.  CK 162.  Sed rate 3.  CRP pending.  Troponin 3.  He felt well yesterday morning and ate breakfast with his family at 10:30 AM which consisted of eggs, bacon, fried potatoes, biscuits with sausage gravy which she has eaten on many occasions without any GI symptoms. He drank Pepsi at 5 PM and shortly after developed significant pain to his mid back between his shoulder blades which radiated around to his  chest and RUQ and LUQ. He described feeling as if a small pig was on his chest. He had difficulty taking in a deep breath in. He developed intense sweats with worsening back and abdominal pain and was on his knees in the ED then his symptoms abruptly diminished. Since then, he continues to have low level mid back pain. His mid back pain is worse when he lays on his back and improves when he turns on his side or sits up. No further nausea. No vomiting.  He currently rates his back pain a 1 on scale 1-10. No alcohol use for the past 6 to 9 months. Prior to that, he drank 2-3 alcoholic beverages every few weeks and noted drinking heavily in his 20s and 30s. No new medications within the past 6 months. No known family history of pancreatic disease.  No bloody or black stools.  He was previously seen by our inpatient GI service 07/22/2018 for secondary to having intermittent biliary colic symptoms admitted with acute pancreatitis. MRCP was negative for choledocholithiasis and he subsequently underwent a laparoscopic cholecystectomy with negative IOC 07/23/2022.  Path report showed chronic cholecystitis.  GI PROCEDURES:  Colonoscopy 07/07/2022: - One 4 mm polyp in the proximal ascending colon, removed with a cold snare. Resected and retrieved.  - One 8 mm polyp in the proximal sigmoid colon, removed with a hot snare. Resected and retrieved.  - Mild sigmoid diverticulosis  - Non-bleeding internal hemorrhoids. - 5 year recall colonoscopy  1. Surgical [P], colon, ascending polyp x 1, polyp (1) - SESSILE SERRATED POLYP WITHOUT  CYTOLOGIC DYSPLASIA 2. Surgical [P], colon, sigmoid polyp x 1, polyp (1) - TUBULAR ADENOMA WITHOUT HIGH-GRADE DYSPLASIA OR MALIGNANCY  Colonoscopy 05/24/2019: - A 12 mm polyp was found in the distal sigmoid colon, 25 cm from the anal verge. The polyp was semi-pedunculated with a thick pedicle. The polyp was removed with a hot snare. Resection and retrieval were complete.  - A few rare  small-mouthed diverticula were found in the sigmoid colon. - The terminal ileum appeared normal.  - The exam was otherwise without abnormality on direct and retroflexion views.  - Non-bleeding internal hemorrhoids were found during retroflexion. The hemorrhoids were small.  EGD 10/31/2009: Normal EGD Ectopic gastric mucosa in the proximal esophagus  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   Elevated BP 10/08/2011   Elevated LFTs 06/19/2016   Fatigue 01/08/2011   GERD (gastroesophageal reflux disease)    Hyperglycemia 06/17/2016   Hyperlipidemia 10/08/2010   Kidney stone    NECK PAIN 09/26/2009   Neuromuscular disorder (HCC)    C4C5 herniated disc    Other acute reactions to stress 09/26/2009   Other and unspecified hyperlipidemia 01/23/2013   PARESTHESIA 11/22/2009   Preventative health care 08/01/2012   SHOULDER PAIN, LEFT 07/17/2008   Testosterone  deficiency 07/09/2011   Vitamin D  deficiency 07/08/2011    Past Surgical History:  Procedure Laterality Date   CARDIAC CATHETERIZATION     2013-2014   CHOLECYSTECTOMY N/A 07/23/2022   Procedure: LAPAROSCOPIC CHOLECYSTECTOMY WITH INTRAOPERATIVE CHOLANGIOGRAM;  Surgeon: Curvin Deward MOULD, MD;  [facility]: [facility] OR;  Service: General;  Laterality: N/A;   UMBILICAL HERNIA REPAIR  2017   UPPER GASTROINTESTINAL ENDOSCOPY      Prior to Admission medications   Medication Sig Start Date End Date Taking? Authorizing Provider  rosuvastatin  (CRESTOR ) 40 MG tablet TAKE 1 TABLET BY MOUTH EVERY DAY 09/01/23  Yes Jeffrie Oneil BROCKS, MD    Current Facility-Administered Medications  Medication Dose Route Frequency Provider Last Rate Last Admin   acetaminophen  (TYLENOL ) tablet 650 mg   650 mg Oral Q6H PRN Doutova, Anastassia, MD       Or   acetaminophen  (TYLENOL ) suppository 650 mg  650 mg Rectal Q6H PRN Doutova, Anastassia, MD       HYDROcodone -acetaminophen  (NORCO/VICODIN) 5-325 MG per tablet 1-2 tablet  1-2 tablet Oral Q4H PRN Doutova, Anastassia, MD       lactated ringers  infusion   Intravenous Continuous Doutova, Anastassia, MD 150 mL/hr at 09/07/23 0735 New Bag at 09/07/23 0735   metoCLOPramide  (REGLAN ) injection 5 mg  5 mg Intravenous Q6H PRN Doutova, Anastassia, MD       morphine  (PF) 2 MG/ML injection 1-2 mg  1-2 mg Intravenous Q2H PRN Doutova, Anastassia, MD       ondansetron  (ZOFRAN ) injection 4 mg  4 mg Intravenous Q6H PRN Doutova, Anastassia, MD        Allergies as of 09/06/2023   (No Known Allergies)    Family History  Problem Relation Age of Onset   Diabetes Mother    Hypertension Mother    Hyperlipidemia Mother    Heart attack Father 18   Diabetes Maternal Grandmother    Heart disease Maternal Grandmother  Colitis Maternal Grandmother    Colitis Maternal Grandfather    Heart disease Maternal Grandfather    Diabetes Paternal Grandmother    Lupus Cousin    Irritable bowel syndrome Other    Cancer Other        Esophageal- Great Grandfather   Stomach cancer Other        Uncle   Colon polyps Other        Grandmother   Diabetes Other        Family history of   Hypertension Other        Family history of   Heart attack Other        Family history of   Irritable bowel syndrome Other        Family history of   Colon cancer Neg Hx    Esophageal cancer Neg Hx    Rectal cancer Neg Hx     Social History   Socioeconomic History   Marital status: Married    Spouse name: Not on file   Number of children: Not on file   Years of education: Not on file   Highest education level: Not on file  Occupational History   Not on file  Tobacco Use   Smoking status: Never   Smokeless tobacco: Never  Vaping Use   Vaping status: Never Used   Substance and Sexual Activity   Alcohol use: Yes    Alcohol/week: 1.0 - 2.0 standard drink of alcohol    Types: 1 - 2 Standard drinks or equivalent per week    Comment: 1-2 per week   Drug use: No   Sexual activity: Yes    Comment: lives wife and children, exercises intermittently, heart healthy diet.  Other Topics Concern   Not on file  Social History Narrative   Not on file   Social Drivers of Health   Financial Resource Strain: Not on file  Food Insecurity: No Food Insecurity (09/07/2023)   Hunger Vital Sign    Worried About Running Out of Food in the Last Year: Never true    Ran Out of Food in the Last Year: Never true  Transportation Needs: No Transportation Needs (09/07/2023)   PRAPARE - Administrator, Civil Service (Medical): No    Lack of Transportation (Non-Medical): No  Physical Activity: Not on file  Stress: Not on file  Social Connections: Unknown (07/14/2021)   Received from [facility]   Social Network    Social Network: Not on file  Intimate Partner Violence: Not At Risk (09/07/2023)   Humiliation, Afraid, Rape, and Kick questionnaire    Fear of Current or Ex-Partner: No    Emotionally Abused: No    Physically Abused: No    Sexually Abused: No   Review of Systems: Gen: Denies fever, sweats or chills. No weight loss.  CV: Denies chest pain, palpitations or edema. Resp: Denies cough, shortness of breath of hemoptysis.  GI:See HPI.  No GERD symptoms. GU : Denies urinary burning, blood in urine, increased urinary frequency or incontinence. MS: + Back pain. Derm: Denies rash, itchiness, skin lesions or unhealing ulcers. Psych: Denies depression, anxiety, memory loss or confusion. Heme: Denies easy bruising, bleeding. Neuro:  Denies headaches, dizziness or paresthesias. Endo:  Denies any problems with DM, thyroid  or adrenal function.  Physical Exam: Vital signs in last 24 hours: Temp:  [97.9 F (36.6 C)-98.6 F (37 C)] 98.1 F (36.7 C) (07/07  0825) Pulse Rate:  [60-91] 72 (07/07 0825) Resp:  [  12-24] 16 (07/07 0825) BP: (113-157)/(77-106) 154/100 (07/07 0825) SpO2:  [94 %-99 %] 99 % (07/07 0825) Weight:  [106.6 kg] 106.6 kg (07/06 1743) Last BM Date : 09/06/23 General:  Alert, well-developed, well-nourished, pleasant and cooperative in NAD. Head:  Normocephalic and atraumatic. Eyes:  No scleral icterus. Conjunctiva pink. Ears:  Normal auditory acuity. Nose:  No deformity, discharge or lesions. Mouth:  Dentition intact. No ulcers or lesions.  Neck:  Supple. No lymphadenopathy or thyromegaly.  Lungs: Breath sounds clear throughout. No wheezes, rhonchi or crackles.  Heart: Regular rate and rhythm.  No murmurs. Abdomen: Soft, nondistended.  Nontender.  Supraumbilical/ventral hernia present.  Positive bowel sounds all 4 quadrants.  No hepatosplenomegaly. Rectal: Deferred. Musculoskeletal:  Symmetrical without gross deformities.  Pulses:  Normal pulses noted. Extremities:  Without clubbing or edema. Neurologic:  Alert and  oriented x 4. No focal deficits.  Skin:  Intact without significant lesions or rashes. Psych:  Alert and cooperative. Normal mood and affect.  Intake/Output from previous day: 07/06 0701 - 07/07 0700 In: 1658.4 [I.V.:657.7; IV Piggyback:1000.6] Out: -  Intake/Output this shift: No intake/output data recorded.  Lab Results: Recent Labs    09/06/23 1755 09/07/23 0806  WBC 8.3 8.2  HGB 15.1 14.0  HCT 42.6 40.7  PLT 241 169   BMET Recent Labs    09/06/23 1755 09/07/23 0806  NA 142 138  K 3.9 4.2  CL 104 105  CO2 23 26  GLUCOSE 116* 102*  BUN 16 14  CREATININE 1.25* 1.01  CALCIUM  9.3 8.7*   LFT Recent Labs    09/06/23 1755 09/07/23 0806  PROT 7.2 6.3*  ALBUMIN 4.6 3.6  AST 97* 76*  ALT 66* 86*  ALKPHOS 70 53  BILITOT 0.9 1.4*  BILIDIR 0.4*  --   IBILI 0.5  --    PT/INR No results for input(s): LABPROT, INR in the last 72 hours. Hepatitis Panel No results for input(s):  HEPBSAG, HCVAB, HEPAIGM, HEPBIGM in the last 72 hours.    Studies/Results: CT ABDOMEN PELVIS W CONTRAST Result Date: 09/06/2023 CLINICAL DATA:  Left lower quadrant pain EXAM: CT ABDOMEN AND PELVIS WITH CONTRAST TECHNIQUE: Multidetector CT imaging of the abdomen and pelvis was performed using the standard protocol following bolus administration of intravenous contrast. RADIATION DOSE REDUCTION: This exam was performed according to the departmental dose-optimization program which includes automated exposure control, adjustment of the mA and/or kV according to patient size and/or use of iterative reconstruction technique. CONTRAST:  OMNIPAQUE  IOHEXOL  300 MG/ML  SOLN COMPARISON:  MRI 01/19/2023, CT 11/26/2022 FINDINGS: Lower chest: Lung bases demonstrate no acute airspace disease. Hepatobiliary: Central hepatic cyst. Cholecystectomy. No biliary dilatation Pancreas: No ductal dilatation.  No definitive inflammatory change Spleen: Normal in size without focal abnormality. Adrenals/Urinary Tract: Adrenal glands are normal. Kidneys show no hydronephrosis. The bladder is normal Stomach/Bowel: Stomach is within normal limits. Appendix appears normal. No evidence of bowel wall thickening, distention, or inflammatory changes. Mild diverticular disease of the left colon. Vascular/Lymphatic: Aortic atherosclerosis. No enlarged abdominal or pelvic lymph nodes. Reproductive: Prostate is unremarkable. Other: Negative for pelvic effusion or free air. Small fat containing supraumbilical ventral hernia Musculoskeletal: No acute or suspicious osseous abnormality IMPRESSION: 1. No CT evidence for acute intra-abdominal or pelvic abnormality. 2. Mild diverticular disease of the left colon without acute inflammatory process. 3. Small fat containing supraumbilical ventral hernia. 4. Aortic atherosclerosis. Aortic Atherosclerosis (ICD10-I70.0). Electronically Signed   By: Luke Bun M.D.   On: 09/06/2023 19:42  DG  Chest 2 View Result Date: 09/06/2023 CLINICAL DATA:  Chest pain. EXAM: CHEST - 2 VIEW COMPARISON:  Radiographs 07/21/2022 and 03/02/2014. Cardiac CT 07/25/2019. FINDINGS: The heart size and mediastinal contours are stable. There is stable prominent fat at the right cardiophrenic angle. The lungs are clear. No pleural effusion or pneumothorax. Stable minimal degenerative changes in the spine without acute osseous abnormality. IMPRESSION: No evidence of acute cardiopulmonary process. Electronically Signed   By: Elsie Perone M.D.   On: 09/06/2023 18:25    IMPRESSION/PLAN:  58 year old male with a history of gallstone pancreatitis status post laparoscopic cholecystectomy 07/23/2022 who presents with mid back pain between the shoulder bladed, upper abdominal pain, acute pancreatitis and elevated LFTs. CTAP without showed a normal pancreas without evidence of biliary ductal dilatation or choledocholithiasis. I suspect he possibly had a CBD stone which passed spontaneously.  Admission lipase 2, 787 -> > 2,800 -> 220. T. Bili 0.9 -> 1.4. Alk phos 53. AST 97 -> 76. ALT 66 -> 86.  Normal calcium  and triglyceride levels.  Afebrile.  No alcohol x 6 to 9 months.  Hemodynamically stable. -Consider abdominal MRI/MRCP during this hospitalization, to discuss further with Dr. Wilhelmenia -Add direct bili to a.m. lab draw -CBC and BMP at 2pm -IV fluids and pain meds per the hospitalist -Ondansetron  4 mg PO or IV every 6 hours as needed -Clear liquid diet  Hepatic steatosis - Follow-up as an outpatient  History of colon polyps.  Colonoscopy 07/2022 identified 2 sessile serrated/tubular adenomatous polyps removed from the colon. - Next colon polyp surveillance colonoscopy due 07/2027  Supraumbilical ventral hernia - Follow-up with general surgery as an outpatient  History of CAD with coronary calcium  score 31 per CT 2021  Elida CHRISTELLA Shawl  09/07/2023, 10:33 AM

## 2023-09-07 NOTE — Subjective & Objective (Signed)
 Patient started to have back pain radiating to abdomen chest with nausea has history of pancreatitis in the past and felt similar to that No associated cough or fever Last year he had a gallstone induced pancreatitis and underwent a cholecystectomy at that time Patient presented to [facility] found to have a lipase of 2 oh thousand 787

## 2023-09-07 NOTE — Progress Notes (Signed)
 PROGRESS NOTE  Casey Miles  DOB: October 01, 1965  PCP: Domenica Harlene LABOR, MD FMW:[id number]  DOA: 09/06/2023  LOS: 0 days  Hospital Day: 2  Brief narrative: Casey Miles is a 58 y.o. male with PMH significant for hypertriglyceridemia, CAD, h/o gallstone pancreatitis s/p cholecystectomy 07/2022, hepatic steatosis, diverticulosis, cervical disc disease  7/6, patient presented to ED at [facility] with complaint of nausea, back pain, abdominal pain, symptoms similar to past experience with pancreatitis.  In the ED, patient was hemodynamically stable Labs showed CBC unremarkable, lipase level over 2700, amylase level over 1300, AST, ALT mildly elevated, alk phos, total bili normal, BUN/creatinine 16/1.25 CT abdomen pelvis did not show any acute pathology.  It showed normal pancreas without ductal dilatation, cholecystectomy status with out biliary duct dilatation and a central hepatic cyst, mild diverticular disease of the left colon without acute inflammatory process.  Started on conservative management of pancreatitis with IV fluid, bowel rest Transferred to [facility] for observation under [facility] GI was consulted  Subjective: Patient was seen and examined this morning. Blood pressure running over 150s today Labs from this morning with lipase level drastically down to 220 Serum osmolality 299, urine osmolality 914  Assessment and plan: Acute pancreatitis H/o gallstone pancreatitis Presented with sudden onset abdominal pain CT abdomen with normal pancreas without evidence of biliary ductal dilatation But lipase and amylase level were significantly elevated without rising alk phos or direct bili Normal calcium  and triglyceride level. Was started on conservative management with IV fluid, bowel rest, IV antiemetics, IV analgesics Platelet level significantly down today. GI following. Clear liquid diet for now  AKI Creatinine was elevated 1.25 on admission. Improved with IV  fluid. Recent Labs    09/09/22 1537 09/06/23 1755 09/07/23 0806 09/07/23 1326  BUN 14 16 14 13   CREATININE 1.16 1.25* 1.01 1.07   Essential hypertension Blood pressure elevated to 150s this morning.  Not on scheduled home BP meds IV hydralazine  as needed  H/o hypertriglyceridemia Triglyceride level normal at 133 this time.  H/o CAD Coronary calcium  score 31 per CT scan 2021 Plan to resume Crestor  when able to tolerate oral intake.  Patient states he is not on aspirin   Hepatic steatosis Likely due to high triglyceride level  Acute hepatitis panel unremarkable. follow-up as an outpatient Recent Labs  Lab 09/06/23 1755 09/06/23 2014 09/07/23 0806 09/07/23 1326  AST 97*  --  76*  --   ALT 66*  --  86*  --   ALKPHOS 70  --  53  --   BILITOT 0.9  --  1.4*  --   BILIDIR 0.4*  --   --   --   IBILI 0.5  --   --   --   PROT 7.2  --  6.3*  --   ALBUMIN 4.6  --  3.6  --   LIPASE 2,787* >2,800* 220*  --   PLT 241  --  169 165   H/o colon polyps   Colonoscopy 07/2022 identified 2 sessile serrated/tubular adenomatous polyps removed from the colon. Next colon polyp surveillance colonoscopy due 07/2027 2 follow-up with GI as an outpatient   Supraumbilical ventral hernia Follow-up with general surgery as an outpatient  Morbid Obesity  Body mass index is 35.73 kg/m. Patient has been advised to make an attempt to improve diet and exercise patterns to aid in weight loss.  Mobility: Encourage ambulation  Goals of care   Code Status: Full Code  DVT prophylaxis:  SCDs Start: 09/07/23 0106   Antimicrobials: None Fluid: Currently on LR at 150 mL/h Consultants: GI Family Communication: None at bedside  Status: Observation Level of care:  Telemetry   Patient is from: Home Needs to continue in-hospital care: Ongoing conservative management of pancreatitis Anticipated d/c to: Home in 1 to 2 days    Diet:  Diet Order             Diet clear liquid Room service  appropriate? Yes; Fluid consistency: Thin  Diet effective now                   Scheduled Meds:   PRN meds: acetaminophen  **OR** acetaminophen , HYDROcodone -acetaminophen , metoCLOPramide  (REGLAN ) injection, morphine  injection, ondansetron  (ZOFRAN ) IV   Infusions:   lactated ringers  150 mL/hr at 09/07/23 1436    Antimicrobials: Anti-infectives (From admission, onward)    None       Objective: Vitals:   09/07/23 0825 09/07/23 1228  BP: (!) 154/100 (!) 158/94  Pulse: 72 67  Resp: 16   Temp: 98.1 F (36.7 C) (!) 97.4 F (36.3 C)  SpO2: 99% 98%    Intake/Output Summary (Last 24 hours) at 09/07/2023 1527 Last data filed at 09/07/2023 1504 Gross per 24 hour  Intake 3031.24 ml  Output --  Net 3031.24 ml   Filed Weights   09/06/23 1743  Weight: 106.6 kg   Weight change:  Body mass index is 35.73 kg/m.   Physical Exam: General exam: Pleasant, middle-aged Caucasian male.  Not in distress Skin: No rashes, lesions or ulcers. HEENT: Atraumatic, normocephalic, no obvious bleeding Lungs: Clear to auscultation bilaterally,  CVS: S1, S2, no murmur,   GI/Abd: Soft, mild epigastric tenderness, nondistended, ventral hernia noted.  Bowel sound present,   CNS: Alert, awake, oriented x 3 Psychiatry: Mood appropriate,  Extremities: No pedal edema, no calf tenderness,   Data Review: I have personally reviewed the laboratory data and studies available.  F/u labs ordered Unresulted Labs (From admission, onward)     Start     Ordered   09/07/23 0712  CK  Once,   R        09/07/23 0712   09/07/23 0712  Comprehensive metabolic panel with GFR  Once,   R        09/07/23 0712   09/07/23 0712  Magnesium  Once,   R        09/07/23 0712   09/07/23 0712  Phosphorus  Once,   R        09/07/23 9287           Signed, Chapman Rota, MD [facility] 09/07/2023

## 2023-09-07 NOTE — Progress Notes (Signed)
 Pt arrived from [facility] transported by his wife. IV was intact on arrival, flushed without difficulty. PT is A&O x4, independent and ambulatory,  denies pain. Urine sample obtained. POC reviewed with pt and wife to their apparent understanding. IVF infusing, call light within pt's reach.

## 2023-09-08 ENCOUNTER — Encounter (HOSPITAL_COMMUNITY)
Admission: EM | Disposition: A | Discharge status: Home / Self Care | Payer: Self-pay | Source: Home / Self Care | Attending: Internal Medicine

## 2023-09-08 ENCOUNTER — Encounter (HOSPITAL_COMMUNITY): Payer: Self-pay | Admitting: *Deleted

## 2023-09-08 ENCOUNTER — Inpatient Hospital Stay (HOSPITAL_COMMUNITY): Admitting: Certified Registered Nurse Anesthetist

## 2023-09-08 DIAGNOSIS — K859 Acute pancreatitis without necrosis or infection, unspecified: Secondary | ICD-10-CM | POA: Diagnosis not present

## 2023-09-08 DIAGNOSIS — K869 Disease of pancreas, unspecified: Secondary | ICD-10-CM

## 2023-09-08 DIAGNOSIS — I899 Noninfective disorder of lymphatic vessels and lymph nodes, unspecified: Secondary | ICD-10-CM

## 2023-09-08 DIAGNOSIS — K3189 Other diseases of stomach and duodenum: Secondary | ICD-10-CM

## 2023-09-08 DIAGNOSIS — K2289 Other specified disease of esophagus: Secondary | ICD-10-CM

## 2023-09-08 DIAGNOSIS — K2970 Gastritis, unspecified, without bleeding: Secondary | ICD-10-CM

## 2023-09-08 HISTORY — PX: ESOPHAGOGASTRODUODENOSCOPY: SHX5428

## 2023-09-08 HISTORY — PX: EUS: SHX5427

## 2023-09-08 LAB — COMPREHENSIVE METABOLIC PANEL WITH GFR
ALT: 66 U/L — ABNORMAL HIGH (ref 0–44)
AST: 42 U/L — ABNORMAL HIGH (ref 15–41)
Albumin: 4 g/dL (ref 3.5–5.0)
Alkaline Phosphatase: 62 U/L (ref 38–126)
Anion gap: 11 (ref 5–15)
BUN: 11 mg/dL (ref 6–20)
CO2: 27 mmol/L (ref 22–32)
Calcium: 9.3 mg/dL (ref 8.9–10.3)
Chloride: 102 mmol/L (ref 98–111)
Creatinine, Ser: 1.12 mg/dL (ref 0.61–1.24)
GFR, Estimated: >60 mL/min (ref >60)
Glucose, Bld: 108 mg/dL — ABNORMAL HIGH (ref 70–99)
Potassium: 4.2 mmol/L (ref 3.5–5.1)
Sodium: 140 mmol/L (ref 135–145)
Total Bilirubin: 1.3 mg/dL — ABNORMAL HIGH (ref 0.0–1.2)
Total Protein: 7.2 g/dL (ref 6.5–8.1)

## 2023-09-08 LAB — LIPASE, BLOOD: Lipase: 48 U/L (ref 11–51)

## 2023-09-08 SURGERY — ULTRASOUND, UPPER GI TRACT, ENDOSCOPIC
Anesthesia: Monitor Anesthesia Care

## 2023-09-08 MED ORDER — GLYCOPYRROLATE PF 0.2 MG/ML IJ SOSY
PREFILLED_SYRINGE | INTRAMUSCULAR | Status: DC | PRN
  Administered 2023-09-08: .2 mg via INTRAVENOUS

## 2023-09-08 MED ORDER — FENTANYL CITRATE (PF) 100 MCG/2ML IJ SOLN
INTRAMUSCULAR | Status: DC | PRN
  Administered 2023-09-08: 50 ug via INTRAVENOUS

## 2023-09-08 MED ORDER — SODIUM CHLORIDE 0.9 % IV SOLN: INTRAVENOUS | Status: DC | PRN

## 2023-09-08 MED ORDER — PROPOFOL 1000 MG/100ML IV EMUL
INTRAVENOUS | Status: AC
  Filled 2023-09-08: qty 100

## 2023-09-08 MED ORDER — PROPOFOL 10 MG/ML IV BOLUS
INTRAVENOUS | Status: DC | PRN
  Administered 2023-09-08: 50 mg via INTRAVENOUS

## 2023-09-08 MED ORDER — SODIUM CHLORIDE 0.9 % IV SOLN: INTRAVENOUS | Status: DC

## 2023-09-08 MED ORDER — PROPOFOL 500 MG/50ML IV EMUL
INTRAVENOUS | Status: DC | PRN
  Administered 2023-09-08: 130 ug/kg/min via INTRAVENOUS

## 2023-09-08 MED ORDER — LIDOCAINE 2% (20 MG/ML) 5 ML SYRINGE
INTRAMUSCULAR | Status: DC | PRN
Start: 2023-09-08 — End: 2023-09-08
  Administered 2023-09-08: 100 mg via INTRAVENOUS

## 2023-09-08 MED ORDER — PHENYLEPHRINE 80 MCG/ML (10ML) SYRINGE FOR IV PUSH (FOR BLOOD PRESSURE SUPPORT)
PREFILLED_SYRINGE | INTRAVENOUS | Status: DC | PRN
  Administered 2023-09-08: 80 ug via INTRAVENOUS

## 2023-09-08 MED ORDER — FENTANYL CITRATE (PF) 100 MCG/2ML IJ SOLN
INTRAMUSCULAR | Status: AC
  Filled 2023-09-08: qty 2

## 2023-09-08 NOTE — Discharge Summary (Signed)
 Physician Discharge Summary  DAEMION Miles FMW:[id number] DOB: Oct 16, 1965 DOA: 09/06/2023  PCP: Domenica Harlene LABOR, MD  Admit date: 09/06/2023 Discharge date: 09/08/2023  Admitted From: Home Discharge disposition: Home  Recommendations at discharge:  Follow-up with GI as an outpatient Continue to monitor your blood pressure at home.  Follow-up with PCP as an outpatient for probable need of antihypertensives.   Brief narrative: Casey Miles is a 58 y.o. male with PMH significant for hypertriglyceridemia, CAD, h/o gallstone pancreatitis s/p cholecystectomy 07/2022, hepatic steatosis, diverticulosis, cervical disc disease  7/6, patient presented to ED at [facility] with complaint of nausea, back pain, abdominal pain, symptoms similar to past experience with pancreatitis.  In the ED, patient was hemodynamically stable Labs showed CBC unremarkable, lipase level over 2700, amylase level over 1300, AST, ALT mildly elevated, alk phos, total bili normal, BUN/creatinine 16/1.25 CT abdomen pelvis did not show any acute pathology.  It showed normal pancreas without ductal dilatation, cholecystectomy status with out biliary duct dilatation and a central hepatic cyst, mild diverticular disease of the left colon without acute inflammatory process.  Started on conservative management of pancreatitis with IV fluid, bowel rest Transferred to [facility] for observation under [facility] GI was consulted  Subjective: Patient was seen and examined this morning.  Patient was being wheeled down for EUS. Met with wife Ms. Leita at bedside.  Discussed about improving LFTs, lipase level. Blood pressure in 140s mostly.. Underwent EUS.  Able to tolerate diet after that.  Discharged home per GI recommendation  Hospital course: Acute pancreatitis H/o gallstone pancreatitis Presented with sudden onset abdominal pain CT abdomen with normal pancreas without evidence of biliary ductal dilatation But lipase and  amylase level were significantly elevated without rising alk phos or direct bili Were normal.  Calcium  and triglyceride level were normal. Patient was started on conservative management with IV fluid, bowel rest, IV antiemetics, IV analgesics Lipase level significantly improved.  Down to normal today. Underwent EUS by GI today 7/8. Recent Labs  Lab 09/06/23 1755 09/06/23 2014 09/07/23 0806 09/08/23 0910  LIPASE 2,787* >2,800* 220* 48  AMYLASE 1,370*  --   --   --    Hepatic steatosis Likely due to high triglyceride level  Acute hepatitis panel unremarkable. follow-up as an outpatient Recent Labs  Lab 09/06/23 1755 09/06/23 2014 09/07/23 0806 09/07/23 1300 09/07/23 1326 09/07/23 1530 09/08/23 0910  AST 97*  --  76* 63*  --   --  42*  ALT 66*  --  86* 79*  --   --  66*  ALKPHOS 70  --  53 55  --   --  62  BILITOT 0.9  --  1.4* 1.1  --   --  1.3*  BILIDIR 0.4*  --   --   --   --  0.2  --   IBILI 0.5  --   --   --   --   --   --   PROT 7.2  --  6.3* 6.3*  --   --  7.2  ALBUMIN 4.6  --  3.6 3.5  --   --  4.0  LIPASE 2,787* >2,800* 220*  --   --   --  48  PLT 241  --  169  --  165  --   --    AKI Creatinine was elevated 1.25 on admission. Improved with IV fluid. Recent Labs    09/09/22 1537 09/06/23 1755 09/07/23 0806 09/07/23 1300  09/08/23 0910  BUN 14 16 14 12 11   CREATININE 1.16 1.25* 1.01 0.97 1.12  CO2 29 23 26 25 27    Essential hypertension Patient reports he has 'whitecoat syndrome'.  Blood pressure recently as outpatient was normal.  I discussed with patient and his wife about his blood pressure being elevated most to the 150s in the last 24 hours.  They would like to wait before committing to any antihypertensives.  Recommended to follow-up with PCP as an outpatient  H/o hypertriglyceridemia Triglyceride level normal at 133 this time.  H/o CAD Coronary calcium  score 31 per CT scan 2021 Plan to resume Crestor  when able to tolerate oral intake.  Patient  states he is not on aspirin .  H/o colon polyps   Colonoscopy 07/2022 identified 2 sessile serrated/tubular adenomatous polyps removed from the colon. Next colon polyp surveillance colonoscopy due 07/2027 2 follow-up with GI as an outpatient   Supraumbilical ventral hernia Follow-up with general surgery as an outpatient  Morbid Obesity  Body mass index is 35.73 kg/m. Patient has been advised to make an attempt to improve diet and exercise patterns to aid in weight loss.  Mobility: Encourage ambulation  Goals of care   Code Status: Prior   Diet:  Diet Order             Diet general                   Nutritional status:  Body mass index is 35.73 kg/m.       Wounds:  - Incision - 4 Ports Abdomen 1: Umbilicus 2: Mid;Upper 3: Right;Lateral 4: Right;Medial (Active)  Placement Date/Time: 07/23/22 1025   Location of Ports: Abdomen  Port: 1:  Location Orientation: Umbilicus  Port: 2:  Location Orientation: Mid;Upper  Port: 3:  Location Orientation: Right;Lateral  Port: 4:  Location Orientation: Right;Medial    Assessments 07/23/2022 11:06 AM 07/23/2022  7:28 PM  Port 1 Site Assessment Clean;Dry Clean;Dry  Port 1 Margins Attached edges (approximated) --  Port 1 Drainage Amount None --  Port 1 Dressing Type Liquid skin adhesive --  Port 1 Dressing Status Clean, Dry, Intact --  Port 2 Site Assessment Clean;Dry --  Port 2 Margins Attached edges (approximated) --  Port 2 Drainage Amount None --  Port 2 Dressing Type Liquid skin adhesive --  Port 2 Dressing Status Clean, Dry, Intact --  Port 3 Site Assessment Clean;Dry --  Port 3 Margins Attached edges (approximated) --  Port 3 Drainage Amount None --  Port 3 Dressing Type Liquid skin adhesive --  Port 3 Dressing Status Clean, Dry, Intact --  Port 4 Site Assessment Clean;Dry --  Port 4 Margins Attached edges (approximated) --  Port 4 Drainage Amount None --  Port 4 Dressing Type Liquid skin adhesive --  Port 4 Dressing  Status Clean, Dry, Intact --     No associated orders.    Discharge Exam:   Vitals:   09/08/23 1310 09/08/23 1320 09/08/23 1330 09/08/23 1352  BP: (!) 96/56 114/77 126/78 118/83  Pulse: 73 68 68 85  Resp: (!) 22 (!) 24 17   Temp:    98.1 F (36.7 C)  TempSrc:      SpO2: 94% 95% 94% 95%  Weight:      Height:        Body mass index is 35.73 kg/m.   General exam: Pleasant, middle-aged Caucasian male.  Not in distress Skin: No rashes, lesions or ulcers. HEENT: Atraumatic, normocephalic,  no obvious bleeding Lungs: Clear to auscultation bilaterally,  CVS: S1, S2, no murmur,   GI/Abd: Soft, mild epigastric tenderness, nondistended, ventral hernia noted.  Bowel sound present,   CNS: Alert, awake, oriented x 3 Psychiatry: Mood appropriate,  Extremities: No pedal edema, no calf tenderness,   Follow ups:    Follow-up Information     Domenica Harlene LABOR, MD Follow up.   Specialty: Family Medicine Contact information: [address] [place] [address] [phone]                 Discharge Instructions:   Discharge Instructions     Call MD for:  difficulty breathing, headache or visual disturbances   Complete by: As directed    Call MD for:  extreme fatigue   Complete by: As directed    Call MD for:  hives   Complete by: As directed    Call MD for:  persistant dizziness or light-headedness   Complete by: As directed    Call MD for:  persistant nausea and vomiting   Complete by: As directed    Call MD for:  severe uncontrolled pain   Complete by: As directed    Call MD for:  temperature >100.4   Complete by: As directed    Diet general   Complete by: As directed    Discharge instructions   Complete by: As directed    General discharge instructions: Follow with Primary MD Domenica Harlene LABOR, MD in 7 days  Please request your PCP  to go over your hospital tests, procedures, radiology results at the follow up. Please get your medicines reviewed  and adjusted.  Your PCP may decide to repeat certain labs or tests as needed. Do not drive, operate heavy machinery, perform activities at heights, swimming or participation in water activities or provide baby sitting services if your were admitted for syncope or siezures until you have seen by Primary MD or a Neurologist and advised to do so again. [place]  Controlled Substance Reporting System database was reviewed. Do not drive, operate heavy machinery, perform activities at heights, swim, participate in water activities or provide baby-sitting services while on medications for pain, sleep and mood until your outpatient physician has reevaluated you and advised to do so again.  You are strongly recommended to comply with the dose, frequency and duration of prescribed medications. Activity: As tolerated with Full fall precautions use walker/cane & assistance as needed Avoid using any recreational substances like cigarette, tobacco, alcohol, or non-prescribed drug. If you experience worsening of your admission symptoms, develop shortness of breath, life threatening emergency, suicidal or homicidal thoughts you must seek medical attention immediately by calling 911 or calling your MD immediately  if symptoms less severe. You must read complete instructions/literature along with all the possible adverse reactions/side effects for all the medicines you take and that have been prescribed to you. Take any new medicine only after you have completely understood and accepted all the possible adverse reactions/side effects.  Wear Seat belts while driving. You were cared for by a hospitalist during your hospital stay. If you have any questions about your discharge medications or the care you received while you were in the hospital after you are discharged, you can call the unit and ask to speak with the hospitalist or the covering physician. Once you are discharged, your primary care physician will handle any  further medical issues. Please note that NO REFILLS for any discharge medications will be  authorized once you are discharged, as it is imperative that you return to your primary care physician (or establish a relationship with a primary care physician if you do not have one).   Increase activity slowly   Complete by: As directed        Discharge Medications:   Allergies as of 09/08/2023   No Known Allergies      Medication List     TAKE these medications    rosuvastatin  40 MG tablet Commonly known as: CRESTOR  TAKE 1 TABLET BY MOUTH EVERY DAY         The results of significant diagnostics from this hospitalization (including imaging, microbiology, ancillary and laboratory) are listed below for reference.    Procedures and Diagnostic Studies:   CT ABDOMEN PELVIS W CONTRAST Result Date: 09/06/2023 CLINICAL DATA:  Left lower quadrant pain EXAM: CT ABDOMEN AND PELVIS WITH CONTRAST TECHNIQUE: Multidetector CT imaging of the abdomen and pelvis was performed using the standard protocol following bolus administration of intravenous contrast. RADIATION DOSE REDUCTION: This exam was performed according to the departmental dose-optimization program which includes automated exposure control, adjustment of the mA and/or kV according to patient size and/or use of iterative reconstruction technique. CONTRAST:  OMNIPAQUE  IOHEXOL  300 MG/ML  SOLN COMPARISON:  MRI 01/19/2023, CT 11/26/2022 FINDINGS: Lower chest: Lung bases demonstrate no acute airspace disease. Hepatobiliary: Central hepatic cyst. Cholecystectomy. No biliary dilatation Pancreas: No ductal dilatation.  No definitive inflammatory change Spleen: Normal in size without focal abnormality. Adrenals/Urinary Tract: Adrenal glands are normal. Kidneys show no hydronephrosis. The bladder is normal Stomach/Bowel: Stomach is within normal limits. Appendix appears normal. No evidence of bowel wall thickening, distention, or inflammatory changes.  Mild diverticular disease of the left colon. Vascular/Lymphatic: Aortic atherosclerosis. No enlarged abdominal or pelvic lymph nodes. Reproductive: Prostate is unremarkable. Other: Negative for pelvic effusion or free air. Small fat containing supraumbilical ventral hernia Musculoskeletal: No acute or suspicious osseous abnormality IMPRESSION: 1. No CT evidence for acute intra-abdominal or pelvic abnormality. 2. Mild diverticular disease of the left colon without acute inflammatory process. 3. Small fat containing supraumbilical ventral hernia. 4. Aortic atherosclerosis. Aortic Atherosclerosis (ICD10-I70.0). Electronically Signed   By: Luke Bun M.D.   On: 09/06/2023 19:42   DG Chest 2 View Result Date: 09/06/2023 CLINICAL DATA:  Chest pain. EXAM: CHEST - 2 VIEW COMPARISON:  Radiographs 07/21/2022 and 03/02/2014. Cardiac CT 07/25/2019. FINDINGS: The heart size and mediastinal contours are stable. There is stable prominent fat at the right cardiophrenic angle. The lungs are clear. No pleural effusion or pneumothorax. Stable minimal degenerative changes in the spine without acute osseous abnormality. IMPRESSION: No evidence of acute cardiopulmonary process. Electronically Signed   By: Elsie Perone M.D.   On: 09/06/2023 18:25     Labs:   Basic Metabolic Panel: Recent Labs  Lab 09/06/23 1755 09/07/23 0806 09/07/23 1300 09/08/23 0910  NA 142 138 139 140  K 3.9 4.2 4.2 4.2  CL 104 105 105 102  CO2 23 26 25 27   GLUCOSE 116* 102* 94 108*  BUN 16 14 12 11   CREATININE 1.25* 1.01 0.97 1.12  CALCIUM  9.3 8.7* 8.8* 9.3  MG  --  1.9 1.7  --   PHOS  --  2.9 3.2  --    GFR Estimated Creatinine Clearance: 87.2 mL/min (by C-G formula based on SCr of 1.12 mg/dL). Liver Function Tests: Recent Labs  Lab 09/06/23 1755 09/07/23 0806 09/07/23 1300 09/08/23 0910  AST 97* 76* 63* 42*  ALT 66* 86* 79* 66*  ALKPHOS 70 53 55 62  BILITOT 0.9 1.4* 1.1 1.3*  PROT 7.2 6.3* 6.3* 7.2  ALBUMIN 4.6 3.6 3.5  4.0   Recent Labs  Lab 09/06/23 1755 09/06/23 2014 09/07/23 0806 09/08/23 0910  LIPASE 2,787* >2,800* 220* 48  AMYLASE 1,370*  --   --   --    No results for input(s): AMMONIA in the last 168 hours. Coagulation profile No results for input(s): INR, PROTIME in the last 168 hours.  CBC: Recent Labs  Lab 09/06/23 1755 09/07/23 0806 09/07/23 1326  WBC 8.3 8.2 7.5  HGB 15.1 14.0 13.6  HCT 42.6 40.7 39.2  MCV 89.7 92.7 91.8  PLT 241 169 165   Cardiac Enzymes: Recent Labs  Lab 09/07/23 0806 09/07/23 1300  CKTOTAL 162 201   BNP: Invalid input(s): POCBNP CBG: No results for input(s): GLUCAP in the last 168 hours. D-Dimer No results for input(s): DDIMER in the last 72 hours. Hgb A1c No results for input(s): HGBA1C in the last 72 hours. Lipid Profile Recent Labs    09/06/23 1755 09/07/23 0806  CHOL  --  110  HDL  --  35*  LDLCALC  --  48  TRIG 349* 133  CHOLHDL  --  3.1   Thyroid  function studies No results for input(s): TSH, T4TOTAL, T3FREE, THYROIDAB in the last 72 hours.  Invalid input(s): FREET3 Anemia work up No results for input(s): VITAMINB12, FOLATE, FERRITIN, TIBC, IRON, RETICCTPCT in the last 72 hours. Microbiology No results found for this or any previous visit (from the past 240 hours).  Time coordinating discharge: 45 minutes  Signed: Malaquias Lenker  [facility] 09/09/2023, 3:21 PM

## 2023-09-08 NOTE — Interval H&P Note (Signed)
 History and Physical Interval Note:  09/08/2023 11:52 AM  Casey Miles  has presented today for surgery, with the diagnosis of Recurrent pancreatitis rule out choledocholithiasis.  The various methods of treatment have been discussed with the patient and family. After consideration of risks, benefits and other options for treatment, the patient has consented to  Procedure(s): ULTRASOUND, UPPER GI TRACT, ENDOSCOPIC (N/A) as a surgical intervention.  The patient's history has been reviewed, patient examined, no change in status, stable for surgery.  I have reviewed the patient's chart and labs.  Questions were answered to the patient's satisfaction.     Kawthar Ennen Mansouraty Jr

## 2023-09-08 NOTE — Anesthesia Preprocedure Evaluation (Addendum)
 Anesthesia Evaluation  Patient identified by MRN, date of birth, ID band Patient awake    Reviewed: Allergy & Precautions, NPO status , Patient's Chart, lab work & pertinent test results  History of Anesthesia Complications Negative for: history of anesthetic complications  Airway Mallampati: II  TM Distance: >3 FB Neck ROM: Full    Dental no notable dental hx.    Pulmonary neg pulmonary ROS   Pulmonary exam normal        Cardiovascular hypertension, + CAD  Normal cardiovascular exam     Neuro/Psych   Anxiety        GI/Hepatic ,GERD  ,,Recurrent pancreatitis   Endo/Other  negative endocrine ROS    Renal/GU negative Renal ROS     Musculoskeletal negative musculoskeletal ROS (+)    Abdominal   Peds  Hematology negative hematology ROS (+)   Anesthesia Other Findings Day of surgery medications reviewed with patient.  Reproductive/Obstetrics                              Anesthesia Physical Anesthesia Plan  ASA: 2  Anesthesia Plan: MAC   Post-op Pain Management: Minimal or no pain anticipated   Induction:   PONV Risk Score and Plan: 1 and Treatment may vary due to age or medical condition and Propofol  infusion  Airway Management Planned:   Additional Equipment: None  Intra-op Plan:   Post-operative Plan:   Informed Consent: I have reviewed the patients History and Physical, chart, labs and discussed the procedure including the risks, benefits and alternatives for the proposed anesthesia with the patient or authorized representative who has indicated his/her understanding and acceptance.       Plan Discussed with: CRNA  Anesthesia Plan Comments:          Anesthesia Quick Evaluation

## 2023-09-08 NOTE — Progress Notes (Signed)
 PROGRESS NOTE  Casey Miles  DOB: [birth year]  PCP: Domenica Harlene LABOR, MD FMW:[id number]  DOA: 09/06/2023  LOS: 1 day  Hospital Day: 3  Brief narrative: Casey Miles is a 58 y.o. male with PMH significant for hypertriglyceridemia, CAD, h/o gallstone pancreatitis s/p cholecystectomy 07/2022, hepatic steatosis, diverticulosis, cervical disc disease  7/6, patient presented to ED at [facility] with complaint of nausea, back pain, abdominal pain, symptoms similar to past experience with pancreatitis.  In the ED, patient was hemodynamically stable Labs showed CBC unremarkable, lipase level over 2700, amylase level over 1300, AST, ALT mildly elevated, alk phos, total bili normal, BUN/creatinine 16/1.25 CT abdomen pelvis did not show any acute pathology.  It showed normal pancreas without ductal dilatation, cholecystectomy status with out biliary duct dilatation and a central hepatic cyst, mild diverticular disease of the left colon without acute inflammatory process.  Started on conservative management of pancreatitis with IV fluid, bowel rest Transferred to [facility] for observation under [facility] GI was consulted  Subjective: Patient was seen and examined this morning.  Patient was being wheeled down for EUS. Met with wife Ms. Leita at bedside.  Discussed about improving LFTs, lipase level. Blood pressure in 140s mostly.. Remains n.p.o. for possible EUS today  Assessment and plan: Acute pancreatitis H/o gallstone pancreatitis Presented with sudden onset abdominal pain CT abdomen with normal pancreas without evidence of biliary ductal dilatation But lipase and amylase level were significantly elevated without rising alk phos or direct bili Were normal.  Calcium  and triglyceride level were normal. Patient was started on conservative management with IV fluid, bowel rest, IV antiemetics, IV analgesics Lipase level significantly improved.  Down to normal today. GI following.  GI has a  plan of EUS today. Recent Labs  Lab 09/06/23 1755 09/06/23 2014 09/07/23 0806 09/08/23 0910  LIPASE 2,787* >2,800* 220* 48  AMYLASE 1,370*  --   --   --    Hepatic steatosis Likely due to high triglyceride level  Acute hepatitis panel unremarkable. follow-up as an outpatient Recent Labs  Lab 09/06/23 1755 09/06/23 2014 09/07/23 0806 09/07/23 1300 09/07/23 1326 09/07/23 1530 09/08/23 0910  AST 97*  --  76* 63*  --   --  42*  ALT 66*  --  86* 79*  --   --  66*  ALKPHOS 70  --  53 55  --   --  62  BILITOT 0.9  --  1.4* 1.1  --   --  1.3*  BILIDIR 0.4*  --   --   --   --  0.2  --   IBILI 0.5  --   --   --   --   --   --   PROT 7.2  --  6.3* 6.3*  --   --  7.2  ALBUMIN 4.6  --  3.6 3.5  --   --  4.0  LIPASE 2,787* >2,800* 220*  --   --   --  48  PLT 241  --  169  --  165  --   --    AKI Creatinine was elevated 1.25 on admission. Improved with IV fluid. Recent Labs    09/09/22 1537 09/06/23 1755 09/07/23 0806 09/07/23 1300 09/08/23 0910  BUN 14 16 14 12 11   CREATININE 1.16 1.25* 1.01 0.97 1.12  CO2 29 23 26 25 27    Essential hypertension Patient reports he has 'whitecoat syndrome'.  Blood pressure recently as outpatient was normal.  I discussed with patient and his wife about his blood pressure being elevated most to the 150s in the last 24 hours.  They would like to wait before committing to any antihypertensives. IV hydralazine  as needed for systolic blood pressure over 160.  H/o hypertriglyceridemia Triglyceride level normal at 133 this time.  H/o CAD Coronary calcium  score 31 per CT scan 2021 Plan to resume Crestor  when able to tolerate oral intake.  Patient states he is not on aspirin .  H/o colon polyps   Colonoscopy 07/2022 identified 2 sessile serrated/tubular adenomatous polyps removed from the colon. Next colon polyp surveillance colonoscopy due 07/2027 2 follow-up with GI as an outpatient   Supraumbilical ventral hernia Follow-up with general surgery  as an outpatient  Morbid Obesity  Body mass index is 35.73 kg/m. Patient has been advised to make an attempt to improve diet and exercise patterns to aid in weight loss.  Mobility: Encourage ambulation  Goals of care   Code Status: Full Code     DVT prophylaxis:  SCDs Start: 09/07/23 0106   Antimicrobials: None Fluid: Can stop IV fluid Consultants: GI Family Communication: Wife at bedside  Status: Observation Level of care:  Telemetry   Patient is from: Home Needs to continue in-hospital care: Ongoing conservative management of pancreatitis Anticipated d/c to: Home this afternoon versus tomorrow based on EUS finding and GI recommendation    Diet:  Diet Order             Diet NPO time specified  Diet effective now                   Scheduled Meds:   PRN meds: [MAR Hold] acetaminophen  **OR** [MAR Hold] acetaminophen , [MAR Hold] HYDROcodone -acetaminophen , [MAR Hold] metoCLOPramide  (REGLAN ) injection, [MAR Hold]  morphine  injection, [MAR Hold] ondansetron  (ZOFRAN ) IV   Infusions:   sodium chloride        Antimicrobials: Anti-infectives (From admission, onward)    None       Objective: Vitals:   09/08/23 0504 09/08/23 1135  BP: (!) 140/90 (!) 155/107  Pulse: 63   Resp: 14 20  Temp: 97.7 F (36.5 C) (!) 97.5 F (36.4 C)  SpO2: 100% 98%    Intake/Output Summary (Last 24 hours) at 09/08/2023 1145 Last data filed at 09/07/2023 1504 Gross per 24 hour  Intake 1372.89 ml  Output --  Net 1372.89 ml   Filed Weights   09/06/23 1743  Weight: 106.6 kg   Weight change:  Body mass index is 35.73 kg/m.   Physical Exam: General exam: Pleasant, middle-aged Caucasian male.  Not in distress Skin: No rashes, lesions or ulcers. HEENT: Atraumatic, normocephalic, no obvious bleeding Lungs: Clear to auscultation bilaterally,  CVS: S1, S2, no murmur,   GI/Abd: Soft, mild epigastric tenderness, nondistended, ventral hernia noted.  Bowel sound present,    CNS: Alert, awake, oriented x 3 Psychiatry: Mood appropriate,  Extremities: No pedal edema, no calf tenderness,   Data Review: I have personally reviewed the laboratory data and studies available.  F/u labs ordered Unresulted Labs (From admission, onward)     Start     Ordered   Unscheduled  Comprehensive metabolic panel with GFR  Tomorrow morning,   R        09/08/23 1145           Signed, Chapman Rota, MD [facility] 09/08/2023

## 2023-09-08 NOTE — Plan of Care (Signed)
  Problem: Education: Goal: Knowledge of General Education information will improve Description: Including pain rating scale, medication(s)/side effects and non-pharmacologic comfort measures Outcome: Progressing   Problem: Health Behavior/Discharge Planning: Goal: Ability to manage health-related needs will improve Outcome: Progressing   Problem: Clinical Measurements: Goal: Ability to maintain clinical measurements within normal limits will improve Outcome: Progressing Goal: Will remain free from infection Outcome: Progressing Goal: Diagnostic test results will improve Outcome: Progressing Goal: Respiratory complications will improve Outcome: Progressing Goal: Cardiovascular complication will be avoided Outcome: Progressing   Problem: Activity: Goal: Risk for activity intolerance will decrease Outcome: Progressing   Problem: Nutrition: Goal: Adequate nutrition will be maintained Outcome: Progressing   Problem: Elimination: Goal: Will not experience complications related to bowel motility Outcome: Progressing   Problem: Pain Managment: Goal: General experience of comfort will improve and/or be controlled Outcome: Progressing   Problem: Safety: Goal: Ability to remain free from injury will improve Outcome: Progressing   Problem: Skin Integrity: Goal: Risk for impaired skin integrity will decrease Outcome: Progressing

## 2023-09-08 NOTE — Transfer of Care (Signed)
 Immediate Anesthesia Transfer of Care Note  Patient: Casey Miles  Procedure(s) Performed: ULTRASOUND, UPPER GI TRACT, ENDOSCOPIC  Patient Location: PACU and Endoscopy Unit  Anesthesia Type:MAC  Level of Consciousness: awake, alert , and oriented  Airway & Oxygen Therapy: Patient Spontanous Breathing  Post-op Assessment: Report given to RN and Post -op Vital signs reviewed and stable  Post vital signs: Reviewed and stable  Last Vitals:  Vitals Value Taken Time  BP 121/60 09/08/23 13:02  Temp    Pulse 69 09/08/23 13:04  Resp 18 09/08/23 13:04  SpO2 96 % 09/08/23 13:04  Vitals shown include unfiled device data.  Last Pain:  Vitals:   09/08/23 1135  TempSrc: Temporal  PainSc: 0-No pain      Patients Stated Pain Goal: 0 (09/07/23 2000)  Complications: No notable events documented.

## 2023-09-08 NOTE — Anesthesia Postprocedure Evaluation (Signed)
 Anesthesia Post Note  Patient: Casey Miles  Procedure(s) Performed: ULTRASOUND, UPPER GI TRACT, ENDOSCOPIC     Patient location during evaluation: PACU Anesthesia Type: MAC Level of consciousness: awake and alert Pain management: pain level controlled Vital Signs Assessment: post-procedure vital signs reviewed and stable Respiratory status: spontaneous breathing, nonlabored ventilation and respiratory function stable Cardiovascular status: blood pressure returned to baseline Postop Assessment: no apparent nausea or vomiting Anesthetic complications: no   No notable events documented.  Last Vitals:  Vitals:   09/08/23 1310 09/08/23 1320  BP: (!) 96/56 114/77  Pulse: 73 68  Resp: (!) 22 (!) 24  Temp:    SpO2: 94% 95%    Last Pain:  Vitals:   09/08/23 1320  TempSrc:   PainSc: 0-No pain                 Vertell Row

## 2023-09-08 NOTE — Op Note (Addendum)
 [facility] Patient Name: Casey Miles Procedure Date: 09/08/2023 MRN: [id number] Attending MD: Aloha Finner , MD, [id number] Date of Birth: 07-12-1965 CSN: [id number] Age: 58 Admit Type: Inpatient Procedure:                Upper EUS Indications:              Suspected choledocholithiasis, Acute recurrent                            pancreatitis Providers:                Aloha Finner, MD, Clotilda Schmitz, RN, Farris [place]gate, Technician Referring MD:              Medicines:                Monitored Anesthesia Care Complications:            No immediate complications. Estimated Blood Loss:     Estimated blood loss was minimal. Procedure:                Pre-Anesthesia Assessment:                           - Prior to the procedure, a History and Physical                            was performed, and patient medications and                            allergies were reviewed. The patient's tolerance of                            previous anesthesia was also reviewed. The risks                            and benefits of the procedure and the sedation                            options and risks were discussed with the patient.                            All questions were answered, and informed consent                            was obtained. Prior Anticoagulants: The patient has                            taken no anticoagulant or antiplatelet agents. ASA                            Grade Assessment: II - A patient with mild systemic                            disease. After reviewing  the risks and benefits,                            the patient was deemed in satisfactory condition to                            undergo the procedure.                           After obtaining informed consent, the endoscope was                            passed under direct vision. Throughout the                            procedure, the patient's blood  pressure, pulse, and                            oxygen saturations were monitored continuously. The                            [id number]-[id number] ([id number]) Olympus endoscope was introduced                            through the mouth, and advanced to the second part                            of duodenum. The [id number] ([id number]) Olympus                            duodenoscope was introduced through the mouth, and                            advanced to the area of papilla. The [id number]                            ([id number]) Olympus linear ultrasound scope was                            introduced through the mouth, and advanced to the                            duodenum for ultrasound examination from the                            stomach and duodenum. The upper EUS was                            accomplished without difficulty. The patient                            tolerated the procedure. Scope In: Scope Out: Findings:      ENDOSCOPIC FINDING: :      No gross lesions were noted in the entire esophagus.  The Z-line was irregular and was found 42 cm from the incisors.      Patchy mildly erythematous mucosa without bleeding was found in the       entire examined stomach. Biopsies were taken with a cold forceps for       histology and Helicobacter pylori testing.      A single 5 mm submucosal nodule was found in the second portion of the       duodenum proximal to the major papilla.      The major papilla was normal.      No other gross lesions were noted in the duodenal bulb, in the first       portion of the duodenum and in the second portion of the duodenum.      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       These consisted of diffusely increased echogenicity. No evidence of any       mass or lesion was found.      The diameter of the main pancreatic duct (MPD) measured:      - HOP 2.8 -> 1.8 mm (head of pancreas)      - NOP  1.7 mm (neck of pancreas)      - BOP 1.5 mm (body of the pancreas)      - TOP 0.8 mm (tail of the pancreas).      There was no sign of significant endosonographic abnormality in the       common bile duct (3.5 mm) and in the common hepatic duct (5.8 mm). No       stones, no biliary sludge and ducts of normal caliber were identified.      Endosonographic imaging of the ampulla showed no intramural       (subepithelial) lesion.      A round intramural (subepithelial) lesion was found in the second       portion of the duodenum (corresponding with duodenal nodule). The lesion       was anechoic. Endosonographically, the lesion appeared to originate from       within the submucosa (Layer 3). The lesion also measured 3.7 mm by 4.4       mm in diameter. The outer margins were well defined.      Endosonographic imaging in the visualized portion of the liver showed no       mass.      No malignant-appearing lymph nodes were visualized in the celiac region       (level 20), peripancreatic region and porta hepatis region.      The celiac region was visualized. Impression:               EGD impression:                           - No gross lesions in the entire esophagus. Z-line                            irregular, 42 cm from the incisors.                           - Erythematous mucosa in the stomach. Biopsied.                           -  Submucosal nodule found in the duodenum (proximal                            to major papilla).                           - Normal major papilla.                           - No gross lesions in the duodenal bulb, in the                            first portion of the duodenum and in the second                            portion of the duodenum.                           EUS impression:                           - Pancreatic parenchymal abnormalities consisting                            of diffusely increased echogenicity were noted in                             the entire pancreas. No evidence of any mass or                            lesion was noted.                           - Main pancreatic duct (MPD) diameter was measured.                            Endosonographically, the MPD had a normal                            appearance.                           - Normal bile duct caliber without evidence of                            retained choledocholithiasis.                           - An intramural (subepithelial) lesion was found in                            the second portion of the duodenum (this                            corresponds with the duodenal nodule). The lesion  appeared to originate from within the submucosa                            (Layer 3). This measures 3.7 mm x 4.4 mm in                            diameter. Tissue/fluid has not been obtained.                            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                           - No malignant-appearing lymph nodes were                            visualized in the celiac region (level 20),                            peripancreatic region and porta hepatis region. Moderate Sedation:      Not Applicable - Patient had care per Anesthesia. Recommendation:           - The patient will be observed post-procedure,                            until all discharge criteria are met.                           - Discharge patient to home.                           - Patient has a contact number available for                            emergencies. The signs and symptoms of potential                            delayed complications were discussed with the                            patient. Return to normal activities tomorrow.                            Written discharge instructions were provided to the                             patient.                           - Low fat diet.                           -  Observe patient's clinical course.                           - Await path results.                           - Followup IgG4 levels when they return.                           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                           - MRI/MRCP in 1-year to followup duodenal                            intramural cyst is recommended/can be considered.                           - The findings and recommendations were discussed                            with the patient.                           - The findings and recommendations were discussed                            with the referring physician. Procedure Code(s):        --- Professional ---                           [phone], Esophagogastroduodenoscopy, flexible,                            transoral; with endoscopic ultrasound examination                            limited to the esophagus, stomach or duodenum, and                            adjacent structures                           [id number], Esophagogastroduodenoscopy, flexible,                            transoral; with biopsy, single or multiple Diagnosis Code(s):        ---  Professional ---                           K22.89, Other specified disease of esophagus                           K31.89, Other diseases of stomach and duodenum                           I89.9, Noninfective disorder of lymphatic vessels                            and lymph nodes, unspecified                           K86.9, Disease of pancreas, unspecified                           K85.90, Acute pancreatitis without necrosis or                            infection, unspecified CPT copyright 2022 [employer]  Association. All rights reserved. The codes documented in this report are preliminary and upon coder review may  be revised to meet current compliance requirements. Aloha Finner, MD 09/08/2023 1:20:17 PM Number of Addenda: 0

## 2023-09-08 NOTE — Plan of Care (Signed)

## 2023-09-08 NOTE — Anesthesia Procedure Notes (Signed)
 Procedure Name: MAC Date/Time: 09/08/2023 12:30 PM  Performed by: Obadiah Reyes BROCKS, CRNAPre-anesthesia Checklist: Patient identified, Emergency Drugs available, Suction available, Timeout performed and Patient being monitored Patient Re-evaluated:Patient Re-evaluated prior to induction Oxygen Delivery Method: Simple face mask Preoxygenation: Pre-oxygenation with 100% oxygen Induction Type: IV induction

## 2023-09-09 ENCOUNTER — Telehealth: Payer: Self-pay

## 2023-09-09 ENCOUNTER — Encounter (HOSPITAL_COMMUNITY): Payer: Self-pay | Admitting: Gastroenterology

## 2023-09-09 LAB — SURGICAL PATHOLOGY

## 2023-09-10 ENCOUNTER — Encounter: Payer: Self-pay | Admitting: Physician Assistant

## 2023-09-13 ENCOUNTER — Ambulatory Visit: Payer: Self-pay | Admitting: Gastroenterology

## 2023-09-14 ENCOUNTER — Telehealth: Payer: Self-pay

## 2023-09-14 NOTE — Telephone Encounter (Signed)
 error

## 2023-09-15 ENCOUNTER — Encounter: Payer: No Typology Code available for payment source | Admitting: Family Medicine

## 2023-09-15 NOTE — Telephone Encounter (Signed)
 error

## 2023-10-26 ENCOUNTER — Encounter: Payer: Self-pay | Admitting: Physician Assistant

## 2023-10-26 ENCOUNTER — Ambulatory Visit (INDEPENDENT_AMBULATORY_CARE_PROVIDER_SITE_OTHER): Admitting: Physician Assistant

## 2023-10-26 ENCOUNTER — Other Ambulatory Visit (INDEPENDENT_AMBULATORY_CARE_PROVIDER_SITE_OTHER)

## 2023-10-26 VITALS — BP 130/80 | HR 78 | Ht 68.0 in | Wt 237.0 lb

## 2023-10-26 DIAGNOSIS — K760 Fatty (change of) liver, not elsewhere classified: Secondary | ICD-10-CM | POA: Diagnosis not present

## 2023-10-26 DIAGNOSIS — K439 Ventral hernia without obstruction or gangrene: Secondary | ICD-10-CM | POA: Diagnosis not present

## 2023-10-26 DIAGNOSIS — K7581 Nonalcoholic steatohepatitis (NASH): Secondary | ICD-10-CM

## 2023-10-26 DIAGNOSIS — K859 Acute pancreatitis without necrosis or infection, unspecified: Secondary | ICD-10-CM

## 2023-10-26 DIAGNOSIS — K6389 Other specified diseases of intestine: Secondary | ICD-10-CM

## 2023-10-26 DIAGNOSIS — K3189 Other diseases of stomach and duodenum: Secondary | ICD-10-CM | POA: Diagnosis not present

## 2023-10-26 DIAGNOSIS — Z860101 Personal history of adenomatous and serrated colon polyps: Secondary | ICD-10-CM

## 2023-10-26 DIAGNOSIS — Z9049 Acquired absence of other specified parts of digestive tract: Secondary | ICD-10-CM

## 2023-10-26 LAB — CBC WITH DIFFERENTIAL/PLATELET
Basophils Absolute: 0 K/uL (ref 0.0–0.1)
Basophils Relative: 0.5 % (ref 0.0–3.0)
Eosinophils Absolute: 0.1 K/uL (ref 0.0–0.7)
Eosinophils Relative: 1.2 % (ref 0.0–5.0)
HCT: 43.5 % (ref 39.0–52.0)
Hemoglobin: 15.1 g/dL (ref 13.0–17.0)
Lymphocytes Relative: 26.7 % (ref 12.0–46.0)
Lymphs Abs: 1.7 K/uL (ref 0.7–4.0)
MCHC: 34.7 g/dL (ref 30.0–36.0)
MCV: 91.6 fl (ref 78.0–100.0)
Monocytes Absolute: 0.5 K/uL (ref 0.1–1.0)
Monocytes Relative: 8.6 % (ref 3.0–12.0)
Neutro Abs: 3.9 K/uL (ref 1.4–7.7)
Neutrophils Relative %: 63 % (ref 43.0–77.0)
Platelets: 210 K/uL (ref 150.0–400.0)
RBC: 4.74 Mil/uL (ref 4.22–5.81)
RDW: 12.9 % (ref 11.5–15.5)
WBC: 6.2 K/uL (ref 4.0–10.5)

## 2023-10-26 LAB — HEPATIC FUNCTION PANEL
ALT: 31 U/L (ref 0–53)
AST: 28 U/L (ref 0–37)
Albumin: 4.5 g/dL (ref 3.5–5.2)
Alkaline Phosphatase: 56 U/L (ref 39–117)
Bilirubin, Direct: 0.2 mg/dL (ref 0.0–0.3)
Total Bilirubin: 1 mg/dL (ref 0.2–1.2)
Total Protein: 7.6 g/dL (ref 6.0–8.3)

## 2023-10-26 LAB — LIPASE: Lipase: 53 U/L (ref 11.0–59.0)

## 2023-10-26 LAB — IBC + FERRITIN
Ferritin: 86 ng/mL (ref 22.0–322.0)
Iron: 130 ug/dL (ref 42–165)
Saturation Ratios: 35 % (ref 20.0–50.0)
TIBC: 371 ug/dL (ref 250.0–450.0)
Transferrin: 265 mg/dL (ref 212.0–360.0)

## 2023-10-26 LAB — BASIC METABOLIC PANEL WITH GFR
BUN: 15 mg/dL (ref 6–23)
CO2: 30 meq/L (ref 19–32)
Calcium: 9.5 mg/dL (ref 8.4–10.5)
Chloride: 101 meq/L (ref 96–112)
Creatinine, Ser: 1.13 mg/dL (ref 0.40–1.50)
GFR: 72.79 mL/min (ref >60.00)
Glucose, Bld: 109 mg/dL — ABNORMAL HIGH (ref 70–99)
Potassium: 4.5 meq/L (ref 3.5–5.1)
Sodium: 140 meq/L (ref 135–145)

## 2023-10-26 NOTE — Progress Notes (Signed)
 10/26/2023 Casey Miles [id number] 1965/10/13  Referring provider: Domenica Harlene LABOR, MD Primary GI doctor: Dr. 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  Continue to avoid all alcohol  Submucosal lesion second portion of duodenum found on EUS for acute pancreatitis MRI/ MRCP in 1- year to followup duodenal intramural cyst is recommended/ can be considered. Recall placed  Hepatic steatosis    Latest Ref Rng & Units 09/08/2023    9:10 AM 09/07/2023    3:30 PM 09/07/2023    1:00 PM  Hepatic Function  Total Protein 6.5 - 8.1 g/dL 7.2   6.3   Albumin 3.5 - 5.0 g/dL 4.0   3.5   AST 15 - 41 U/L 42   63   ALT 0 - 44 U/L 66   79   Alk Phosphatase 38 - 126 U/L 62   55   Total Bilirubin 0.0 - 1.2 mg/dL 1.3   1.1   Bilirubin, Direct 0.0 - 0.2 mg/dL  0.2     Platelets 834  - serologic work-up to rule out concomitant liver disease  can consider ultrasound elastography to follow-up  - need LFTs and CBC monitored every 6 months, - evaluation with imaging every 2-3 years.  - Encouraged diet/exercise for modest 10% body weight loss as treatment for hepatic steatosis -Continue to work on risk factor modification including diet exercise and control of risk factors including blood sugars. - cessation of alcohol  Personal history of colon polyps 07/07/2022 colonoscopy 4 mm SS polyp, 8 mm TA polyp nonbleeding internal hemorrhoids Recall  08/03/2027  Ventral hernia Seeing [facility] next week for evaluation  Patient Care Team: Domenica Harlene LABOR, MD as PCP - General (Family Medicine) Jeffrie Oneil BROCKS, MD as PCP - Cardiology (Cardiology)  HISTORY OF PRESENT ILLNESS: 58 y.o. male with a past medical history  of cervical disc disease, hyperlipidemia, hypertriglyceridemia, mild coronary artery disease per CT 2021, hepatic steatosis, diverticulosis, colon polyps and gallstones s/p cholecystectomy 07/2022  and others listed below presents for hospital follow-up for acute pancreatitis.  Patient seen in the hospital by our inpatient team 7/7 through 7/8 for acute pancreatitis.  Patient has history of gallstone pancreatitis status post cholecystectomy.  MRI/MRCP unremarkable referral choledocholithiasis. 09/08/2023 EUS with Dr. Wilhelmenia showed pancreatic abnormalities consistent of diffusely increased echogenicity entire pancreas no mass or lesions.  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  History of Present Illness   Casey Miles is a 58 year old male with a history of acute pancreatitis who presents with recurrent abdominal pain.  He describes the abdominal pain as similar to previous episodes of acute pancreatitis but more intense. The pain began in the evening and was severe enough to prevent him from sitting still during a blood pressure check. He  likened the sensation to 'an elephant sitting on my chest, but maybe like a pig.'  He has a history of acute pancreatitis, previously managed with a cholecystectomy in May 2024. Despite this, he experienced another episode of pancreatitis recently, with a lipase level of 2800. An MRCP and EUS were performed; the patient recalls being told  that the pancreas was inflamed, but that no stones or masses were seen. The patient recalls being told that a small cyst or nodule was seen in the duodenum.  He reports no new medications or supplements, and his triglycerides were normal. He has not consumed alcohol for two years due to adverse effects and has no history of smoking. His diet included a fatty meal on the day of the recent episode, similar to meals he had previously without issue.  There is no family history of pancreatic or liver issues, although his mother had her gallbladder removed. He notes a pattern of similar episodes occurring annually around June, with increasing severity.  No current symptoms of heartburn, nausea, or vomiting, and the pain has resolved.      He  reports that he has never smoked. He has never used smokeless tobacco. He reports current alcohol use of about 1.0 - 2.0 standard drink of alcohol per week. He reports that he does not use drugs.  RELEVANT GI HISTORY, IMAGING AND LABS: Results   LABS Lipase: 2800  RADIOLOGY MRCP: No gallstones, pancreatic inflammation, normal duct, no masses or lesions, small cyst in duodenum Liver ultrasound: Hepatic steatosis      EGD/EUS 09/08/2023 7/8 EGD Z-line irregular, your edematous mucosa in the stomach, submucosal nodule duodenum unremarkable gastric mucosa negative H. Pylori - No gross lesions in the entire esophagus. Z- line irregular, 42 cm from the incisors. - Erythematous mucosa in the stomach. Biopsied. - Submucosal nodule found in the duodenum ( proximal to major papilla) . - Normal major papilla. - No gross lesions in the duodenal bulb EUS impression: - Pancreatic parenchymal abnormalities consisting of diffusely increased echogenicity were noted in the entire pancreas. No evidence of any mass or lesion was noted. - Main pancreatic duct ( MPD) diameter was measured. Endosonographically, the MPD had a normal appearance. - Normal bile duct caliber without  evidence of retained choledocholithiasis. - An intramural ( subepithelial) lesion was found in the second portion of the duodenum ( this corresponds with the duodenal nodule) . The lesion appeared to originate from within the submucosa ( Layer 3) . This measures 3. 7 mm x 4. 4 mm in diameter. Tissue/ fluid has not been obtained. However, the endosonographic appearance is consistent with an intramural cyst versus intramural pseudocyst. - No malignant- appearing lymph nodes were visualized in the celiac region ( level 20) , peripancreatic region and porta hepatis region.  Colonoscopy 07/07/2022: - One 4 mm polyp in the proximal ascending colon, removed with a cold snare. Resected and retrieved.  - One 8 mm polyp in the proximal sigmoid colon, removed with a hot snare. Resected and retrieved.  - Mild sigmoid diverticulosis  - Non-bleeding internal hemorrhoids. - 5 year recall colonoscopy  1. Surgical [P], colon, ascending polyp x 1, polyp (1) - SESSILE SERRATED POLYP WITHOUT CYTOLOGIC DYSPLASIA 2. Surgical [P], colon, sigmoid polyp x 1, polyp (1) - TUBULAR ADENOMA WITHOUT HIGH-GRADE DYSPLASIA OR MALIGNANCY   Colonoscopy 05/24/2019: - A 12 mm polyp was found in the distal sigmoid colon, 25 cm from the anal verge. The polyp was semi-pedunculated with a thick pedicle. The polyp  was removed with a hot snare. Resection and retrieval were complete.  - A few rare small-mouthed diverticula were found in the sigmoid colon. - The terminal ileum appeared normal.  - The exam was otherwise without abnormality on direct and retroflexion views.  - Non-bleeding internal hemorrhoids were found during retroflexion. The hemorrhoids were small.   EGD 10/31/2009: Normal EGD Ectopic gastric mucosa in the proximal esophagus  CBC    Component Value Date/Time   WBC 7.5 09/07/2023 1326   RBC 4.27 09/07/2023 1326   HGB 13.6 09/07/2023 1326   HCT 39.2 09/07/2023 1326   PLT 165 09/07/2023 1326   MCV 91.8 09/07/2023  1326   MCH 31.9 09/07/2023 1326   MCHC 34.7 09/07/2023 1326   RDW 12.4 09/07/2023 1326   LYMPHSABS 1.7 09/09/2022 1537   MONOABS 0.5 09/09/2022 1537   EOSABS 0.1 09/09/2022 1537   BASOSABS 0.0 09/09/2022 1537   Recent Labs    09/06/23 1755 09/07/23 0806 09/07/23 1326  HGB 15.1 14.0 13.6    CMP     Component Value Date/Time   NA 140 09/08/2023 0910   K 4.2 09/08/2023 0910   CL 102 09/08/2023 0910   CO2 27 09/08/2023 0910   GLUCOSE 108 (H) 09/08/2023 0910   BUN 11 09/08/2023 0910   CREATININE 1.12 09/08/2023 0910   CREATININE 1.25 10/07/2013 0829   CALCIUM  9.3 09/08/2023 0910   PROT 7.2 09/08/2023 0910   ALBUMIN 4.0 09/08/2023 0910   AST 42 (H) 09/08/2023 0910   ALT 66 (H) 09/08/2023 0910   ALKPHOS 62 09/08/2023 0910   BILITOT 1.3 (H) 09/08/2023 0910   GFRNONAA >60 09/08/2023 0910   GFRAA >60 08/03/2017 0900      Latest Ref Rng & Units 09/08/2023    9:10 AM 09/07/2023    3:30 PM 09/07/2023    1:00 PM  Hepatic Function  Total Protein 6.5 - 8.1 g/dL 7.2   6.3   Albumin 3.5 - 5.0 g/dL 4.0   3.5   AST 15 - 41 U/L 42   63   ALT 0 - 44 U/L 66   79   Alk Phosphatase 38 - 126 U/L 62   55   Total Bilirubin 0.0 - 1.2 mg/dL 1.3   1.1   Bilirubin, Direct 0.0 - 0.2 mg/dL  0.2        Current Medications:    Current Outpatient Medications (Cardiovascular):    rosuvastatin  (CRESTOR ) 40 MG tablet, TAKE 1 TABLET BY MOUTH EVERY DAY      Medical History:  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   Elevated BP 10/08/2011   Elevated LFTs 06/19/2016   Fatigue 01/08/2011   Fatty liver    GERD (gastroesophageal reflux disease)    Hyperglycemia 06/17/2016   Hyperlipidemia  10/08/2010   Kidney stone    NECK PAIN 09/26/2009   Neuromuscular disorder (HCC)    C4C5 herniated disc    Other acute reactions to stress 09/26/2009   Other and unspecified hyperlipidemia 01/23/2013   PARESTHESIA 11/22/2009   Preventative health care 08/01/2012   SHOULDER PAIN, LEFT 07/17/2008   Testosterone  deficiency 07/09/2011   Vitamin D  deficiency 07/08/2011  Allergies: No Known Allergies   Surgical History:  He  has a past surgical history that includes Upper gastrointestinal endoscopy; Umbilical hernia repair (2017); Cardiac catheterization; Cholecystectomy (N/A, 07/23/2022); EUS (N/A, 09/08/2023); and Esophagogastroduodenoscopy (N/A, 09/08/2023). Family History:  His family history includes Cancer in an other family member; Colitis in his maternal grandfather and maternal grandmother; Colon polyps in an other family member; Diabetes in his maternal grandmother, mother, paternal grandmother, and another family member; Heart attack in an other family member; Heart attack (age of onset: 27) in his father; Heart disease in his maternal grandfather and maternal grandmother; Hyperlipidemia in his mother; Hypertension in his mother and another family member; Irritable bowel syndrome in some other family members; Lupus in his cousin; Stomach cancer in an other family member.  REVIEW OF SYSTEMS  : All other systems reviewed and negative except where noted in the History of Present Illness.  PHYSICAL EXAM: BP 130/80   Pulse 78   Ht 5' 8 (1.727 m)   Wt 237 lb (107.5 kg)   BMI 36.04 kg/m  Physical Exam   GENERAL APPEARANCE: Well nourished, in no apparent distress. HEENT: No cervical lymphadenopathy, unremarkable thyroid , sclerae anicteric, conjunctiva pink. RESPIRATORY: Respiratory effort normal, breath sounds equal bilaterally without rales, rhonchi, or wheezing. CARDIO: Regular rate and rhythm with no murmurs, rubs, or gallops, peripheral pulses intact. ABDOMEN: Soft, non-distended,  active bowel sounds in all four quadrants, no tenderness to palpation, no rebound, no mass appreciated. Ventral hernia present. RECTAL: Declines. MUSCULOSKELETAL: Full range of motion, normal gait, without edema. SKIN: Dry, intact without rashes or lesions. No jaundice. NEURO: Alert, oriented, no focal deficits. PSYCH: Cooperative, normal mood and affect.      Alan JONELLE Coombs, PA-C 11:56 AM

## 2023-10-26 NOTE — Patient Instructions (Signed)
 Your provider has requested that you go to the basement level for lab work before leaving today. Press B on the elevator. The lab is located at the first door on the left as you exit the elevator.  Acute Pancreatitis  Acute pancreatitis happens when a gland called the pancreas suddenly develops inflammation, making it irritated and swollen. The pancreas is found on the left side of the abdomen, behind the stomach. The pancreas makes proteins (enzymes) that help to digest food. It also releases the hormones glucagon and insulin. These help to regulate blood sugar. Most sudden (acute) attacks of this condition last a few days and can cause serious problems. Some people become dehydrated and develop low blood pressure. In severe cases, bleeding in the abdomen can lead to shock and can be life-threatening. The lungs, heart, and kidneys may stop working. What are the causes? This condition may be caused by: Heavy alcohol use. Drug use. Gallstones or other conditions that can block the tube that drains the pancreas (pancreatic duct). A tumor in the pancreas. Other causes include: Being exposed to certain medicines or certain chemicals. Having health conditions such as diabetes, high triglycerides, or high calcium  levels in your blood. High calcium  levels are usually caused by the parathyroid gland being too active. An infection in the pancreas. Damage caused by an accident (trauma) or by the poison (venom) of a scorpion sting. Abdominal surgery. Autoimmune pancreatitis. This is when the body's disease-fighting system (immune system) attacks the pancreas. Genes that are passed from parent to child (inherited). In some cases, the cause of this condition is not known. What are the signs or symptoms? Symptoms of this condition include: Pain in the upper abdomen that may spread (radiate) to the back. Pain may be severe and often worsens after you eat. A tender and swollen abdomen. Nausea and  vomiting. Fever. How is this diagnosed? This condition may be diagnosed based on: A physical exam. Blood tests. These include an increased (elevated) level of lipase or amylase. Imaging tests, such as CT scans, MRIs, or an ultrasound of the abdomen. How is this treated? Treatment for this condition often requires a hospital stay and may include: Pain medicine. IV fluids. Placing a tube in the stomach to remove stomach contents and to control vomiting (nasogastric tube, or NG tube). Not eating until vomiting has lessened. Treating any underlying conditions that may be the cause. Treatment may include: Antibiotic medicines, if your condition is caused by an infection. Steroid medicine, if your condition is caused by your immune system attacking your pancreas (autoimmune disease). Surgery on the gallbladder or pancreas, if your condition is caused by gallstones or another blockage. Follow these instructions at home: Medicines Take over-the-counter and prescription medicines only as told by your health care provider. If you were prescribed an antibiotic medicine, take it as told by your health care provider. Do not stop using the antibiotic even if you start to feel better. Ask your health care provider if the medicine prescribed to you: Requires you to avoid driving or using machinery. Can cause constipation. You may need to take these actions to prevent or treat constipation: Take over-the-counter or prescription medicines. Eat foods that are high in fiber, such as beans, whole grains, and fresh fruits and vegetables. Limit foods that are high in fat and processed sugars, such as fried or sweet foods. Eating and drinking  Follow instructions from your health care provider about diet. This may involve avoiding alcohol and having less fat in your  diet. Eat smaller, more frequent meals. Doing this causes the pancreas to make less digestive fluid. Drink enough fluid to keep your urine pale  yellow. Do not drink alcohol if it caused your condition. General instructions Do not use any products that contain nicotine or tobacco. These products include cigarettes, chewing tobacco, and vaping devices, such as e-cigarettes. If you need help quitting, ask your health care provider. Get plenty of rest. If directed, check your blood sugar at home as told by your health care provider. Keep all follow-up visits. This is important. Contact a health care provider if: You do not get better as fast as expected. Your symptoms get worse or you get new symptoms. You keep having pain, weakness, or nausea. You get better and then pain comes back. You have a fever. Get help right away if: You vomit every time you eat or drink. Your pain becomes severe. Your skin or the white parts of your eyes turn yellow (jaundice). You have sudden swelling in your abdomen. You feel dizzy or you faint. Your blood sugar is high (over 300 mg/dL). You vomit blood. These symptoms may be an emergency. Get help right away. Call 911. Do not wait to see if the symptoms will go away. Do not drive yourself to the hospital. Summary Acute pancreatitis happens when inflammation of the pancreas suddenly occurs and the pancreas becomes irritated and swollen. This condition is typically caused by heavy alcohol use, drug use, or gallstones. Treatment for this condition usually requires a stay in the hospital. This information is not intended to replace advice given to you by your health care provider. Make sure you discuss any questions you have with your health care provider. Document Revised: 01/08/2021 Document Reviewed: 01/08/2021 Elsevier Patient Education  2024 [employer].  You will be due for MRCP/MRI in August 2026.  We will contact you when it is time to schedule.   You have been scheduled for a follow up appointment with Dr. Charlanne on 01-07-24 at 9:30am. Please arrive 10 minutes early for registration. If you  need to reschedule or cancel this appointment please call [phone] as soon as possible. Thank you.  Thank you for entrusting me with your care and for choosing  Gastroenterology, Alan Coombs, P.A.-C

## 2023-10-27 LAB — IGG 4: IgG, Subclass 4: 65 mg/dL (ref 2–96)

## 2023-10-30 LAB — HEPATITIS B SURFACE ANTIGEN: Hepatitis B Surface Ag: NONREACTIVE

## 2023-10-30 LAB — ANA: Anti Nuclear Antibody (ANA): POSITIVE — AB

## 2023-10-30 LAB — IGG: IgG (Immunoglobin G), Serum: 1059 mg/dL (ref 600–1640)

## 2023-10-30 LAB — HEPATITIS B SURFACE ANTIBODY,QUALITATIVE: Hep B S Ab: NONREACTIVE

## 2023-10-30 LAB — IGA: Immunoglobulin A: 356 mg/dL — ABNORMAL HIGH (ref 47–310)

## 2023-10-30 LAB — HEPATITIS C ANTIBODY: Hepatitis C Ab: NONREACTIVE

## 2023-10-30 LAB — ANTI-NUCLEAR AB-TITER (ANA TITER)
ANA TITER: 1:80 {titer} — ABNORMAL HIGH
ANA Titer 1: 1:320 {titer} — ABNORMAL HIGH

## 2023-10-30 LAB — ANTI-SMOOTH MUSCLE ANTIBODY, IGG: Actin (Smooth Muscle) Antibody (IGG): <20 U (ref <20)

## 2023-10-30 LAB — MITOCHONDRIAL ANTIBODIES: Mitochondrial M2 Ab, IgG: <=20 U (ref <=20.0)

## 2023-10-30 LAB — TISSUE TRANSGLUTAMINASE, IGA: (tTG) Ab, IgA: <1 U/mL

## 2023-10-30 LAB — HEPATITIS A ANTIBODY, TOTAL: Hepatitis A AB,Total: NONREACTIVE

## 2023-11-03 ENCOUNTER — Encounter: Payer: Self-pay | Admitting: Family Medicine

## 2023-11-03 ENCOUNTER — Ambulatory Visit: Payer: Self-pay | Admitting: Physician Assistant

## 2023-11-03 ENCOUNTER — Ambulatory Visit: Payer: Self-pay | Admitting: General Surgery

## 2023-11-06 ENCOUNTER — Other Ambulatory Visit: Payer: Self-pay | Admitting: Family

## 2023-11-06 DIAGNOSIS — R768 Other specified abnormal immunological findings in serum: Secondary | ICD-10-CM

## 2023-11-23 NOTE — Telephone Encounter (Signed)
 Copied from CRM [id number]. Topic: General - Other >> Nov 20, 2023  1:31 PM Martinique E wrote: Reason for CRM: Katheryn from [facility] rheumatology needs patient's lab results (ANA) and previous rheumatology records. Callback number for Katheryn is [phone].

## 2023-11-26 ENCOUNTER — Telehealth: Payer: Self-pay

## 2023-11-26 NOTE — Telephone Encounter (Signed)
 ANA result faxed to [facility].

## 2023-11-26 NOTE — Telephone Encounter (Signed)
 Copied from CRM [id number]. Topic: Referral - Status >> Nov 26, 2023 11:10 AM Laymon HERO wrote: Reason for CRM: [facility]-Fax #[phone] they received a referral and they did not get lab results for ANA

## 2023-11-27 NOTE — Telephone Encounter (Signed)
 Returned [facility] Miles Rheumatology office call at [phone] and the office is closed on Friday. Will try another day.

## 2023-12-02 NOTE — Telephone Encounter (Signed)
 Spoke w/ Casey Miles and she stated their office received everything and pt is scheduled for 01/07/24.

## 2023-12-17 NOTE — Progress Notes (Addendum)
 SDW CALL  Patient was given pre-op instructions over the phone. The opportunity was given for the patient to ask questions. No further questions asked. Patient verbalized understanding of instructions given.  Date & arrival time- December 21, 2023 @ 9:30  PCP - Domenica Harlene LABOR, MD  Cardiologist - Jeffrie Oneil BROCKS, MD (LOV 06-04-23 per noted he is to follow up in two years)  PPM/ICD - denies Device Orders - n/a Rep Notified - n/a  Chest x-ray - 09-06-23 EKG - 09-06-23 Stress Test - 2011 ECHO - 10-19-13 Cardiac Cath - 2012  Sleep Study - denies CPAP - n/a  Dm -denies  Blood Thinner Instructions: denies Aspirin  Instructions:denies  ERAS Protcol - clear liquids until 9:00 am   COVID TEST- n/a   Anesthesia review: no  Patient denies shortness of breath, fever, cough and chest pain over the phone call   All instructions explained to the patient, with a verbal understanding of the material. Patient agrees to go over the instructions while at home for a better understanding.

## 2023-12-18 ENCOUNTER — Other Ambulatory Visit: Payer: Self-pay

## 2023-12-18 ENCOUNTER — Encounter (HOSPITAL_COMMUNITY): Payer: Self-pay | Admitting: General Surgery

## 2023-12-21 ENCOUNTER — Encounter (HOSPITAL_COMMUNITY)
Admission: RE | Disposition: A | Discharge status: Home / Self Care | Payer: Self-pay | Source: Home / Self Care | Attending: General Surgery

## 2023-12-21 ENCOUNTER — Ambulatory Visit (HOSPITAL_COMMUNITY)
Admission: RE | Admit: 2023-12-21 | Discharge: 2023-12-21 | Disposition: A | Discharge status: Home / Self Care | Attending: General Surgery | Admitting: General Surgery

## 2023-12-21 ENCOUNTER — Ambulatory Visit (HOSPITAL_COMMUNITY): Payer: Self-pay | Admitting: Anesthesiology

## 2023-12-21 ENCOUNTER — Encounter (HOSPITAL_COMMUNITY): Payer: Self-pay | Admitting: General Surgery

## 2023-12-21 DIAGNOSIS — K660 Peritoneal adhesions (postprocedural) (postinfection): Secondary | ICD-10-CM | POA: Diagnosis not present

## 2023-12-21 DIAGNOSIS — K219 Gastro-esophageal reflux disease without esophagitis: Secondary | ICD-10-CM | POA: Insufficient documentation

## 2023-12-21 DIAGNOSIS — Z9049 Acquired absence of other specified parts of digestive tract: Secondary | ICD-10-CM | POA: Diagnosis not present

## 2023-12-21 DIAGNOSIS — K432 Incisional hernia without obstruction or gangrene: Secondary | ICD-10-CM | POA: Diagnosis present

## 2023-12-21 DIAGNOSIS — I2510 Atherosclerotic heart disease of native coronary artery without angina pectoris: Secondary | ICD-10-CM | POA: Diagnosis not present

## 2023-12-21 DIAGNOSIS — I10 Essential (primary) hypertension: Secondary | ICD-10-CM | POA: Insufficient documentation

## 2023-12-21 HISTORY — PX: VENTRAL HERNIA REPAIR: SHX424

## 2023-12-21 HISTORY — DX: Personal history of urinary calculi: Z87.442

## 2023-12-21 HISTORY — PX: INSERTION OF MESH: SHX5868

## 2023-12-21 LAB — CBC
HCT: 42.2 % (ref 39.0–52.0)
Hemoglobin: 14.7 g/dL (ref 13.0–17.0)
MCH: 32.2 pg (ref 26.0–34.0)
MCHC: 34.8 g/dL (ref 30.0–36.0)
MCV: 92.3 fL (ref 80.0–100.0)
Platelets: 201 K/uL (ref 150–400)
RBC: 4.57 MIL/uL (ref 4.22–5.81)
RDW: 12.5 % (ref 11.5–15.5)
WBC: 5.4 K/uL (ref 4.0–10.5)
nRBC: 0 % (ref 0.0–0.2)

## 2023-12-21 LAB — BASIC METABOLIC PANEL WITH GFR
Anion gap: 11 (ref 5–15)
BUN: 12 mg/dL (ref 6–20)
CO2: 22 mmol/L (ref 22–32)
Calcium: 8.9 mg/dL (ref 8.9–10.3)
Chloride: 107 mmol/L (ref 98–111)
Creatinine, Ser: 1.11 mg/dL (ref 0.61–1.24)
GFR, Estimated: >60 mL/min (ref >60)
Glucose, Bld: 111 mg/dL — ABNORMAL HIGH (ref 70–99)
Potassium: 4.1 mmol/L (ref 3.5–5.1)
Sodium: 140 mmol/L (ref 135–145)

## 2023-12-21 SURGERY — REPAIR, HERNIA, VENTRAL, LAPAROSCOPIC
Anesthesia: Monitor Anesthesia Care | Site: Abdomen

## 2023-12-21 MED ORDER — MIDAZOLAM HCL 2 MG/2ML IJ SOLN
INTRAMUSCULAR | Status: AC
  Filled 2023-12-21: qty 2

## 2023-12-21 MED ORDER — MEPERIDINE HCL 25 MG/ML IJ SOLN: 6.2500 mg | INTRAMUSCULAR | Status: DC | PRN

## 2023-12-21 MED ORDER — PHENYLEPHRINE 80 MCG/ML (10ML) SYRINGE FOR IV PUSH (FOR BLOOD PRESSURE SUPPORT)
PREFILLED_SYRINGE | INTRAVENOUS | Status: DC | PRN
  Administered 2023-12-21 (×2): 80 ug via INTRAVENOUS

## 2023-12-21 MED ORDER — ACETAMINOPHEN 500 MG PO TABS
1000.0000 mg | ORAL_TABLET | ORAL | Status: AC
Start: 2023-12-21 — End: 2023-12-21
  Administered 2023-12-21: 1000 mg via ORAL
  Filled 2023-12-21: qty 2

## 2023-12-21 MED ORDER — FENTANYL CITRATE (PF) 250 MCG/5ML IJ SOLN
INTRAMUSCULAR | Status: AC
  Filled 2023-12-21: qty 5

## 2023-12-21 MED ORDER — CEFAZOLIN SODIUM-DEXTROSE 2-4 GM/100ML-% IV SOLN
2.0000 g | INTRAVENOUS | Status: AC
Start: 2023-12-21 — End: 2023-12-21
  Administered 2023-12-21: 2 g via INTRAVENOUS
  Filled 2023-12-21: qty 100

## 2023-12-21 MED ORDER — MIDAZOLAM HCL (PF) 2 MG/2ML IJ SOLN
INTRAMUSCULAR | Status: DC | PRN
  Administered 2023-12-21: 2 mg via INTRAVENOUS

## 2023-12-21 MED ORDER — SUGAMMADEX SODIUM 200 MG/2ML IV SOLN
INTRAVENOUS | Status: DC | PRN
  Administered 2023-12-21: 400 mg via INTRAVENOUS

## 2023-12-21 MED ORDER — FENTANYL CITRATE (PF) 250 MCG/5ML IJ SOLN
INTRAMUSCULAR | Status: DC | PRN
  Administered 2023-12-21 (×3): 50 ug via INTRAVENOUS
  Administered 2023-12-21: 100 ug via INTRAVENOUS

## 2023-12-21 MED ORDER — CHLORHEXIDINE GLUCONATE CLOTH 2 % EX PADS: 6.0000 | MEDICATED_PAD | Freq: Once | CUTANEOUS | Status: DC

## 2023-12-21 MED ORDER — BUPIVACAINE-EPINEPHRINE 0.25% -1:200000 IJ SOLN
INTRAMUSCULAR | Status: DC | PRN
  Administered 2023-12-21: 10 mL

## 2023-12-21 MED ORDER — DEXAMETHASONE SOD PHOSPHATE PF 10 MG/ML IJ SOLN
INTRAMUSCULAR | Status: DC | PRN
  Administered 2023-12-21: 10 mg via INTRAVENOUS

## 2023-12-21 MED ORDER — OXYCODONE HCL 5 MG PO TABS: 5.0000 mg | ORAL_TABLET | Freq: Four times a day (QID) | ORAL | 0 refills | Status: AC | PRN

## 2023-12-21 MED ORDER — PROPOFOL 10 MG/ML IV BOLUS
INTRAVENOUS | Status: AC
Start: 2023-12-21 — End: 2023-12-21
  Filled 2023-12-21: qty 20

## 2023-12-21 MED ORDER — ONDANSETRON HCL 4 MG/2ML IJ SOLN
INTRAMUSCULAR | Status: DC | PRN
  Administered 2023-12-21: 4 mg via INTRAVENOUS

## 2023-12-21 MED ORDER — PROPOFOL 10 MG/ML IV BOLUS
INTRAVENOUS | Status: DC | PRN
  Administered 2023-12-21: 200 mg via INTRAVENOUS

## 2023-12-21 MED ORDER — ROCURONIUM BROMIDE 10 MG/ML (PF) SYRINGE
PREFILLED_SYRINGE | INTRAVENOUS | Status: DC | PRN
  Administered 2023-12-21: 60 mg via INTRAVENOUS

## 2023-12-21 MED ORDER — BUPIVACAINE-EPINEPHRINE (PF) 0.25% -1:200000 IJ SOLN
INTRAMUSCULAR | Status: AC
  Filled 2023-12-21: qty 30

## 2023-12-21 MED ORDER — ONDANSETRON HCL 4 MG/2ML IJ SOLN
INTRAMUSCULAR | Status: AC
  Filled 2023-12-21: qty 2

## 2023-12-21 MED ORDER — LACTATED RINGERS IV SOLN: INTRAVENOUS | Status: DC

## 2023-12-21 MED ORDER — ONDANSETRON HCL 4 MG/2ML IJ SOLN
4.0000 mg | Freq: Once | INTRAMUSCULAR | Status: AC | PRN
  Administered 2023-12-21: 4 mg via INTRAVENOUS

## 2023-12-21 MED ORDER — FENTANYL CITRATE (PF) 100 MCG/2ML IJ SOLN: 25.0000 ug | INTRAMUSCULAR | Status: DC | PRN

## 2023-12-21 MED ORDER — LIDOCAINE 2% (20 MG/ML) 5 ML SYRINGE
INTRAMUSCULAR | Status: DC | PRN
  Administered 2023-12-21: 100 mg via INTRAVENOUS

## 2023-12-21 MED ORDER — OXYCODONE HCL 5 MG PO TABS: 5.0000 mg | ORAL_TABLET | Freq: Once | ORAL | Status: DC | PRN

## 2023-12-21 MED ORDER — GABAPENTIN 100 MG PO CAPS
100.0000 mg | ORAL_CAPSULE | ORAL | Status: AC
  Administered 2023-12-21: 100 mg via ORAL
  Filled 2023-12-21: qty 1

## 2023-12-21 MED ORDER — LACTATED RINGERS IV SOLN: INTRAVENOUS | Status: DC | PRN

## 2023-12-21 MED ORDER — CHLORHEXIDINE GLUCONATE 0.12 % MT SOLN
15.0000 mL | OROMUCOSAL | Status: AC
  Administered 2023-12-21: 15 mL via OROMUCOSAL
  Filled 2023-12-21 (×2): qty 15

## 2023-12-21 MED ORDER — 0.9 % SODIUM CHLORIDE (POUR BTL) OPTIME
TOPICAL | Status: DC | PRN
  Administered 2023-12-21: 1000 mL

## 2023-12-21 MED ORDER — OXYCODONE HCL 5 MG/5ML PO SOLN: 5.0000 mg | Freq: Once | ORAL | Status: DC | PRN

## 2023-12-21 SURGICAL SUPPLY — 31 items
BINDER ABDOMINAL 12 ML 46-62 (SOFTGOODS) IMPLANT
CHLORAPREP W/TINT 26 (MISCELLANEOUS) ×1 IMPLANT
COVER SURGICAL LIGHT HANDLE (MISCELLANEOUS) ×1 IMPLANT
DERMABOND ADVANCED .7 DNX12 (GAUZE/BANDAGES/DRESSINGS) ×1 IMPLANT
DEVICE SECURE STRAP 25 ABSORB (INSTRUMENTS) ×1 IMPLANT
DRAPE INCISE IOBAN 66X45 STRL (DRAPES) ×1 IMPLANT
ELECT CAUTERY BLADE 6.4 (BLADE) ×1 IMPLANT
ELECTRODE REM PT RTRN 9FT ADLT (ELECTROSURGICAL) ×1 IMPLANT
GLOVE BIO SURGEON STRL SZ7.5 (GLOVE) ×1 IMPLANT
GOWN STRL REUS W/ TWL LRG LVL3 (GOWN DISPOSABLE) ×3 IMPLANT
GRASPER SUT TROCAR 14GX15 (MISCELLANEOUS) IMPLANT
KIT BASIN OR (CUSTOM PROCEDURE TRAY) ×1 IMPLANT
KIT TURNOVER KIT B (KITS) ×1 IMPLANT
MARKER SKIN DUAL TIP RULER LAB (MISCELLANEOUS) ×1 IMPLANT
MESH OVITEX CORE PERM 12X12 4L (Mesh General) IMPLANT
NDL SPNL 22GX3.5 QUINCKE BK (NEEDLE) ×1 IMPLANT
NEEDLE SPNL 22GX3.5 QUINCKE BK (NEEDLE) ×1 IMPLANT
PAD ARMBOARD POSITIONER FOAM (MISCELLANEOUS) ×2 IMPLANT
PENCIL BUTTON HOLSTER BLD 10FT (ELECTRODE) ×1 IMPLANT
SET TUBE SMOKE EVAC HIGH FLOW (TUBING) ×1 IMPLANT
SHEARS HARMONIC 36 ACE (MISCELLANEOUS) IMPLANT
SLEEVE Z-THREAD 5X100MM (TROCAR) ×1 IMPLANT
SOLN 0.9% NACL POUR BTL 1000ML (IV SOLUTION) ×1 IMPLANT
SOLN STERILE WATER BTL 1000 ML (IV SOLUTION) ×1 IMPLANT
SUT MNCRL AB 4-0 PS2 18 (SUTURE) ×1 IMPLANT
SUT NOVA NAB DX-16 0-1 5-0 T12 (SUTURE) ×1 IMPLANT
SUT VIC AB 3-0 SH 27XBRD (SUTURE) ×1 IMPLANT
TOWEL GREEN STERILE (TOWEL DISPOSABLE) ×1 IMPLANT
TRAY LAPAROSCOPIC MC (CUSTOM PROCEDURE TRAY) ×1 IMPLANT
TROCAR Z-THREAD OPTICAL 5X100M (TROCAR) ×1 IMPLANT
WARMER LAPAROSCOPE (MISCELLANEOUS) ×1 IMPLANT

## 2023-12-21 NOTE — Anesthesia Preprocedure Evaluation (Addendum)
 Anesthesia Evaluation  Patient identified by MRN, date of birth, ID band Patient awake    Reviewed: Allergy & Precautions, NPO status , Patient's Chart, lab work & pertinent test results  History of Anesthesia Complications Negative for: history of anesthetic complications  Airway Mallampati: II  TM Distance: >3 FB Neck ROM: Full    Dental no notable dental hx.    Pulmonary neg pulmonary ROS   Pulmonary exam normal        Cardiovascular hypertension, + CAD  Normal cardiovascular exam     Neuro/Psych   Anxiety        GI/Hepatic ,GERD  ,,Recurrent pancreatitis   Endo/Other  negative endocrine ROS    Renal/GU negative Renal ROS     Musculoskeletal negative musculoskeletal ROS (+)    Abdominal   Peds  Hematology negative hematology ROS (+)   Anesthesia Other Findings Day of surgery medications reviewed with patient.  Reproductive/Obstetrics                              Anesthesia Physical Anesthesia Plan  ASA: 2  Anesthesia Plan: MAC and General   Post-op Pain Management: Minimal or no pain anticipated, Tylenol  PO (pre-op)* and Celebrex PO (pre-op)*   Induction: Intravenous  PONV Risk Score and Plan: 1 and Ondansetron , Dexamethasone  and Treatment may vary due to age or medical condition  Airway Management Planned: Oral ETT  Additional Equipment: None  Intra-op Plan:   Post-operative Plan: Extubation in OR  Informed Consent: I have reviewed the patients History and Physical, chart, labs and discussed the procedure including the risks, benefits and alternatives for the proposed anesthesia with the patient or authorized representative who has indicated his/her understanding and acceptance.       Plan Discussed with: CRNA and Anesthesiologist  Anesthesia Plan Comments: (  History of Present Illness: Casey Miles is a 58 y.o. male who is seen today for ventral hernia. The  patient is a 58 year old white male who has a history of laparoscopic cholecystectomy. Sometime after that surgery was performed he developed some swelling just above his umbilicus. He underwent a CT scan last year that did show a ventral hernia in the supraumbilical area containing only some fat. He denies any nausea or vomiting. He was unable to schedule last year but is ready to schedule now. He has had some episodes of pancreatitis since his lap Chole was completed the source of which is unknown as of yet.   )         Anesthesia Quick Evaluation

## 2023-12-21 NOTE — H&P (Signed)
 MRN: [id number] DOB: 06/22/1965 Subjective   Chief Complaint: Follow-up   History of Present Illness: Casey Miles is a 58 y.o. male who is seen today for ventral hernia. The patient is a 58 year old white male who has a history of laparoscopic cholecystectomy. Sometime after that surgery was performed he developed some swelling just above his umbilicus. He underwent a CT scan last year that did show a ventral hernia in the supraumbilical area containing only some fat. He denies any nausea or vomiting. He was unable to schedule last year but is ready to schedule now. He has had some episodes of pancreatitis since his lap Chole was completed the source of which is unknown as of yet.    Review of Systems: A complete review of systems was obtained from the patient. I have reviewed this information and discussed as appropriate with the patient. See HPI as well for other ROS.  ROS   Medical History: History reviewed. No pertinent past medical history.  Patient Active Problem List  Diagnosis  Status post laparoscopic cholecystectomy  Incisional hernia without obstruction or gangrene   Past Surgical History:  Procedure Laterality Date  LAPAROSCOPIC CHOLECYSTECTOMY    No Known Allergies  Current Outpatient Medications on File Prior to Visit  Medication Sig Dispense Refill  ergocalciferol , vitamin D2, 1,250 mcg (50,000 unit) capsule Take 50,000 Units by mouth every 7 (seven) days  rosuvastatin  (CRESTOR ) 40 MG tablet Take 40 mg by mouth once daily   No current facility-administered medications on file prior to visit.   Family History  Problem Relation Age of Onset  High blood pressure (Hypertension) Mother  Diabetes Mother  Coronary Artery Disease (Blocked arteries around heart) Father    Social History   Tobacco Use  Smoking Status Never  Smokeless Tobacco Never    Social History   Socioeconomic History  Marital status: Married  Tobacco Use  Smoking status: Never   Smokeless tobacco: Never  Substance and Sexual Activity  Alcohol use: Not Currently  Drug use: Never   Social Drivers of Health   Food Insecurity: No Food Insecurity (09/07/2023)  Received from [facility]  Hunger Vital Sign  Within the past 12 months, you worried that your food would run out before you got the money to buy more.: Never true  Within the past 12 months, the food you bought just didn't last and you didn't have money to get more.: Never true  Transportation Needs: No Transportation Needs (09/07/2023)  Received from [facility] - Transportation  In the past 12 months, has lack of transportation kept you from medical appointments or from getting medications?: No  In the past 12 months, has lack of transportation kept you from meetings, work, or from getting things needed for daily living?: No  Housing Stability: Unknown (11/03/2023)  Housing Stability Vital Sign  Homeless in the Last Year: No   Objective:   Vitals:  PainSc: 0-No pain   There is no height or weight on file to calculate BMI.  Physical Exam Constitutional:  General: He is not in acute distress. Appearance: Normal appearance.  HENT:  Head: Normocephalic and atraumatic.  Right Ear: External ear normal.  Left Ear: External ear normal.  Nose: Nose normal.  Mouth/Throat:  Mouth: Mucous membranes are moist.  Pharynx: Oropharynx is clear.  Eyes:  General: No scleral icterus. Extraocular Movements: Extraocular movements intact.  Conjunctiva/sclera: Conjunctivae normal.  Pupils: Pupils are equal, round, and reactive to light.  Cardiovascular:  Rate and Rhythm: Normal  rate and regular rhythm.  Pulses: Normal pulses.  Heart sounds: Normal heart sounds.  Pulmonary:  Effort: Pulmonary effort is normal. No respiratory distress.  Breath sounds: Normal breath sounds.  Abdominal:  General: Abdomen is flat. Bowel sounds are normal. There is no distension.  Palpations: Abdomen is soft.  Tenderness:  There is no abdominal tenderness.  Comments: There is a roughly 3 cm fascial defect just above the umbilicus. There is no sign of obstruction. The abdomen is soft and nontender  Musculoskeletal:  General: No swelling or deformity. Normal range of motion.  Cervical back: Normal range of motion and neck supple. No tenderness.  Skin: General: Skin is warm and dry.  Coloration: Skin is not jaundiced.  Neurological:  General: No focal deficit present.  Mental Status: He is alert and oriented to person, place, and time.  Psychiatric:  Mood and Affect: Mood normal.  Behavior: Behavior normal.     Labs, Imaging and Diagnostic Testing:  Assessment and Plan:   Diagnoses and all orders for this visit:  Incisional hernia without obstruction or gangrene - [facility] Case Posting Request; Future    The patient appears to have a ventral incisional hernia just above the umbilicus. Because of the risk of incarceration and strangulation I feel he would benefit from having this fixed. He would also like to have this done. I have discussed with him in detail the risks and benefits of the operation to fix the hernia as well as some of the technical aspects including the use of mesh and the risk of chronic pain and he understands and wishes to proceed. We will plan for a laparoscopic assisted ventral hernia repair with mesh

## 2023-12-21 NOTE — Op Note (Signed)
 12/21/2023  1:23 PM  PATIENT:  Casey Miles  58 y.o. male  PRE-OPERATIVE DIAGNOSIS:  VENTRAL HERNIA  POST-OPERATIVE DIAGNOSIS:  VENTRAL HERNIA  PROCEDURE:  Procedure(s) with comments: REPAIR, HERNIA, VENTRAL, LAPAROSCOPIC (N/A) - MESH  SURGEON:  Surgeons and Role:    * Curvin Deward MOULD, MD - Primary  PHYSICIAN ASSISTANT:   ASSISTANTS: Powell Seats, RNFA   ANESTHESIA:   local and general  EBL:  minimal   BLOOD ADMINISTERED:none  DRAINS: none   LOCAL MEDICATIONS USED:  MARCAINE      SPECIMEN:  No Specimen  DISPOSITION OF SPECIMEN:  N/A  COUNTS:  YES  TOURNIQUET:  * No tourniquets in log *  DICTATION: .Dragon Dictation  After informed consent was obtained the patient was brought to the operating room and placed in the supine position on the operating table.  After adequate induction of general anesthesia the patient's abdomen was prepped with ChloraPrep, allowed to dry, and draped in usual sterile manner, including the use of an Ioban drape.  An appropriate timeout was performed.  I elected to access the abdominal cavity in the right upper quadrant.  This area was infiltrated with quarter percent Marcaine .  A small stab incision was made with a 15 blade knife.  A 5 mm Optiview port and camera were used to dissected bluntly through the layers of the abdominal wall under direct vision until access was gained to the abdominal cavity.  The abdomen was then insufflated with carbon oxide without difficulty.  Another 5 mm port was placed in the right lower quadrant under direct vision after infiltrating the area with quarter percent Marcaine .  There was adhesions of omentum to the anterior abdominal wall.  These were taken down sharply with the harmonic scalpel.  There was no bowel adhesions to the abdominal wall.  Once this was accomplished we could see the hernia defect easily.  The defect measured about 4 cm in diameter.  There was an old piece of mesh noted just to be inferior to  this hernia from a previous umbilical hernia repair.  This mesh appeared to be intact and incorporated into the abdominal wall.  I chose a 12 cm piece of circular Ovitex LPR mesh.  I placed four #1 Novafil stitches around the edge of the mesh at equidistant points.  The mesh was oriented with the blue side towards the anterior abdominal wall.  I then made a small incision through the middle of the hernia with a 15 blade knife.  The incision was carried through the skin and subcutaneous tissue sharply with the electrocautery until the hernia sac was entered.  The hernia sac was then excised sharply with the electrocautery.  I then placed the mesh through this opening into the abdominal cavity with the appropriate orientation.  The fascial defect was closed with multiple interrupted #1 Novafil stitches.  The abdomen was then insufflated again.  I then made 4 small stab incisions at points that corresponded to the stitches at the edge of the mesh.  A suture passer was placed through the abdominal wall and used to grasp the tails of each stitch and bring them through the abdominal wall.  The stitches were then pulled up, cinched down and tied and the mesh was observed to be in good apposition to the anterior abdominal wall.  The gaps between the stitches were filled in with a secure strap tacker.  Once this was accomplished then there were no gaps or redundancy in the mesh and the  mesh continued to be in good apposition to the abdominal wall.  The rest of the abdomen was inspected and no other abnormalities were noted.  The area appeared to be completely hemostatic.  At this point the gas was allowed to escape and the mesh was observed to remain in good apposition to the abdominal wall.  The subcutaneous tissue of the incision over the hernia was closed with interrupted 3-0 Vicryl stitches.  The skin incisions were then closed with 4-0 Monocryl subcuticular stitches.  Dermabond dressings were applied.  The patient  tolerated the procedure well.  At the end of the case all needle sponge and instrument counts were correct.  The patient was then awakened and taken recovery in stable condition.  PLAN OF CARE: Discharge to home after PACU  PATIENT DISPOSITION:  PACU - hemodynamically stable.   Delay start of Pharmacological VTE agent (>24hrs) due to surgical blood loss or risk of bleeding: not applicable

## 2023-12-21 NOTE — Anesthesia Postprocedure Evaluation (Signed)
 Anesthesia Post Note  Patient: Casey Miles  Procedure(s) Performed: REPAIR, HERNIA, VENTRAL, LAPAROSCOPIC (Abdomen) INSERTION OF MESH (Abdomen)     Patient location during evaluation: PACU Anesthesia Type: General Level of consciousness: awake and alert Pain management: pain level controlled Vital Signs Assessment: post-procedure vital signs reviewed and stable Respiratory status: spontaneous breathing, nonlabored ventilation, respiratory function stable and patient connected to nasal cannula oxygen Cardiovascular status: blood pressure returned to baseline and stable Postop Assessment: no apparent nausea or vomiting Anesthetic complications: no   No notable events documented.  Last Vitals:  Vitals:   12/21/23 1400 12/21/23 1415  BP: (!) 143/89 132/88  Pulse: 74 78  Resp: 17 16  Temp:  36.8 C  SpO2: 94% 96%    Last Pain:  Vitals:   12/21/23 1337  TempSrc:   PainSc: Asleep                 Luma Clopper

## 2023-12-21 NOTE — Interval H&P Note (Signed)
 History and Physical Interval Note:  12/21/2023 11:09 AM  Casey Miles  has presented today for surgery, with the diagnosis of VENTRAL HERNIA.  The various methods of treatment have been discussed with the patient and family. After consideration of risks, benefits and other options for treatment, the patient has consented to  Procedure(s) with comments: REPAIR, HERNIA, VENTRAL, LAPAROSCOPIC (N/A) - MESH as a surgical intervention.  The patient's history has been reviewed, patient examined, no change in status, stable for surgery.  I have reviewed the patient's chart and labs.  Questions were answered to the patient's satisfaction.     Deward Null III

## 2023-12-21 NOTE — Transfer of Care (Signed)
 Immediate Anesthesia Transfer of Care Note  Patient: Casey Miles  Procedure(s) Performed: REPAIR, HERNIA, VENTRAL, LAPAROSCOPIC (Abdomen) INSERTION OF MESH (Abdomen)  Patient Location: PACU  Anesthesia Type:General  Level of Consciousness: awake, alert , oriented, and patient cooperative  Airway & Oxygen Therapy: Patient Spontanous Breathing and Patient connected to face mask oxygen  Post-op Assessment: Report given to RN, Post -op Vital signs reviewed and stable, Patient moving all extremities, and Patient moving all extremities X 4  Post vital signs: Reviewed and stable  Last Vitals:  Vitals Value Taken Time  BP 143/96 12/21/23 13:37  Temp    Pulse 77 12/21/23 13:45  Resp 14 12/21/23 13:45  SpO2 95 % 12/21/23 13:45  Vitals shown include unfiled device data.  Last Pain:  Vitals:   12/21/23 1006  TempSrc:   PainSc: 0-No pain      Patients Stated Pain Goal: 0 (12/21/23 1006)  Complications: No notable events documented.

## 2023-12-22 ENCOUNTER — Encounter (HOSPITAL_COMMUNITY): Payer: Self-pay | Admitting: General Surgery

## 2024-01-07 ENCOUNTER — Ambulatory Visit: Admitting: Gastroenterology

## 2024-01-21 ENCOUNTER — Encounter: Payer: No Typology Code available for payment source | Admitting: Student
